# Patient Record
Sex: Female | Born: 1965
Health system: Southern US, Community
[De-identification: ages and names within clinical notes are randomized; demographics above are authoritative.]

## PROBLEM LIST (undated history)

## (undated) DIAGNOSIS — K3184 Gastroparesis: Secondary | ICD-10-CM

## (undated) DIAGNOSIS — D638 Anemia in other chronic diseases classified elsewhere: Secondary | ICD-10-CM

## (undated) DIAGNOSIS — E119 Type 2 diabetes mellitus without complications: Secondary | ICD-10-CM

## (undated) DIAGNOSIS — E0500 Thyrotoxicosis with diffuse goiter without thyrotoxic crisis or storm: Secondary | ICD-10-CM

## (undated) DIAGNOSIS — I10 Essential (primary) hypertension: Secondary | ICD-10-CM

## (undated) DIAGNOSIS — K589 Irritable bowel syndrome without diarrhea: Secondary | ICD-10-CM

## (undated) HISTORY — PX: ABDOMINAL HYSTERECTOMY: SHX81

## (undated) HISTORY — DX: Irritable bowel syndrome, unspecified: K58.9

## (undated) HISTORY — PX: PLANTAR FASCIA SURGERY: SHX746

## (undated) HISTORY — DX: Gastroparesis: K31.84

## (undated) HISTORY — DX: Type 2 diabetes mellitus without complications: E11.9

## (undated) HISTORY — DX: Thyrotoxicosis with diffuse goiter without thyrotoxic crisis or storm: E05.00

## (undated) HISTORY — PX: CATARACT EXTRACTION: SUR2

## (undated) HISTORY — DX: Anemia in other chronic diseases classified elsewhere: D63.8

## (undated) HISTORY — PX: CHOLECYSTECTOMY: SHX55

## (undated) HISTORY — PX: TUBAL LIGATION: SHX77

## (undated) HISTORY — PX: TONSILLECTOMY: SUR1361

## (undated) HISTORY — PX: OTHER SURGICAL HISTORY: SHX169

---

## 2000-09-09 ENCOUNTER — Emergency Department (HOSPITAL_COMMUNITY): Admission: EM | Admit: 2000-09-09 | Discharge: 2000-09-10 | Payer: Self-pay | Admitting: *Deleted

## 2001-05-11 ENCOUNTER — Emergency Department (HOSPITAL_COMMUNITY): Admission: EM | Admit: 2001-05-11 | Discharge: 2001-05-11 | Payer: Self-pay | Admitting: Internal Medicine

## 2001-06-10 ENCOUNTER — Emergency Department (HOSPITAL_COMMUNITY): Admission: EM | Admit: 2001-06-10 | Discharge: 2001-06-10 | Payer: Self-pay | Admitting: Emergency Medicine

## 2001-07-19 ENCOUNTER — Ambulatory Visit (HOSPITAL_COMMUNITY): Admission: RE | Admit: 2001-07-19 | Discharge: 2001-07-19 | Payer: Self-pay | Admitting: Internal Medicine

## 2001-07-19 ENCOUNTER — Encounter: Payer: Self-pay | Admitting: Internal Medicine

## 2001-07-26 ENCOUNTER — Encounter: Payer: Self-pay | Admitting: Internal Medicine

## 2001-07-26 ENCOUNTER — Ambulatory Visit (HOSPITAL_COMMUNITY): Admission: RE | Admit: 2001-07-26 | Discharge: 2001-07-26 | Payer: Self-pay | Admitting: Internal Medicine

## 2001-12-04 ENCOUNTER — Encounter: Admission: RE | Admit: 2001-12-04 | Discharge: 2001-12-04 | Payer: Self-pay | Admitting: Internal Medicine

## 2001-12-04 ENCOUNTER — Encounter: Payer: Self-pay | Admitting: Internal Medicine

## 2002-08-09 ENCOUNTER — Encounter: Payer: Self-pay | Admitting: *Deleted

## 2002-08-09 ENCOUNTER — Ambulatory Visit (HOSPITAL_COMMUNITY): Admission: RE | Admit: 2002-08-09 | Discharge: 2002-08-09 | Payer: Self-pay | Admitting: *Deleted

## 2002-09-11 ENCOUNTER — Inpatient Hospital Stay (HOSPITAL_COMMUNITY): Admission: RE | Admit: 2002-09-11 | Discharge: 2002-09-13 | Payer: Self-pay | Admitting: *Deleted

## 2003-12-27 ENCOUNTER — Emergency Department (HOSPITAL_COMMUNITY): Admission: EM | Admit: 2003-12-27 | Discharge: 2003-12-27 | Payer: Self-pay | Admitting: Emergency Medicine

## 2004-01-15 ENCOUNTER — Emergency Department (HOSPITAL_COMMUNITY): Admission: EM | Admit: 2004-01-15 | Discharge: 2004-01-16 | Payer: Self-pay | Admitting: *Deleted

## 2004-01-24 ENCOUNTER — Emergency Department (HOSPITAL_COMMUNITY): Admission: EM | Admit: 2004-01-24 | Discharge: 2004-01-24 | Payer: Self-pay | Admitting: Emergency Medicine

## 2004-01-25 HISTORY — PX: ESOPHAGOGASTRODUODENOSCOPY: SHX1529

## 2004-01-27 ENCOUNTER — Emergency Department (HOSPITAL_COMMUNITY): Admission: EM | Admit: 2004-01-27 | Discharge: 2004-01-28 | Payer: Self-pay | Admitting: Emergency Medicine

## 2004-01-31 ENCOUNTER — Inpatient Hospital Stay (HOSPITAL_COMMUNITY): Admission: AD | Admit: 2004-01-31 | Discharge: 2004-02-05 | Payer: Self-pay | Admitting: Family Medicine

## 2004-02-22 ENCOUNTER — Emergency Department (HOSPITAL_COMMUNITY): Admission: EM | Admit: 2004-02-22 | Discharge: 2004-02-22 | Payer: Self-pay | Admitting: Emergency Medicine

## 2004-03-06 ENCOUNTER — Ambulatory Visit: Payer: Self-pay | Admitting: Internal Medicine

## 2004-03-06 ENCOUNTER — Inpatient Hospital Stay (HOSPITAL_COMMUNITY): Admission: AD | Admit: 2004-03-06 | Discharge: 2004-03-12 | Payer: Self-pay | Admitting: Family Medicine

## 2004-03-22 ENCOUNTER — Emergency Department (HOSPITAL_COMMUNITY): Admission: EM | Admit: 2004-03-22 | Discharge: 2004-03-22 | Payer: Self-pay | Admitting: Emergency Medicine

## 2004-03-24 ENCOUNTER — Encounter (HOSPITAL_COMMUNITY): Admission: RE | Admit: 2004-03-24 | Discharge: 2004-04-23 | Payer: Self-pay | Admitting: Internal Medicine

## 2004-03-26 HISTORY — PX: OTHER SURGICAL HISTORY: SHX169

## 2004-03-27 ENCOUNTER — Ambulatory Visit (HOSPITAL_COMMUNITY): Admission: RE | Admit: 2004-03-27 | Discharge: 2004-03-27 | Payer: Self-pay | Admitting: Internal Medicine

## 2004-04-02 ENCOUNTER — Ambulatory Visit (HOSPITAL_COMMUNITY): Admission: RE | Admit: 2004-04-02 | Discharge: 2004-04-02 | Payer: Self-pay | Admitting: Internal Medicine

## 2004-04-06 ENCOUNTER — Observation Stay (HOSPITAL_COMMUNITY): Admission: EM | Admit: 2004-04-06 | Discharge: 2004-04-06 | Payer: Self-pay | Admitting: Emergency Medicine

## 2004-04-24 ENCOUNTER — Encounter (HOSPITAL_COMMUNITY): Admission: RE | Admit: 2004-04-24 | Discharge: 2004-05-24 | Payer: Self-pay | Admitting: Nephrology

## 2004-05-01 ENCOUNTER — Ambulatory Visit (HOSPITAL_COMMUNITY): Payer: Self-pay | Admitting: Oncology

## 2004-05-04 ENCOUNTER — Ambulatory Visit: Payer: Self-pay | Admitting: Internal Medicine

## 2004-05-21 ENCOUNTER — Ambulatory Visit (HOSPITAL_COMMUNITY): Payer: Self-pay | Admitting: Internal Medicine

## 2004-05-27 ENCOUNTER — Emergency Department (HOSPITAL_COMMUNITY): Admission: EM | Admit: 2004-05-27 | Discharge: 2004-05-27 | Payer: Self-pay | Admitting: Emergency Medicine

## 2004-05-28 ENCOUNTER — Encounter (HOSPITAL_COMMUNITY): Admission: RE | Admit: 2004-05-28 | Discharge: 2004-06-27 | Payer: Self-pay | Admitting: Nephrology

## 2004-06-02 ENCOUNTER — Emergency Department (HOSPITAL_COMMUNITY): Admission: EM | Admit: 2004-06-02 | Discharge: 2004-06-03 | Payer: Self-pay | Admitting: Emergency Medicine

## 2004-06-04 ENCOUNTER — Ambulatory Visit (HOSPITAL_COMMUNITY): Payer: Self-pay | Admitting: Nephrology

## 2004-06-19 ENCOUNTER — Ambulatory Visit: Payer: Self-pay | Admitting: Internal Medicine

## 2004-07-03 ENCOUNTER — Encounter (HOSPITAL_COMMUNITY): Admission: RE | Admit: 2004-07-03 | Discharge: 2004-08-02 | Payer: Self-pay | Admitting: Nephrology

## 2004-07-28 ENCOUNTER — Ambulatory Visit (HOSPITAL_COMMUNITY): Payer: Self-pay | Admitting: Oncology

## 2004-08-05 ENCOUNTER — Ambulatory Visit (HOSPITAL_COMMUNITY): Payer: Self-pay | Admitting: Nephrology

## 2004-08-05 ENCOUNTER — Encounter (HOSPITAL_COMMUNITY): Admission: RE | Admit: 2004-08-05 | Discharge: 2004-09-04 | Payer: Self-pay | Admitting: Nephrology

## 2004-09-10 ENCOUNTER — Encounter (HOSPITAL_COMMUNITY): Admission: RE | Admit: 2004-09-10 | Discharge: 2004-10-10 | Payer: Self-pay | Admitting: Nephrology

## 2004-10-08 ENCOUNTER — Ambulatory Visit (HOSPITAL_COMMUNITY): Payer: Self-pay | Admitting: Nephrology

## 2004-10-21 ENCOUNTER — Encounter (HOSPITAL_COMMUNITY): Admission: RE | Admit: 2004-10-21 | Discharge: 2004-11-20 | Payer: Self-pay | Admitting: Nephrology

## 2004-12-11 ENCOUNTER — Ambulatory Visit (HOSPITAL_COMMUNITY): Payer: Self-pay | Admitting: Nephrology

## 2004-12-11 ENCOUNTER — Encounter (HOSPITAL_COMMUNITY): Admission: RE | Admit: 2004-12-11 | Discharge: 2005-01-10 | Payer: Self-pay | Admitting: Oncology

## 2005-01-08 ENCOUNTER — Ambulatory Visit: Payer: Self-pay | Admitting: Internal Medicine

## 2005-01-15 ENCOUNTER — Encounter (HOSPITAL_COMMUNITY): Admission: RE | Admit: 2005-01-15 | Discharge: 2005-01-23 | Payer: Self-pay | Admitting: Nephrology

## 2005-02-03 ENCOUNTER — Encounter (HOSPITAL_COMMUNITY): Admission: RE | Admit: 2005-02-03 | Discharge: 2005-03-05 | Payer: Self-pay | Admitting: Oncology

## 2005-02-19 ENCOUNTER — Ambulatory Visit (HOSPITAL_COMMUNITY): Payer: Self-pay | Admitting: Nephrology

## 2005-03-17 ENCOUNTER — Encounter (HOSPITAL_COMMUNITY): Admission: RE | Admit: 2005-03-17 | Discharge: 2005-04-16 | Payer: Self-pay | Admitting: Nephrology

## 2005-04-07 ENCOUNTER — Ambulatory Visit: Payer: Self-pay | Admitting: Internal Medicine

## 2005-04-15 ENCOUNTER — Ambulatory Visit (HOSPITAL_COMMUNITY): Payer: Self-pay | Admitting: Nephrology

## 2005-04-30 ENCOUNTER — Encounter (HOSPITAL_COMMUNITY): Admission: RE | Admit: 2005-04-30 | Discharge: 2005-05-30 | Payer: Self-pay | Admitting: Oncology

## 2005-06-11 ENCOUNTER — Ambulatory Visit (HOSPITAL_COMMUNITY): Payer: Self-pay | Admitting: Nephrology

## 2005-06-11 ENCOUNTER — Encounter (HOSPITAL_COMMUNITY): Admission: RE | Admit: 2005-06-11 | Discharge: 2005-07-11 | Payer: Self-pay | Admitting: Oncology

## 2005-07-23 ENCOUNTER — Encounter (HOSPITAL_COMMUNITY): Admission: RE | Admit: 2005-07-23 | Discharge: 2005-08-22 | Payer: Self-pay | Admitting: Nephrology

## 2005-07-26 ENCOUNTER — Emergency Department (HOSPITAL_COMMUNITY): Admission: EM | Admit: 2005-07-26 | Discharge: 2005-07-26 | Payer: Self-pay | Admitting: Emergency Medicine

## 2005-08-20 ENCOUNTER — Ambulatory Visit (HOSPITAL_COMMUNITY): Payer: Self-pay | Admitting: Nephrology

## 2005-08-22 ENCOUNTER — Emergency Department (HOSPITAL_COMMUNITY): Admission: EM | Admit: 2005-08-22 | Discharge: 2005-08-23 | Payer: Self-pay | Admitting: Emergency Medicine

## 2005-09-03 ENCOUNTER — Encounter (HOSPITAL_COMMUNITY): Admission: RE | Admit: 2005-09-03 | Discharge: 2005-10-03 | Payer: Self-pay | Admitting: Oncology

## 2005-10-04 ENCOUNTER — Ambulatory Visit: Payer: Self-pay | Admitting: Internal Medicine

## 2005-10-15 ENCOUNTER — Encounter (HOSPITAL_COMMUNITY): Admission: RE | Admit: 2005-10-15 | Discharge: 2005-11-14 | Payer: Self-pay | Admitting: Oncology

## 2005-10-29 ENCOUNTER — Ambulatory Visit (HOSPITAL_COMMUNITY): Payer: Self-pay | Admitting: Nephrology

## 2005-11-19 ENCOUNTER — Encounter (HOSPITAL_COMMUNITY): Admission: RE | Admit: 2005-11-19 | Discharge: 2005-12-19 | Payer: Self-pay | Admitting: Nephrology

## 2005-12-20 ENCOUNTER — Encounter (HOSPITAL_COMMUNITY): Admission: RE | Admit: 2005-12-20 | Discharge: 2006-01-19 | Payer: Self-pay | Admitting: Oncology

## 2005-12-31 ENCOUNTER — Ambulatory Visit (HOSPITAL_COMMUNITY): Payer: Self-pay | Admitting: Nephrology

## 2006-01-31 ENCOUNTER — Encounter (HOSPITAL_COMMUNITY): Admission: RE | Admit: 2006-01-31 | Discharge: 2006-03-02 | Payer: Self-pay | Admitting: Nephrology

## 2006-02-24 ENCOUNTER — Ambulatory Visit (HOSPITAL_COMMUNITY): Payer: Self-pay | Admitting: Nephrology

## 2006-03-10 ENCOUNTER — Encounter (HOSPITAL_COMMUNITY): Admission: RE | Admit: 2006-03-10 | Discharge: 2006-04-09 | Payer: Self-pay | Admitting: Nephrology

## 2006-04-11 ENCOUNTER — Ambulatory Visit: Payer: Self-pay | Admitting: Internal Medicine

## 2006-04-21 ENCOUNTER — Ambulatory Visit: Payer: Self-pay | Admitting: Internal Medicine

## 2006-05-05 ENCOUNTER — Ambulatory Visit (HOSPITAL_COMMUNITY): Payer: Self-pay | Admitting: Nephrology

## 2006-05-05 ENCOUNTER — Encounter (HOSPITAL_COMMUNITY): Admission: RE | Admit: 2006-05-05 | Discharge: 2006-06-04 | Payer: Self-pay | Admitting: Oncology

## 2006-10-17 ENCOUNTER — Emergency Department (HOSPITAL_COMMUNITY): Admission: EM | Admit: 2006-10-17 | Discharge: 2006-10-17 | Payer: Self-pay | Admitting: Emergency Medicine

## 2007-03-15 ENCOUNTER — Emergency Department (HOSPITAL_COMMUNITY): Admission: EM | Admit: 2007-03-15 | Discharge: 2007-03-15 | Payer: Self-pay | Admitting: Emergency Medicine

## 2007-03-20 ENCOUNTER — Ambulatory Visit: Payer: Self-pay | Admitting: Gastroenterology

## 2007-10-19 ENCOUNTER — Ambulatory Visit: Payer: Self-pay | Admitting: Internal Medicine

## 2008-03-25 ENCOUNTER — Encounter: Payer: Self-pay | Admitting: Orthopedic Surgery

## 2008-04-03 DIAGNOSIS — K3184 Gastroparesis: Secondary | ICD-10-CM

## 2008-04-03 DIAGNOSIS — K5909 Other constipation: Secondary | ICD-10-CM

## 2008-04-03 DIAGNOSIS — N259 Disorder resulting from impaired renal tubular function, unspecified: Secondary | ICD-10-CM | POA: Insufficient documentation

## 2008-04-03 DIAGNOSIS — E0500 Thyrotoxicosis with diffuse goiter without thyrotoxic crisis or storm: Secondary | ICD-10-CM | POA: Insufficient documentation

## 2008-04-26 HISTORY — PX: CHOLECYSTECTOMY: SHX55

## 2008-09-02 ENCOUNTER — Telehealth (INDEPENDENT_AMBULATORY_CARE_PROVIDER_SITE_OTHER): Payer: Self-pay | Admitting: *Deleted

## 2008-10-23 ENCOUNTER — Emergency Department (HOSPITAL_COMMUNITY): Admission: EM | Admit: 2008-10-23 | Discharge: 2008-10-23 | Payer: Self-pay | Admitting: Emergency Medicine

## 2008-12-05 ENCOUNTER — Encounter (INDEPENDENT_AMBULATORY_CARE_PROVIDER_SITE_OTHER): Payer: Self-pay | Admitting: *Deleted

## 2008-12-05 ENCOUNTER — Ambulatory Visit: Payer: Self-pay | Admitting: Internal Medicine

## 2008-12-05 DIAGNOSIS — K219 Gastro-esophageal reflux disease without esophagitis: Secondary | ICD-10-CM | POA: Insufficient documentation

## 2009-09-12 ENCOUNTER — Encounter: Payer: Self-pay | Admitting: Urgent Care

## 2010-03-17 ENCOUNTER — Ambulatory Visit: Payer: Self-pay | Admitting: Internal Medicine

## 2010-03-24 ENCOUNTER — Telehealth (INDEPENDENT_AMBULATORY_CARE_PROVIDER_SITE_OTHER): Payer: Self-pay

## 2010-03-31 ENCOUNTER — Ambulatory Visit: Payer: Self-pay | Admitting: Internal Medicine

## 2010-04-03 ENCOUNTER — Telehealth (INDEPENDENT_AMBULATORY_CARE_PROVIDER_SITE_OTHER): Payer: Self-pay

## 2010-05-17 ENCOUNTER — Encounter: Payer: Self-pay | Admitting: *Deleted

## 2010-05-26 NOTE — Progress Notes (Signed)
Summary: change ppi  Phone Note Call from Patient Call back at Home Phone [phone]   Caller: Patient Summary of Call: pt called- protonix is not working, she stated it made if feel like the food is not going down. wants to know if she can try something else. please advise. per formulary, insurance will pay for prevacid, prilosec, zegerid and nexium. Initial call taken by: Burnadette Peter LPN,  December  9, 624THL 11:01 AM     Appended Document: change ppi ok; try zegrid 40 mg dail - rx #14 one daily;telephone report w weeks  Appended Document: change ppi pt aware, rx called in to [facility]

## 2010-05-26 NOTE — Assessment & Plan Note (Signed)
Summary: DROPPED OFF STOOL/SS   Pt returned one ifobt and it was negative.    Allergies: 1)  ! Codeine  Other Orders: Immuno-chemical Fecal Occult [id number])  Appended Document: DROPPED OFF STOOL/SS stool negative for blood. Needs a followup appointment in mid 2012 or as scheduled  Appended Document: DROPPED OFF STOOL/SS tried to call pt- LMOM  Appended Document: DROPPED OFF STOOL/SS pt aware  Appended Document: DROPPED OFF STOOL/SS reminder in computer

## 2010-05-26 NOTE — Medication Information (Signed)
Summary: Visual merchandiser   Imported By: Zeb Comfort 09/12/2009 16:09:23  _____________________________________________________________________  External Attachment:    Type:   Image     Comment:   External Document  Appended Document: RX Folder See 5/16 note.  Rx was called to [facility] in [place].  Let them know.  Thanks  Appended Document: RX Folder per [employer] [facility] [place], they got refills and pt has already picked up medication. she stated she didnt know why this was being sent again.

## 2010-05-26 NOTE — Assessment & Plan Note (Signed)
Summary: 1 YR F/U PER RMR/LAW   Visit Type:  Follow-up Visit Primary Care Provider:  Dr. Elonda Husky  Chief Complaint:  f/u.  History of Present Illness: 45 year old lady with idiopathic gastroparesis and constipation. History of Graves' disease. Chronic ongoing nausea/ rare emesis. GES abnormal previously. Previously on Reglan with this medication was stopped. She's not have any reflux symptoms at this time. She does have a bowel movement daily; she does take MiraLax 17 g orally daily and feels the need to take this agent daily,otherwise she is not likely to have a bowel movement . Incomplete colonoscopy in 2005 demonstrated a poor prep this was complemented by an air contrast barium enema which revealed no abnormalities.  Denies rectal bleeding. No family history of colon cancer colon polyps. She is followed primarily by Dr. Elonda Husky these days.   Current Medications (verified): 1)  Synthroid 75 Mcg Tabs (Levothyroxine Sodium) .... Once Daily 2)  Polyethylene Glycol 3350  Powd (Polyethylene Glycol 3350) .Marland KitchenMarland KitchenMarland Kitchen 17 Grams By Mouth Daily As Needed Constipation  Allergies (verified): 1)  ! Codeine  Past History:  Past Surgical History: Last updated: 04/03/2008 hysterectomy cholecystectomy tonsillectomy tubal ligation  Family History: Last updated: 28-Mar-2010 Father: deceased age 54   (Hit by train) Mother: Living age 33   healthy Siblings: 64 All living, basically healthy  Social History: Last updated: 2010/03/28 Marital Status: No Children: one Occupation: Agricultural consultant at [employer]  Past Medical History: Hypothyroidism Constipation  Family History: Father: deceased age 66   (Hit by train) Mother: Living age 37   healthy Siblings: 57 All living, basically healthy  Social History: Marital Status: No Children: one Occupation: Agricultural consultant at [employer] Signs:  Patient profile:   45 year old female Height:      61 inches Weight:      174 pounds BMI:     33.00 Temp:     98.4  degrees F oral Pulse rate:   72 / minute BP sitting:   128 / 82  (left arm) Cuff size:   large  Vitals Entered By: Waldon Merl LPN (November 22, 624THL 10:46 AM)  Physical Exam  General:  pleasant well-groomed lady in no acute distress Abdomen:  flat positive bowel sounds soft nontender no succussion splash no mass or hepatosplenomegaly  Impression & Recommendations: Impression: Symptomatic idiopathic gastroparesis. Not on any prokinetic therapy at this time.  Chronic constipation managed fairly well with MiraLax at this time; findings on including  colon imaging in 2005 reassuring. Recommendations continue MiraLax 17 g orally at bedtime to facilitate one bowel movement daily  Add domperidone 5 mg a.c. and h.s. to her regimen dispensed #120 with no refills. I discussed the off label nature of this therapy. I warned her about the side effects including breast tenderness.  She will let us know in one month how she is doing; we'll also do one I FOBT on stool.  Telephone followup.  Appended Document: Orders Update    Clinical Lists Changes  Problems: Added new problem of NAUSEA (ICD-787.02) Orders: Added new Service order of Est. Patient Level IV [id number]) - Signed

## 2010-05-26 NOTE — Progress Notes (Signed)
Summary: Request for Rx  Phone Note Call from Patient Call back at Home Phone [phone]   Caller: Patient Summary of Call: pt called- she is requesting something for reflux called into [facility]. please advise Initial call taken by: Burnadette Peter LPN,  November 29, 624THL 9:54 AM     Appended Document: Request for Rx Protonix 40 mg tablet one daily. Dispense #30; one year refill  Appended Document: Request for Rx Rx called to [facility] and left message on VM. LMOM for pt at [phone] that it has been called in.

## 2010-06-26 ENCOUNTER — Emergency Department (HOSPITAL_COMMUNITY)
Admission: EM | Admit: 2010-06-26 | Discharge: 2010-06-26 | Disposition: A | Discharge status: Home / Self Care | Payer: BC Managed Care – PPO | Attending: Emergency Medicine | Admitting: Emergency Medicine

## 2010-06-26 DIAGNOSIS — R109 Unspecified abdominal pain: Secondary | ICD-10-CM | POA: Insufficient documentation

## 2010-06-26 DIAGNOSIS — IMO0001 Reserved for inherently not codable concepts without codable children: Secondary | ICD-10-CM | POA: Insufficient documentation

## 2010-06-26 DIAGNOSIS — R197 Diarrhea, unspecified: Secondary | ICD-10-CM | POA: Insufficient documentation

## 2010-06-26 LAB — BASIC METABOLIC PANEL
BUN: 13 mg/dL (ref 6–23)
CO2: 29 mEq/L (ref 19–32)
Calcium: 9.9 mg/dL (ref 8.4–10.5)
Chloride: 102 mEq/L (ref 96–112)
Creatinine, Ser: 1.3 mg/dL — ABNORMAL HIGH (ref 0.4–1.2)
Glucose, Bld: 108 mg/dL — ABNORMAL HIGH (ref 70–99)
Sodium: 141 mEq/L (ref 135–145)

## 2010-07-24 ENCOUNTER — Other Ambulatory Visit: Payer: Self-pay

## 2010-07-24 MED ORDER — POLYETHYLENE GLYCOL 3350 17 GM/SCOOP PO POWD: 17.0000 g | Freq: Every day | ORAL | Status: DC | PRN

## 2010-07-27 ENCOUNTER — Other Ambulatory Visit: Payer: Self-pay | Admitting: Urgent Care

## 2010-07-27 DIAGNOSIS — Z760 Encounter for issue of repeat prescription: Secondary | ICD-10-CM

## 2010-07-27 MED ORDER — POLYETHYLENE GLYCOL 3350 17 GM/SCOOP PO POWD: 17.0000 g | Freq: Every day | ORAL | Status: AC | PRN

## 2010-08-14 ENCOUNTER — Other Ambulatory Visit: Payer: Self-pay | Admitting: Obstetrics & Gynecology

## 2010-08-14 DIAGNOSIS — Z139 Encounter for screening, unspecified: Secondary | ICD-10-CM

## 2010-08-21 ENCOUNTER — Ambulatory Visit (HOSPITAL_COMMUNITY)
Admission: RE | Admit: 2010-08-21 | Discharge: 2010-08-21 | Disposition: A | Discharge status: Home / Self Care | Payer: BC Managed Care – PPO | Source: Ambulatory Visit | Attending: Obstetrics & Gynecology | Admitting: Obstetrics & Gynecology

## 2010-08-21 DIAGNOSIS — Z1231 Encounter for screening mammogram for malignant neoplasm of breast: Secondary | ICD-10-CM | POA: Insufficient documentation

## 2010-08-21 DIAGNOSIS — Z139 Encounter for screening, unspecified: Secondary | ICD-10-CM

## 2010-09-08 NOTE — Assessment & Plan Note (Signed)
NAMEMarland Kitchen  [name] [name], [name] [name]            CHART#:  [id number]   DATE:  10/19/2007                       DOB:  02-21-1966   She called in about nausea, back in December 2008 and was having a hard  time when she bumped up her Protonix 40 mg twice daily.  She is now  taking it once daily.  She was last seen in office on 03/20/2007 for  atypical chest pain.  She has a history of gastroparesis with a very  abnormal gastric empty study in 2005 with 87% of trace remaining in  stomach at 90 minutes.  Having some nausea these days after eating lasts  for about 50 minutes, she does not vomit.  Has a heavy abdominal pain,  melena, rectal bleeding, and constipation; managed very well with the  daily dose of GlycoLax.  She has a history of chronic renal  insufficiency.  She has seen Dr. Lowanda Foster and Dr. Armando Gang.  Previously,  she was on Epogen but is no longer on that agent.  She tells me that  health department folks reported that her kidney status is doing well at  this time.  She is not diabetic.  She previously was on Reglan, but did  not really help her gastroparesis.  She consequently stopped taking it.  She never experienced any side effects with this agent.  Again, she is  status post cholecystectomy, prior EGD, and colonoscopy (incomplete),  but her contrast barium enema demonstrated no significant pathologies.  There is no history of GI malignancy in her family.   CURRENT MEDICATIONS:  See updated list.   ALLERGIES:  Codeine.   PHYSICAL EXAMINATION:  GENERAL:  She appears well.  VITAL SIGNS:  Weight 160, down 2 pounds from her last weight here,  height 5 feet 1 inch, temperature 98, BP 110/72, and pulse 76.  SKIN:  Warm and dry.  HEENT:  There is no scleral icterus.  CHEST:  Lungs clear to auscultation.  HEART:  Regular rate and rhythm without murmur, gallop, or rub.  BREAST EXAM:  Deferred.  ABDOMEN:  Flat.  Positive bowel sounds.  No succussion splash.  Soft and  nontender without  appreciable mass or organomegaly.   ASSESSMENT:  1. History of gastroesophageal reflux disease and gastroparesis.      Reflux symptoms well controlled on Protonix.  She has a transient      nausea after eating sometimes, discussed the nature of      gastroparesis and the need for multiple smaller low-fat meals and      three main meals daily.  We will provide her a gastroparesis diet.      She is to continue Protonix 40 mg orally daily.  2. She asked about things she could do to lose weight.  I encouraged      increased exercise and if she is going to spend any money on any      popular weight loss modalities but go with Weight Watchers.  Unless      something comes up, I plan to see this nice lady back in 1 year.   I told the patient to make sure that she hears that her thyroid is in  the therapeutic range as either at lower or high.  Thyroid status could  impact both constipation and gastric emptying.  Unless something comes  up, I plan to see this nice lady back in 1 year.       Bridgette Habermann, M.D.  Electronically Signed     RMR/MEDQ  D:  10/19/2007  T:  10/20/2007  Job:  [id number]   cc:   Dionisio Paschal

## 2010-09-08 NOTE — Assessment & Plan Note (Signed)
NAMEMarland Kitchen  DLILA, GALANIS            CHART#:  [id number]   DATE:  03/20/2007                       DOB:  [birth year]   PRIMARY CARE PHYSICIAN:  Angus G. McInnis, MD   CHIEF COMPLAINT:  Atypical chest pain.   SUBJECTIVE:  The patient is a 45 year old female with a history of GERD,  distant history of erosive reflux esophagitis and gastroparesis. She has  a history of hypothyroidism secondary to treatment for Grave's disease  as well as renal insufficiency and chronic anemia. She was last seen  April 11, 2004 by Dr. Gala Romney. She tells me she has not had a  creatinine checked recently. Last one she believes was almost a year  ago. She had previously been on Epogen shots after a bout of acute  interstitial nephritis. She developed chest pain which started Wednesday  approximately five days ago. She describes the chest pain as a constant  gassy pain that eases with belching. She tells me this is different from  the acid reflux she had previously. She denies any shortness of breath  or cough. Denies any dyspnea, denies any fever or chills. She denies any  anorexia. She does have some upper abdominal pain and post prandial  nausea. She tells me her most recent TSH has been quite some time ago,  but states that this was normal. She is followed by Dr. Everette Rank for  this. Her bowel movements have been normal, soft and brown without any  rectal bleeding or melena. She has had six hemoccult cards returned in  the past which have been negative. She has had EGD and colonoscopy as  well as barium enema without evidence of GI bleeding. She has had a  complete hysterectomy with a history of ovarian cysts. She is also  status post cholecystectomy, tonsillectomy, tubal ligation. She has a  history of Grave's disease status post ablation, now with  hypothyroidism; chronic constipation, acute interstitial nephritis with  renal insufficiency followed by Dr. Lowanda Foster.   Acute abdominal series with chest on  March 15, 2007, shows minimal  interstitial prominence at the bases, slightly increased from the prior  study. There is element of mild pulmonary congestion, nonspecific gas  bowel pattern without evidence for obstruction or free air.   CURRENT MEDICATIONS:  See the list from March 20, 2007.   ALLERGIES:  CODEINE CAUSES NAUSEA.   FAMILY HISTORY:  No known family history of colds, coughs, GI problems.   SOCIAL HISTORY:  The patient is single. She works in Charity fundraiser. She  denies any tobacco, alcohol or drug use.   REVIEW OF SYSTEMS:  See HPI.   OBJECTIVE:  VITAL SIGNS: Weight 162 pounds, height 61 inches,  temperature 97.7, blood pressure 100/60, pulse 84.  GENERAL: The patient is well-developed, well-nourished female in no  acute distress.  HEENT: Sclerae are  clear and anicteric. Conjunctivae pink. Oropharynx  pink and moist without any lesions.  CHEST: HEART: Regular rate and rhythm. Normal S1, S2. No murmurs, rubs  or gallops.  ABDOMEN: Positive bowel sounds in all four quadrants. No bruits  auscultated. Soft and nontender, nondistended without palpable mass or  hepatosplenomegaly. No rebound, tenderness or guarding.  EXTREMITIES: Without clubbing or edema bilaterally.  SKIN: Warm and dry without any rash or jaundice.   ASSESSMENT:  The patient is a 45 year old female with a history of  chronic gastroesophageal reflux disease who has not been on PPI  recently. She has been having atypical chest pain, some heartburn. I am  not entirely convinced that her symptoms are related to gastroesophageal  reflux disease. She does have chronic anemia with a history of renal  insufficiency and acute interstitial nephritis. She has a history of  hypothyroidism, status post treatment for Grave's disease.   She has had chronic anemia without evidence of GI bleeding.   PLAN:  1. Recheck CBC. If she has persistent anemia, would pursue anemia      profile.  2. Check creatinine.  3.  Begin Protonix 40 mg daily #31 with five refills.  4. Carafate 1 gram q.i.d. for two weeks, no refills.  5. She is going to call and if she has persistent symptoms would      proceed with further evaluation.       Vickey Huger, N.P.  Electronically Signed     Caro Hight, M.D.  Electronically Signed    KJ/MEDQ  D:  03/20/2007  T:  03/20/2007  Job:  [id number]   cc:   Angus G. Everette Rank, MD

## 2010-09-11 NOTE — Consult Note (Signed)
NAMEKATHARINA, FINE               ACCOUNT NO.:  [id number]   MEDICAL RECORD NO.:  [id number]          PATIENT TYPE:  INP   LOCATION:  A320                          FACILITY:  [facility]   PHYSICIAN:  R. Garfield Cornea, M.D. DATE OF BIRTH:  02/24/1966   DATE OF CONSULTATION:  03/07/2004  DATE OF DISCHARGE:                                   CONSULTATION   REFERRING PHYSICIAN:  Leonides Grills, M.D.   REASON FOR CONSULTATION:  Nausea and vomiting.   HISTORY OF PRESENT ILLNESS:  Ms. Maeby Luhrsen is a pleasant, 45 year old,  African American female admitted to the hospital on March 06, 2004, with  exacerbation of a two-month history of nausea and vomiting.  This lady was  admitted here approximately a month ago for similar symptoms.  She has  undergone extensive evaluation.  She was found to have abnormal-appearing  kidneys on ultrasound and elevated creatinine which ultimately led to a  nephrology consultation to transfer to [facility] where she underwent a kidney  biopsy.  She was found to have acute interstitial nephritis and was started  on prednisone.  She tells me that prednisone has worsened her nausea and  vomiting.   I do not see in the medical record the etiology for her nephritis.  She  tells me that she was told she took too many BC Powders, but she tells me  that she has taken no more than three BC Powders all of her life and has not  taken any acetaminophen or Advil.   This lady has frequent nausea and vomiting.  She has had chronic  constipation for years, but nausea and vomiting started two months ago.  Within 30 minutes after she eats a meal, she vomits.  She has not had any  associated abdominal pain, although she has some abdominal wall soreness  after repeated episodes of heaving.  Has not had a fever or chills.  She was  seen consultation by Dr. Laural Golden on February 01, 2004.  She has been  extensively evaluated from a GI standpoint.  Her gallbladder is out.  She  has  had numerous laboratory studies, including fasting cortisol, EGD,  abdominal ultrasound and CT scans of the abdomen and head, failing to  elucidate a cause of her symptoms.  She had plain films on this admission  which revealed a nonspecific bowel gas pattern and no evidence of bowel  obstruction, constipation, etc.  She does not have any chronic symptoms of  reflux symptoms, odynophagia, dysphagia or early satiety otherwise.  No  abdominal pain.  She has not moved her bowels much at all in the past two  months (but her net oral intake has been significantly diminished).   She has not had any melena or rectal bleeding.  She tells me she has lost 30  pounds in the past two months.   ADMISSION LABORATORIES:  White count 8.3, hemoglobin 8.9, hematocrit 26.8,  MCV 83.1, platelet count 583.  Sedimentation rate yesterday elevated at 110.  Potassium 3.1, BUN 8, creatinine 2.2.  Total bilirubin 0.2, alkaline  phosphatase somewhat elevated at  190, GOT 35, GPT 25, albumin 2.9.  Amylase  186, lipase 28.   PAST MEDICAL HISTORY:  Significant for:  1.  Chronic constipation.  2.  Hypothyroidism secondary to Grave's on replacement.  3.  Anemia felt to be secondary to chronic renal insufficiency.  4.  Interstitial nephritis as noted above.  5.  Status post laparoscopic cholecystectomy.  6.  History of abdominal hysterectomy for menorrhagia and uterine fibroids.   MEDICATIONS:  She has been taking Synthroid and Zofran recently.   ALLERGIES:  Allergic to CODEINE.   FAMILY HISTORY:  Negative for chronic GI or liver disease.  No history of  inflammatory bowel disease.   SOCIAL HISTORY:  No tobacco, alcohol or substance abuse.  She is employed in  a Education officer, museum.   REVIEW OF SYSTEMS:  As in the history of present illness.  No fever or  chills.  Weight loss as outlined above.   PHYSICAL EXAMINATION:  GENERAL APPEARANCE:  A very pleasant, alert,  conversant, 45 year old lady resting  comfortably.  VITAL SIGNS:  Temperature 97.5 degrees, pulse 90, respiratory rate 18, BP  94/39.  SKIN:  Warm and dry.  HEENT:  No scleral icterus.  Oral cavity with no lesions.  CHEST:  Lungs are clear to auscultation.  CARDIAC:  Regular rate and rhythm without murmur, rub or gallop.  ABDOMEN:  Nondistended.  Positive bowel sounds.  Entirely nontender abdomen  to palpation.  No appreciable mass or organomegaly.  EXTREMITIES:  No edema.   LABORATORY DATA:  Sodium 135, potassium 3.1, chloride 99, CO2 29, glucose  99, BUN 8, creatinine 2.2, calcium 8.7, albumin 2.9.   IMPRESSION:  Ms. Loura Back is a pleasant, 45 year old, African American lady with  hypothyroidism and renal insufficiency secondary to interstitial nephritis.  Readmitted to the hospital with exacerbation of a two-month history of  nausea and vomiting.  Her amylase is mildly elevated.  She really has not  had any abdominal pain, only abdominal wall tenderness in temporal  relationship to heaving.  Clinically she does not have pancreatitis.  Her  elevated amylase is likely secondary to decreased renal clearance and the  nonspecific nature of the enzyme elevation in a setting of other conditions  (i.e. nausea and vomiting).   She has longstanding chronic constipation which may be in part related to  thyroid dysfunction or an idiopathic GI motility disorder.   I wonder if the process producing her nephritis has not also unmasked an  underlying more diffuse gastrointestinal motility disorder producing delayed  gastric emptying.   My sense is that her nausea and vomiting is related more to delayed gastric  emptying than some other GI process.   RECOMMENDATIONS:  It would be worthwhile to go ahead and get a solid phase  nuclear gastric emptying study and see where we stand at this point in terms  of gastric emptying.  If it is delayed, will try her on some Reglan.  If she is unable to keep down the tracer, we may try Reglan  anyway, but I think it  would be good to get a baseline emptying study.  Might also consider  augmenting therapy with Zelnorm depending on the findings of the emptying  study.  If a TSH has not been checked recently, would suggest obtaining  those laboratories.   I would like to thank Leonides Grills, M.D., for allowing me to see this  nice lady.  Further recommendations to follow.     R. Jerilynn Mages  RMR/MEDQ  D:  03/07/2004  T:  03/07/2004  Job:  [id number]

## 2010-09-11 NOTE — H&P (Signed)
NAMEMADDISON, [name] [name] NO.:  [id number]   MEDICAL RECORD NO.:  [id number]          PATIENT TYPE:  INP   LOCATION:  A320                          FACILITY:  [facility]   PHYSICIAN:  Leonides Grills, M.D.DATE OF BIRTH:  [birth year]   DATE OF ADMISSION:  03/06/2004  DATE OF DISCHARGE:  [facility]                                HISTORY & PHYSICAL   CHIEF COMPLAINT:  Vomiting.   HISTORY OF PRESENT ILLNESS:  This is a 45 year old female who was admitted  approximately a month ago with a similar problem.  At that time, the patient  was found to have a mildly elevated creatinine and elevated sedimentation  rate.  It was felt at that time that time that her problem was related to a  kidney disorder.  She was transferred to [facility] where she  underwent a kidney biopsy which was consistent with acute interstitial  nephritis.  The patient was discharged home on Synthroid, prednisone taper,  Nexium and Zofran.  She was discharged on February 12, 2004.  The patient  again presented to me on February 26, 2004, complaining of protracted  vomiting.  She stated that after she was discharged, she was doing well for  approximately three or four days and then vomiting recurred.  She has been  unable to keep anything down since.  At that time, the patient was treated  with IM Phenergan, p.o. Zofran and Aciphex and was followed by Dr. Lowanda Foster  the follow week.  At the time I saw her, her hemoglobin was 9.8 and her  creatinine was 2.1 with a BUN of 9, which was essentially unchanged from her  discharge numbers.  The patient has continued to have vomiting.  Since I saw  her on February 26, 2004, she has lost another 7 pounds.  The patient  originally weighed 154 pounds on January 09, 2004, and is 132 pounds  today.  She is admitted for control of her vomiting and further workup.   ALLERGIES:  She states an allergy to CODEINE.   MEDICATIONS:  She currently takes Synthroid 150 mcg.  She has  been on Zofran  without relief.   REVIEW OF SYSTEMS:  She denies chest pain, shortness of breath and cough.  She does state she has not had a bowel movement in seven days, but unable to  really keep anything down of significance during that time.  The patient  states she has been urinating regularly.   SOCIAL HISTORY:  She denies smoking, alcohol use or substance abuse.  She is  a hardworking lady at a Education officer, museum.   PHYSICAL EXAMINATION:  GENERAL APPEARANCE:  A well-developed female who  appears weak, but is smiling.  VITAL SIGNS:  Her temperature is 99.8 degrees, pulse is 88 and regular,  respirations are 20 and unlabored and blood pressure is 110/70.  HEENT:  TMs are normal.  Pupils equal and reactive to light and  accommodation.  Oropharynx benign.  NECK:  Supple without JVD, bruit or thyromegaly.  LUNGS:  Clear in all areas.  HEART:  Regular sinus rhythm  without murmur, rub or gallop.  ABDOMEN:  Soft and nontender.  There are positive bowel sounds.  EXTREMITIES:  Without cyanosis, clubbing or edema.  NEUROLOGIC:  Grossly intact.   ASSESSMENT:  1.  Protracted vomiting.  2.  History of acute interstitial nephritis.  3.  History of renal insufficiency secondary to nephritis.  4.  Anemia ostensibly secondary to nephritis.  5.  Hypothyroidism.     Will   WMM/MEDQ  D:  03/06/2004  T:  03/06/2004  Job:  [id number]

## 2010-09-11 NOTE — Op Note (Signed)
[name] [name], [name] [name]               ACCOUNT NO.:  [id number]   MEDICAL RECORD NO.:  [id number]          PATIENT TYPE:  AMB   LOCATION:  DAY                           FACILITY:  [facility]   PHYSICIAN:  R. Garfield Cornea, M.D. DATE OF BIRTH:  09-22-1965   DATE OF PROCEDURE:  03/27/2004  DATE OF DISCHARGE:                                 OPERATIVE REPORT   PROCEDURE:  Attempted colonoscopy (diagnostic flexible sigmoidoscopy).   INDICATION FOR PROCEDURE:  The patient is a 45 year old lady with  gastroparesis and chronic constipation, who was doing very well on Reglan  and Zelnorm recently until she noted some blood per rectum.  She has not had  her lower GI tract imaged.  Colonoscopy is now being done.  This approach  has been discussed with the patient at length, potential risks, benefits,  and alternatives have been reviewed.  Please see my dictated consultation  note and my handwritten interim updated note for more information.   PROCEDURE NOTE:  O2 saturation, blood pressure, pulse, and respiration were  monitored throughout the entirety of the procedure.   CONSCIOUS SEDATION:  Versed 4 mg IV, Demerol 100 mg IV in divided doses.   INSTRUMENT USED:  Olympus video chip system.   FINDINGS:  Digital exam revealed no abnormalities.   Endoscopic findings:  Unfortunately, the prep was poor.  I got a good look  at the rectum.  Examination of the rectal mucosa, including retroflexed of  the anal verge, revealed internal hemorrhoids.   Colon:  The colonic mucosa was surveyed up to 40 cm.  There was quite a bit  of formed stool and viscous liquid stool in this segment of the GI tract and  it appeared to be worse upstream, which precluded further attempts at  complete examination today.  The colonic mucosa which was seen to 40  appeared normal, but exam was severely compromised by the poor prep.  The  patient tolerated the brief procedure well, was reacted in endoscopy.   IMPRESSION:  1.   Internal hemorrhoids, otherwise normal rectum.  2.  Poor prep precluded complete examination of the colon.  No gross      abnormalities up to 40 cm.  Exam compromised by poor prep.   RECOMMENDATIONS:  1.  Hemorrhoid literature provided to Ms. Inge.  2.  Give her some Anusol suppositories empirically one per rectum at bedtime      x10 days.  3.  Proceed with air contrast barium enema to image the more proximal colon      not seen today.  Will plan for that study next week.  4.  Further recommendations to follow.     Elvera Bicker   RMR/MEDQ  D:  03/27/2004  T:  03/27/2004  Job:  [id number]   cc:   Leonides Grills, M.D.  [address] [place] [id number]  Fax: [phone]

## 2010-09-11 NOTE — Discharge Summary (Signed)
   [name], [name]                         ACCOUNT NO.:  [id number]   MEDICAL RECORD NO.:  [id number]                   PATIENT TYPE:  INP   LOCATION:  A426                                 FACILITY:  [facility]   PHYSICIAN:  Benedetto Goad, M.D.                DATE OF BIRTH:  [birth year]   DATE OF ADMISSION:  09/11/2002  DATE OF DISCHARGE:  09/13/2002                                 DISCHARGE SUMMARY   PROCEDURE:  Total abdominal hysterectomy.   DISPOSITION:  The patient is to follow up in four days' time for removal of  the staples from the Pfannenstiel incision.  She is given a copy of standard  discharge instructions.   LABORATORY DATA:  Pertinent laboratory studies show admission hemoglobin and  hematocrit of 11.4/34.7 with a negative hCG, B-positive blood type.  Postoperative day #1, 10.5/31.9 with a white count of 10.4.  Postoperative  day #2, no change in blood indices.   DISCHARGE MEDICATIONS:  Tylox, #30, for pain relief.   HOSPITAL COURSE:  See previous dictations.  The TAH was performed without  complications on Sep 11, 2002.  Postoperatively, the patient did very well.  She was ambulatory on postoperative day #1, and was able to void after  removal of the Foley catheter.  She was able to advance her diet.   On postoperative day #2, on evaluation, abdomen was noted to be soft,  nontender, nondistended.  JP drain was removed.  The patient was passing  flatus and tolerating a regular general diet as well as p.o. narcotics for  pain relief.  She was discharged to home on Sep 13, 2002.                                               Benedetto Goad, M.D.    DC/MEDQ  D:  09/18/2002  T:  09/18/2002  Job:  [id number]

## 2010-09-11 NOTE — Consult Note (Signed)
[name], [name]               ACCOUNT NO.:  [id number]   MEDICAL RECORD NO.:  [id number]          PATIENT TYPE:  INP   LOCATION:  A339                          FACILITY:  [facility]   PHYSICIAN:  Alison Murray, M.D.DATE OF BIRTH:  06/30/1965   DATE OF CONSULTATION:  02/02/2004  DATE OF DISCHARGE:                                   CONSULTATION   REASON FOR CONSULTATION:  Renal insufficiency.   [name] [name] is a 45 year old, African-American with past medical history of  Grave's disease, history of anemia, history of hypothyroidism, history of  vaginal bleeding thought to be secondary to a fibroid uterus, status post  hysterectomy.  Presently came through the emergency room because of  continuous abdominal pain, some nausea and vomiting for about 3 weeks.  According to the patient, because of this recurrent nausea, vomiting, she  has been at different places without much success; hence, she ended up in  emergency room.  Recently, the patient was seen at the emergency room at  [facility], and also she was at [facility] where she was found to  have hypothyroidism.  She was started on Synthroid and discharged home to  come [name] with above complaints.  According to the patient, there is no  history of diabetes, no history of kidney stone, no history of renal  insufficiency; however, she stated that during her previous examination, she  was found to have enlarged kidneys.  This moment, except nausea, vomiting,  the patient offers no complaint.  She denies any urgency, frequency, and  also no history of urinary tract infections.   PAST MEDICAL HISTORY:  1.  History of pancreas disease 3-4 years ago where she was treated with      radiation at [facility].  2.  History of uterine bleeding thought to be secondary to fibroids status      post hysterectomy.  3.  History of anemia of unknown etiology.  4.  History of hypothyroidism.  5.  History of recurrent nausea and vomiting of  unknown etiology.   SOCIAL HISTORY:  No history of smoking.  No history of alcohol abuse.   ALLERGIES:  She is allergic to CODEINE.   MEDICATIONS:  1.  IV fluids at 125 mL/hr.  2.  Synthroid 50 mcg IV daily.  3.  Protonix 40 mg IV q.12h.  4.  Medications p.r.n. such as Phenergan and also Demerol, and she is also      on Xanax.   FAMILY HISTORY:  Mother has history of arthritis, but there is no history of  renal insufficiency or history of kidney stone.   REVIEW OF SYSTEMS:  Overall feels better except nausea and vomiting.  No  shortness of breath, no dizziness, no lightheadedness.  Appetite is poor.  She denies any urgency, frequency.   PHYSICAL EXAMINATION:  VITAL SIGNS:  Temperature 97.9, blood pressure 92/58,  pulse 85, respiratory rate 18.  HEENT:  No conjunctival pallor.  Nonicterus.  Oral mucosa seems to be dry.  NECK:  Supple, no JVD.  CHEST:  Clear to auscultation, no rales, no rhonchi, no egophony.  HEART:  Regular rate and rhythm.  No murmur.  ABDOMEN:  Soft, positive bowel sounds.  EXTREMITIES:  No edema.   Her blood work from yesterday:  Her hemoglobin was 7.4, hematocrit 22.6,  white blood cell count 20.6.  Today, her hemoglobin is 9.7, hematocrit 29.  She has platelets of __________.  She has lymphocytes of __________ and  neutrophils 10.3.  Her sedimentation rate is 124.  Retic count is 1.2.  Sodium is 136, potassium 3.4, BUN 9, creatinine 2.5.  Her creatinine seems  to be increasing, as September 22, it was 1.8, then it went up to 2.  On  October 7, it was 2.2.  September 2, her creatinine was 1, and her BUN was  3.  The patient's other blood work, her total protein from September 30, was  9.1, albumin 3.1, calcium 9.1.  Presently albumin and calcium seems to be  decreasing.  Her PTH is up to 1.886.  Her iron saturation is 8% and ferritin  is 455.  Her UA:  Specific gravity is 1.01, ketones trace, protein is trace  at __________ mg/dl.  She has trace blood,  trace leukocyte, and she has few  bacteria.   ASSESSMENT:  1.  Renal insufficiency.  This seems to be accurate, as her creatinine about      a month ago was 1, and presently her BUN and creatinine seems to be      increasing.  The etiology for this increase in BUN and creatinine not      clear at this moment.  Possibly, prerenal syndrome to be entertained.      However, in a patient who has nephromegaly, as her ultrasound of the      kidney showed left kidney 13 and right kidney 12.3, interstitial      nephritis should also be entertained.  However, at this moment, the      patient does not seem to have eosinophilia.  2.  Nephromegaly.  The etiology not clear in a young person with some      proteinuria and severe anemia, the differential diagnosis should include      amyloidosis at this moment primary and also human immunodeficiency      virus, nephropathy, and diabetic nephropathy, and interstitial      nephritis.  3.  Anemia.  It could be part of the above.  At this moment, need to rule      out iron deficiency anemia.  The patient had colonoscopy and no sign of      bleeding.  Previously, however, the patient has had bleeding from her      uterus.  She is status post a hysterectomy.  4.  Nausea, vomiting.  Not sure whether this is a part of vasculitis.  5.  History of hypothyroidism.  She is on Synthroid.   RECOMMENDATIONS:  We will do any complement.  We will do hepatitis B surface  antigen, hepatitis C.  We will check human immunodeficiency virus.  We will  send also for urine protein electrophoresis.  I agree with hydration.  We  will follow patient with you.     Bely   BB/MEDQ  D:  02/02/2004  T:  02/02/2004  Job:  [id number]

## 2010-09-11 NOTE — Discharge Summary (Signed)
[name] [name], [name] [name]               ACCOUNT NO.:  [id number]   MEDICAL RECORD NO.:  [id number]          PATIENT TYPE:  INP   LOCATION:  A339                          FACILITY:  [facility]   PHYSICIAN:  Leonides Grills, M.D.DATE OF BIRTH:  [birth year]   DATE OF ADMISSION:  01/31/2004  DATE OF DISCHARGE:  10/12/2005LH                                 DISCHARGE SUMMARY   DISCHARGE DIAGNOSES:  1.  Protracted vomiting.  2.  Hypothyroidism.  3.  Chronic renal insufficiency.  4.  Anemia.  5.  Iron-deficiency anemia.   HOSPITAL COURSE:  This is a 45 year old female who was admitted with a 2-  week history of protracted vomiting. She had been seen in the ER several  times, received IV fluids and antiemetics with continued vomiting. The  patient also has documented 11-pound weight loss in the last 3 weeks in my  chart. She was admitted to the floor, begun on IV fluids. She was initially  found to have a hemoglobin of 7.4, white count 21.6. Anemia profile showed  low iron, low total iron binding capacity, low saturations with normal B12,  folates, and high ferritins. The patient also at the time of admission was  noted to have a BUN of 10 and creatinine 2.2. She was seen in consultation  by GI as well as nephrology. A 24-hour urine was collected. She underwent  hepatitis screens which were negative. Her TSH was 32. This was consistent  with her inability to keep her medications down for several weeks. She had a  normal cortisol level of 20. Serum pregnancy was negative. She had a normal  C3/C4 complements. Her urinalysis showed a moderate amount of protein. Urine  culture was not significant. The patient was transfused with improvement in  her hemoglobin. Incidentally, she had a sed rate of 125 and was felt this  could possibly be an immunologic problem. Her ANA and rheumatoid factor were  negative. The patient underwent hemoglobin electrophoresis which was  negative. Gammaglobulin was slightly  high at 26.5. Glycosylated hemoglobin  A1c was 5.7. The patient had a CT of the brain which was negative. Acute  abdominal series suggested gastroenteritis but no obstruction. CT of the  abdomen and pelvis was negative. The patient was seen also in consultation  by Dr. Tressie Stalker for hematology/oncology. The patient underwent EGD without  significant findings. HIV was negative. The patient is continuing to have  problems tolerating p.o. I am most concerned that her problem was associated  with her kidney disease. She was transferred to the nephrology service at  [facility] where incidentally she was found to have an acute nephritis. The  patient's creatinine at the time of discharge was 2.9. She had elevated  protein in her urine which was 279 mg, normal being 50 to 100. The patient's  LFTs were basically normal, although ALP was slightly up at 121 with upper  limits of normal being 117. Protime was slightly high at 14.9 with normal  INR. Hemoglobin at the time of transfer was 10.1.     Will   WMM/MEDQ  D:  03/09/2004  T:  03/09/2004  Job:  [id number]

## 2010-09-11 NOTE — Op Note (Signed)
[name] [name], [name] [name]               ACCOUNT NO.:  [id number]   MEDICAL RECORD NO.:  [id number]          PATIENT TYPE:  INP   LOCATION:  [id number]                          FACILITY:  [facility]   PHYSICIAN:  Hildred Laser, M.D.    DATE OF BIRTH:  1965/05/22   DATE OF PROCEDURE:  01/31/2004  DATE OF DISCHARGE:                                 OPERATIVE REPORT   PROCEDURE PERFORMED:  Esophagogastroduodenoscopy.   ENDOSCOPIST:  Hildred Laser, M.D.   INDICATIONS FOR PROCEDURE:  [name] is a 45 year old African-American  female with a five-week history of nausea and vomiting, sporadic upper  abdominal pain, associated weight loss and profound anemia.  There is no  evidence of GI bleed, though.  She is undergoing diagnostic  esophagogastroduodenoscopy.  She also has elevated serum creatinine and  slightly large kidneys on ultrasonography a couple of weeks ago.   The procedure was reviewed with the patient and informed consent was  obtained.   PREOP MEDICATIONS:  Cetacaine spray for pharyngeal topical anesthesia,  Demerol 50 mg IV, Versed 5 mg IV.   DESCRIPTION OF PROCEDURE:  Procedure performed in endoscopy suite.  The  patient's vital signs and oxygen saturations were monitored during the  procedure and remained stable. The patient was placed in left lateral  position.  The Olympus video scope was passed via oropharynx without any  difficulty to the esophagus.   Esophagus:  The mucosa of the esophagus was normal throughout.  Squamocolumnar junction was normal.   Stomach:  It was empty and distended very well with insufflation.  Folds of  the proximal stomach were normal.  Examination of mucosa of gastric body,  antrum, pyloric channel as well as angularis, fundus and cardia was normal.   Duodenum:  Bulbar mucosa was normal.  There were two small polyps in the  post bulbar area, the larger one along the posterior wall was about 3 to 4  mm and the other one was smaller.  Both of these were  ablated by cold  biopsy.  Scope was passed to the second part of duodenum and the mucosa and  folds were normal.  Endoscope was withdrawn.  The patient tolerated the  procedure well.   Incidental finding of two small bulbar polyps that were ablated by cold  biopsy.  No lesion seen on this study to account for patient's persistent  nausea and vomiting and epigastric pain.   RECOMMENDATIONS:  Head CT without contrast.  Please note that her creatinine  has gone up to 2.5 and sed rate is 125.  Will proceed with ANA, RF, SPEP,  and IEP.  Will also request nephrology consultation as discussed with Dr. Eddie Candle.     [name]   NR/MEDQ  D:  02/02/2004  T:  02/02/2004  Job:  [id number]   cc:   Leonides Grills, M.D.  [address] [place] [address]  Fax: [phone]

## 2010-09-11 NOTE — H&P (Signed)
[name], [name] [name] NO.:  [id number]   MEDICAL RECORD NO.:  [id number]                  PATIENT TYPE:   LOCATION:                                       FACILITY:   PHYSICIAN:  [name] [name], M.D.                DATE OF BIRTH:   DATE OF ADMISSION:  09/11/2002  DATE OF DISCHARGE:                                HISTORY & PHYSICAL   This is a 45 year old gravida 1, para 1 who is admitted for total abdominal  hysterectomy.  The patient's chief complaint is that of menorrhagia,  irregular menses, anemia due to blood loss and leiomyomatous uteri.  She  does desire to retain ovarian functioning following the procedure.  The  patient's complaints are specifically those associated with menses.  She has  no history of any problems with the adnexa.   The patient was noted to have been seen in the office on August 07, 2002  complaining of heavy bleeding at that time x3 weeks duration.  She was noted  to have a hemoglobin of 8 at that time.  The patient has subsequently been  placed on iron replacement therapy as well as Provera suppressive therapy  taking 5 mg p.o. daily.  Since that point in time, she has had near  cessation of bleeding with improvement of her hemoglobin to 10.5.   PAST MEDICAL HISTORY:  She is noted to be hypothyroid.  She does see Dr.  Ronnald [name] for thyroid replacement therapy currently taking Synthroid.  The  patient has one prior vaginal delivery.  She declines trial of oral  contraceptives.  She has NSAIDs without any improvement in her bleeding  pattern.   She states she is allergic to CODEINE which gives her nausea and vomiting.   PHYSICAL EXAMINATION:  GENERAL:  She is noted to be in no acute distress,  healthy young black female.  VITAL SIGNS:  Weight 159, height 5 feet 2 inches, blood pressure 130/79,  pulse 80, respiratory rate 20.  HEENT:  Negative.  No adenopathy.  NECK:  Supple.  Thyroid is not palpable.  LUNGS:  Clear.  CARDIOVASCULAR:  Regular  rate and rhythm.  ABDOMEN:  Soft and nontender.  No surgical scars are identified.  The pelvic  mass is easily palpable, about 14 week size, greater on the right side of  the umbilicus than on the left.  EXTREMITIES:  Normal.  PELVIC:  Normal external genitalia.  No lesions or ulcerations identified.  Cervix likely noted to be without lesions.  Bimanual examination reveals a  globular irregular contour 14 week size uterus.   LABORATORY DATA:  Sonogram has revealed uterus with multiple fibroids,  normal ovaries bilaterally.  TSH obtained August 08, 2002 normal at 2.369.  Hemoglobin 10.9, hematocrit 35.0.   ASSESSMENT:  The patient with leiomyomatous uteri.  These have been present  on previous office visits but have just become recently  symptomatic enough  that the patient believes they warrant surgical intervention.  She is  admitted at this time for total abdominal  hysterectomy.  Risks and benefits  of this surgery discussed with the patient on preoperative visits.  The  patient is not desirous of anymore children.  Thus, will proceed with  definitive surgical management to consist of total abdominal hysterectomy.                                               [name] [name], M.D.    DC/MEDQ  D:  09/10/2002  T:  09/10/2002  Job:  [id number]

## 2010-09-11 NOTE — Op Note (Signed)
[name] [name], [name]                         ACCOUNT NO.:  [id number]   MEDICAL RECORD NO.:  [id number]                   PATIENT TYPE:  INP   LOCATION:  A426                                 FACILITY:  [facility]   PHYSICIAN:  Benedetto Goad, M.D.                DATE OF BIRTH:  [birth year]   DATE OF PROCEDURE:  09/11/2002  DATE OF DISCHARGE:                                 OPERATIVE REPORT   PREOPERATIVE DIAGNOSES:  1. Menorrhagia.  2. Dysmenorrhea.  3. Irregular menses.  4. Abnormal uterine bleeding resulting in anemia.  5. Uterine fibroids.   PROCEDURE:  Total abdominal hysterectomy.   SURGEON:  Benedetto Goad, M.D.   ESTIMATED BLOOD LOSS:  350 mL.   COMPLICATIONS:  None.   SPECIMENS:  For permanent section only.   DRAINS:  Foley catheter as well as JP catheter in the subcutaneous space.   ANESTHESIA:  General endotracheal anesthesia.   FINDINGS:  Findings at time of surgery include a multilobular uterus with  several leiomyomas identified.  Atrophic-appearing ovaries bilaterally.   DESCRIPTION OF PROCEDURE:  The patient was taken to the operating room and  vital signs were stable.  The patient underwent uncomplicated induction of  general endotracheal anesthesia.  She was then prepped and draped in the  usual sterile manner.  A sharp knife was used to incise a Pfannenstiel  incision through the skin, dissected down to the fascial plane utilizing a  sharp knife, cauterizing all bleeders along the way.  The fascia was then  incised in a transverse curvilinear manner utilizing the Mayo scissors while  sharply dissecting off the underlying rectus muscle.  The fascial edges were  then grasped using straight Kocher clamps and the fascia was dissected off  the underlying rectus muscle superiorly and inferiorly.  The rectus muscle  was then bluntly separated, peritoneal cavity atraumatically bluntly  entered, peritoneal incision extended superiorly and inferiorly.  The  bladder  was palpated to avoid any injury.  A Balfour retaining retractor was  then placed.  Positioning is confirmed to be proper.  The bladder blade is  also replaced, and three moist packs are used to mobilize bowel out of the  operative field.  Long straight Kocher clamps are then used to grasp the  specimen at the junction of the round ligament and the fallopian tube with  the uterus itself.  This allows manipulation throughout the operative  procedure.  Initially the round ligament on the right is transected  utilizing a 0 Vicryl in a Heaney fashion.  This allows the utero-ovarian  ligament to be skeletonized, doubly clamped and cut, then doubly ligated,  first with a free tie of 0 Vicryl, followed by a suture ligature of 0  Vicryl.  Likewise, the same is performed on the left with the round ligament  transected, secured with 0 Vicryl in a Heaney fashion.  The utero-ovarian  ligament skeletonized, doubly  clamped and secured with 0 Vicryl free ties,  followed by suture ligature of 0 Vicryl in a Heaney fashion.  This then  allows me to skeletonize the uterine vessels bilaterally utilizing sharp  dissection and continue across the anterior lower uterine segment, creating  a bladder flap from the vesicouterine fold.  This is then sharply and  bluntly dissected distal to the uterus and down to the level of the cervix,  which then places it out of our operative site.  A Kelly clamp is placed on  the right uterine vessel to control back bleeding, followed by curved Heaney  clamp, followed by simple ligature of 0 Vicryl.  The left uterine vessels  secured in a likewise manner.  This then brought me down to the level of the  cardinal ligaments.  At this point in time great care was taken to place all  clamps as close as possible to the cervix to avoid any lateral injury to  bladder or ureters.  Each of the cardinal ligaments was secured on each side  with straight Heaney clamps, followed by suture  ligature of 0 Vicryl in a  Heaney fashion.  Another clamp is placed to control and secure the  uterosacral ligaments.  I was then able to place a curved Heaney clamp on  the vaginal angle on the right, followed by suture ligature of 0 Vicryl in a  Heaney fashion.  The uterosacral ligament on the left is likewise secured,  followed by 0 Vicryl in a Heaney fashion.  These are tied for later  inspection.  I was able to transect across the uppermost portion of the  vaginal cuff and remove the specimen with the cervix noted to be intact and  contained with the specimen.  Irrigation was then performed of the vaginal  cuff.  The vaginal cuff is closed utilizing 0 Vicryl sutures in figure-of-  eight fashion.  Copious irrigation is then performed in the pelvic cavity to  assure hemostasis.  Sponge and instrument counts are correct after removal  of the packs.  The Balfour self-retaining retractor is placed.  The large  bowel is placed within the pelvic cavity.  The peritoneal edges grasped  using Kelly clamps.  The peritoneum is closed with a continuous running 0  chromic suture.  Rectus muscles reapproximated with 0 chromic suture.  The  fascia is closed with a continuous running #1 PDS suture.  A JP drain is  placed in the subcutaneous space with a separate exit wound.  Three  horizontal mattress sutures used to reapproximate the skin.  The skin is  then completely closed utilizing skin staples.  Bupivacaine 0.5% plain 30 mL  injected subcutaneously to facilitate postop analgesia.  The patient  tolerated the procedure well, extubated, taken to the recovery room,  continuing to drain clear yellow urine.  Operative findings discussed with  the patient's waiting family.                                               Benedetto Goad, M.D.    DC/MEDQ  D:  09/12/2002  T:  09/13/2002  Job:  [id number]

## 2010-09-11 NOTE — Consult Note (Signed)
NAMESHATONYA, STREEPER               ACCOUNT NO.:  [id number]   MEDICAL RECORD NO.:  [id number]          PATIENT TYPE:  INP   LOCATION:  A339                          FACILITY:  [facility]   PHYSICIAN:  Hildred Laser, M.D.    DATE OF BIRTH:  August 09, 1965   DATE OF CONSULTATION:  02/01/2004  DATE OF DISCHARGE:                                   CONSULTATION   REASON FOR CONSULTATION:  Nausea, vomiting and anemia.   HISTORY OF PRESENT ILLNESS:  Jeannee is a 45 year old African American  female who has not been feeling well for about five or six weeks.  Her  symptoms began with nausea and vomiting.  She has had upper abdominal pain  on a few occasions which she attributes to vomiting and heaving.  She  recalled that she was seen in the emergency room at [facility] about a month ago,  and told to be hypothyroid and given a Synthroid prescription, but she has  not been able to keep it down.  She was tried on Prevacid and Phenergan by  Dr. Orson Ape without symptomatic improvement.  On one occasion, she was seen  at [facility],  but she has been in this facility three different  times.  She was here on December 27, 2003.  At that time, her WBC was 8, H&H  was 9.9 and 28.7.  Platelet count was 382,000.  MCV was normal at 87.3.  Her  creatinine was 1, and BUN was 3.  Her calcium was normal.  She continued to  have multiple episodes of vomiting just about every day.  She did not  experience hematemesis, melena or rectal bleeding.  Gradually, she had noted  progressive weakness.  Prior to admission, she developed postural  lightheadedness, and she felt like she was going to pass out.  She also  would experience dry cough associated with transient headache.  She has not  had any skin rash, joint pain, or visual symptoms.  She has not had any  dysuria or hematuria.  She also has not had any vaginal discharge or  bleeding.  She has chronic constipation.  She has been using laxatives  usually once a week.   She had not had a BM in nine days, and she was given  an enema with good results in the hospital.   She came back to the ER on January 16, 2004.  Her lab studies revealed her  H&H of 9.2 and 27.  Her electrolytes were normal.  Her BUN and creatinine  was 1.8.  She returned on January 24, 2004.  She had an abdominal pelvic  CT which revealed no abnormality to her liver bile duct or pancreas.  Her  kidneys were felt to be slightly enlarged, but there was no hydronephrosis,  and medical disease was suspected.  This study also showed a 20 cm cyst  possibly in the left adnexa.  The patient was treated and discharged.   She returned on January 24, 2004.  Her amylase was mildly elevated at 156,  lipase was normal at 24.  Her creatinine now  was 2 and alkaline phosphatase  was 123.  She had an upper abdominal ultrasound which revealed evidence of  prior cholecystectomy.  Her bile duct only measured 3.2 mm.  Her right  kidney measured 12.3 cm in length, and left kidney 13 cm in length. Renal  cortex appeared to be normal, and there was no hydronephrosis.  This study  was felt to be normal.  The patient returned to the ER on December 29, 2003.  Again, she had multiple studies.  Her amylase was mildly elevated at 146.  Her comprehensive chemistry panel was normal except creatinine was 2.4,  alkaline phosphatase was 122, and albumin was 2.8.  Her lipase was 27.  T4  was 9.9 which was within normal limits.  TSH was 19.532.  Urinalysis  revealed positive ketones, trace protein and moderate amount of leukocytes.  Urine culture revealed low colony count with multiple species, felt to be a  contaminant.   Finally, the patient was again sent home.  The patient was seen by Dr.  Orson Ape yesterday and finally hospitalized.  She was noted to have  hemoglobin of 7.4 with hematocrit of 22.6.  Her creatinine was 2.4.  Albumin  was 7, sodium was 131, BUN was 10, creatinine 2.2, and alkaline phosphatase  was  121.  Albumin was 2.7.   She has received two units of packed red blood cells and feels better.  She  is getting sips of clear liquids that she is tolerating.   Brendaly does not take any over-the-counter or herbal medicines.  As noted  above, she is on Synthroid, but unable to keep it down as was the case with  Phenergan or Prevacid.   PAST MEDICAL HISTORY:  Grave's disease was diagnosed in April of 2003.  She  was given radioiodine and apparently has not been on replacement therapy  until recently.  She had laparoscopic cholecystectomy nine or ten years ago.  Last year, she had a total abdominal hysterectomy for menorrhagia, uterine  fibroids by Dr. Isaiah Blakes.  She recalls at that time her hemoglobin was low,  but she did have to be transfused.   ALLERGIES:  None known.   ALLERGIES:  CODEINE.   FAMILY HISTORY:  Mother has some form of arthritis and one brother age 47  has been treated for hyperactive thyroid.  She has four other brothers and  two sisters in good health.   SOCIAL HISTORY:  She is divorced.  She has one daughter in her 61's.  She  works at [employer] in [place] where she has been for 11 years.  She  does not smoke cigarettes or drink alcohol.   PHYSICAL EXAMINATION:  GENERAL:  A pleasant, well-developed, well-nourished  [place] American female who is in no acute distress.  VITAL SIGNS:  She weighs 144.1 pounds.  She is 5 feet, 1 inch tall.  Pulse  68 per minute.  Regular.  Blood pressure 101/62, respirations 20, and  temperature 98.  HEENT:  Conjunctivae is pale.  Sclerae is nonicteric.  Oropharyngeal mucosa  is normal.  No tympanic membranes or lymphadenopathy.  CARDIAC:  Regular rhythm, normal S1 and S2.  No murmur or gallop noted.  LUNGS:  Clear to auscultation.  ABDOMEN:  Symmetrical.  Bowel sounds are normal.  Palpation reveals soft abdomen, mid epigastric tenderness.  No organomegaly or masses.  RECTAL:  Examination is deferred.  The staff did one  stool guaiac which was  negative.  EXTREMITIES:  She does not have clubbing,  peripheral edema or skin rash.   LABORATORY DATA:  On admission, WBC 12.6, H&H 7.4 and 22.6, MCV 83.9.  Platelet count 667,000.  Her H&H post transfusion is 9.4 and 28.4.  Her MET-  7, sodium 131, potassium 3.5, chloride 99, CO2 is 26, glucose 127, BUN 10,  creatinine 2.2, bilirubin 0.7.  AP 121, AST 14, ALT 10.  Total protein 7.3  with albumin of 2.7. Calcium is 8.6.  Her amylase and lipase are as above,  and thyroid studies are as above.  Urinalysis from yesterday revealed trace  ketones, 30 mg per dl of protein, 3-6 wbc's, and 0-2 rbc's.   ASSESSMENT:  Evania is a 45 year old African American female who presents  with over a month history of nausea, vomiting, sporadic or mild upper  abdominal pain, profound anemia with normal MCV, elevated serum creatinine  as well as elevated TSH level.  She is status post thyroid ablation with  radioiodine over two years ago and unable to tolerated Synthroid, but I feel  this is due to her symptoms and not due to medication.  She also has  elevated creatinine, and this may all be prerenal since her creatinine was 1  five weeks ago.  However, her kidneys were noted  to be swollen on CT.  She  may, therefore, have an underlying renal disease.  She has profound anemia,  but there is no evidence of bleeding.  Anemia profile is pending.  She may  have a multifactorial anemia.  I am not convinced that her illness can be  explained on the basis of peptic ulcer disease, but I agree upper GI tract  needs to be evaluated given her nausea and vomiting.  Given she has had one  autoimmune process, need to make sure she does not have Addison's disease.  Similarly, need to mention intracranial process in differential diagnosis of  unexplained nausea and vomiting.   RECOMMENDATIONS:  1.  MET-7 will be repeated in the a.m. along with sed rate, fasting cortisol      level.  We will go  ahead and give her Synthroid intravenously while she      is unable to tolerate the p.o. medicine.  We will start with 50      micrograms IV daily.  2.  Diagnostic esophagogastroduodenoscopy in a.m.  3.  Further recommendations will depend on findings at EGD.  4.  I agree in treating her with IV PPI empirically.   I would like to thank Dr. Orson Ape for the opportunity to participate in the  care of this nice lady.     [name]   NR/MEDQ  D:  02/01/2004  T:  02/01/2004  Job:  [id number]

## 2010-09-11 NOTE — Consult Note (Signed)
NAMEHARMONIEE, [name] [name]               ACCOUNT NO.:  [id number]   MEDICAL RECORD NO.:  [id number]          PATIENT TYPE:  INP   LOCATION:  A339                          FACILITY:  [facility]   PHYSICIAN:  Gaston Islam. Neijstrom, MD  DATE OF BIRTH:  08-19-1965   DATE OF CONSULTATION:  02/04/2004  DATE OF DISCHARGE:                                   CONSULTATION   DIAGNOSES:  1.  Anemia, hypoproliferative type secondary to an inflammatory condition      which is unclear at this time.  2.  Renal insufficiency with a creatinine of 2.5 to 2.7.  3.  Thrombocytosis with a platelet count of 535,000, probably reactive.  4.  History of tubal ligation years ago.  5.  History of one pregnancy, labor, and delivery.  6.  History of hysterectomy for benign disease with removal of her uterus      only in the last several years.   HISTORY:  This is a very pleasant, 45 year old, African-American young lady  who started feeling weak and tired about four weeks ago.  Two weeks ago, she  started having nausea and vomiting which became protracted, and she actually  went to [facility] Emergency Room about four weeks ago and then [facility] Emergency Room x 3, and then [facility] Emergency Room last  week. Then she was admitted here the other day after being seen once again  with the above-mentioned symptoms and signs.  At the time that she was  admitted here, her hemoglobin was very low at less than 8 g.  She was given  2 units of packed cells which brought hemoglobin up to 9.7.  At the time of  her admission, white count was 12,600, but it has come down to 10,800.  Differential showed a predominance of neutrophils but otherwise an  unremarkable differential.  Her platelet count was 667,000 on admission and  has come down to 535,000 by February 04, 2004.   Her other laboratory data showed that she had a sed rate which was  remarkably high at 125.  Reticulocyte count was very low at 1.2% with an  absolute reticulocyte count of 32,000 indicative of hyperproliferation of  her marrow.  Her BUN on admission was 2.5 to 2.7, and it has remained  elevated at 2.4 as of today.  Her BUN has been normal to low.  Her calcium  has been normal.  Her albumin was 2.7 on admission.  Total protein was 7.3.  Bilirubin was normal.  SGOT was 14.  SGPT was 10 on admission.  Her amylase  was normal on admission as was lipase.  Her hemoglobin A1C was 5.7 which is  well within normal range.  Her C3 and C4 levels were both normal.  Her  cortisol level was 19.9 which is normal.  HCG was less than 2.  Ferritin was  455.  Serum folic acid and 123456 levels were both normal, and her TIBC was  223, percent saturation 8%.  Her TSH was still high on admission at 31.8,  and she states that was elevated  when she was seen at [facility] a month ago  and told to be very, very high.   Her hepatitis B and C antibodies were negative.  HIV test is negative, and  her urinalysis has shown a urine total protein of 275 mg for 24 hours which  is elevated.   Her vital signs since being here have shown that she has had a temperature  as high as 99.9 on 9 October, and then it rose this morning to 100.7 degrees  actually at 3 p.m. this afternoon.   She has actually had a number of radiology procedures over the last several  weeks.  Indeed, she had CT abdomen and pelvis on 21 September which did show  fairly enlarged kidneys.  At least they were certainly at the upper limits  of normal for a woman who is approximately 5 feet 5 inches tall or less.   Ultrasound also showed that her kidneys seemed to be at least at the upper  limits of normal as far as size is concerned, and they looked a little  thickened realistically as far as size was concerned.  She had CT of the  head which was negative, and she had a two-view abdominal film which was  unremarkable as well.   SOCIAL HISTORY:  She has been married, but she is now divorced.  She  has one  daughter who is 47 and n good health to the best of her knowledge.   She is working at a [employer], [employer] here in [place].  She has worked there  for a number of years.   She does not drink to excess.  She does not use drugs.  She has had one  boyfriend for several months now, and she is sexually active but only with  him.   REVIEW OF SYSTEMS:  She had not lost weight until she got sick a month ago.  Since then, she has lost about 17 pounds.  Again, no night sweats, fevers,  chills.  She is not aware of any lumps or bumps anywhere.  She has no breast  masses that she is aware of.  She has not had blood in her stool or blood in  her urine.  She has lost her appetite.  She does not desire food, etc.  She  has not had joint symptoms.  She has not had a blood clot. She does not have  trouble swallowing her food.  She has not had hair loss.   PHYSICAL EXAMINATION:  GENERAL:  She is a pleasant lady in no acute  distress.  VITAL SIGNS:  This morning when I saw her, temperature 98.7 degrees, but  again it was 100.7 degrees this afternoon.  Pulse 80 and regular,  respirations 16 to 18 and unlabored, blood pressure 120/70.  LYMPH NODES:  Negative throughout.  BREASTS:  Negative bilaterally for masses.  LUNGS:  Clear.  HEART:  Regular rhythm and rate without murmur, rub, or gallop.  HEENT:  She had an unremarkable throat.  Teeth were in fair repair.  Pupils  equally round and reactive to light.  She was alert and oriented.  ABDOMEN:  Soft and nontender without hepatosplenomegaly or masses.  EXTREMITIES:  She had no peripheral edema.  Pulses were 1+ and symmetrical.  SKIN:  Unremarkable.  She had no rash.   IMPRESSION:  This lady has a very high sed rate in the setting of anemia and  mildly elevated platelet count and mildly elevated white count  which I  suspect are reactive.  I suspect her hemoglobin is low because of hypoproliferation of her marrow with suppression due to the  inflammatory  disease that she probably has.   I have reviewed her x-rays today with Dr. Remer Macho in the radiology  department.  She does have probably generous-size kidneys for a person her  size.  Therefore, I think whatever is going on with her kidneys, whether it  is a vasculitis or lupus, is causing this anemia, etc.  She has had a  negative ANA and a negative rheumatoid factor, but again a very high  sedimentation rate of 125.   I discussed her case with Dr. Lowanda Foster, and she, I suspect, will need  steroids at some point, but it would be nice to see what her diagnosis is  and if it can be established with a kidney biopsy.   I believe, however, the basis of her disease is on some type of inflammatory  disorder which is yet to be identified but hopefully will be in the near  future.      ESN/MEDQ  D:  02/04/2004  T:  02/04/2004  Job:  [id number]   cc:   Leonides Grills, M.D.  [address]  [place]  [place] [address]  Fax: [place], M.D.  [address]  [place]  [place] [address]  Fax: [phone]

## 2010-09-11 NOTE — H&P (Signed)
[name] [name], [name] [name] NO.:  [id number]   MEDICAL RECORD NO.:  [id number]          PATIENT TYPE:  INP   LOCATION:  A339                          FACILITY:  [facility]   PHYSICIAN:  Leonides Grills, M.D.    DATE OF BIRTH:   DATE OF ADMISSION:  01/31/2004  DATE OF DISCHARGE:  LH                                HISTORY & PHYSICAL   CHIEF COMPLAINT:  Vomiting for 2 weeks.   PRESENTING ILLNESS:  This is a 45 year old female who states that she has  been having protracted vomiting for 2 weeks. She has been to several ERs  including: [facility], [facility], and [place]; received IV fluids  and antiemetics, but continued vomiting.  She states that she has not had a  bowel movement for 9 days.  According to my chart she has lost 11 pounds in  the last 3 weeks. She will be admitted for IV fluids and for an evaluation.   ALLERGIES:  She is allergic to CODEINE.   MEDICATIONS:  She currently takes Synthroid 150 mcg daily.  She is status  post a cholecystectomy and tubal ligation.   REVIEW OF SYSTEMS:  She states she does have a mild cough and mild  headaches.  Denies chest pains or shortness of breath.   SOCIAL HISTORY:  She is a nonsmoker, nondrinker.  Denies substance abuse.  She works at [employer].   PHYSICAL EXAMINATION:  GENERAL:  A middle-aged female who is in no severe  distress.  VITAL SIGNS:  Temperature is 99.8, pulse of 76 and regular, blood pressure  is 120/70.  Her weight is 144 in my office.  HEENT:  TMs normal.  Pupils are equal, react to light and accommodation.  Oropharynx is benign.  NECK:  Supple, without JVD, bruit or thyromegaly.  LUNGS:  Clear in all areas.  HEART:  Regular sinus rhythm without murmur, gallop, or rub.  ABDOMEN:  Soft and nontender.  EXTREMITIES:  Without clubbing, cyanosis, or edema.  NEUROLOGIC:  Grossly intact.   ASSESSMENT:  1.  Protracted vomiting.  2.  An 11 pound weight loss in 3 weeks.  3.  No bowel  movement x9 days.  4.  Hypothyroidism.  5.  Status post cholecystectomy.  6.  Status post tubal ligation.                                                         Leonides Grills, M.D.  Electronically Signed    WMM/MEDQ  D:  01/31/2004  T:  01/31/2004  Job:  [id number]

## 2010-09-11 NOTE — Discharge Summary (Signed)
[name] [name], [name] [name]               ACCOUNT NO.:  [id number]   MEDICAL RECORD NO.:  [id number]          PATIENT TYPE:  INP   LOCATION:  A320                          FACILITY:  [facility]   PHYSICIAN:  Leonides Grills, M.D.DATE OF BIRTH:  February 24, 1966   DATE OF ADMISSION:  03/06/2004  DATE OF DISCHARGE:  11/18/2005LH                                 DISCHARGE SUMMARY   DISCHARGE DIAGNOSES:  1.  Protracted vomiting secondary to gastroparesis.  2.  Chronic renal insufficiency with a history of acute interstitial      nephritis.  3.  Anemia, persistent, with chronic disease, and renal insufficiency.  4.  Hypothyroidism.  5.  Nonspecific abnormality of immunoelectrophoresis.   HOSPITAL COURSE:  This 45 year old female was readmitted for protracted  vomiting.  Approximately one month to six weeks before, she had a similar  episode with significant weight loss, protracted vomiting, anemia, and was  found to have creatinine in the 2.5 range.  Workup at that time showed renal  insufficiency to be the cause of her illness.  She was transferred to  [facility] where she underwent kidney biopsy which was consistent with  interstitial nephritis.  She was discharged home from [facility] on steroids.  She did well for several days and then vomiting recurred.  The patient had  been vomiting now for over a week to 10 days, and has continued to have  weight loss.  She was admitted with gentle IV fluids  She was seen in  consultation by Dr. Alison [name] for nephrology as well as Dr. Bridgette [name] for GI.  The patient's initial labs showed a white count of  9700 with hemoglobin of 9.0.  At the time of discharge, her white count was  23,000.  This was felt to be caused by steroids that had been started in the  interim, but were discontinued by the time of the white count.  Her  hemoglobin was very stable at 8.8.  Her anemia profile showed iron slightly  low at 38 with 42 being the lower limits of  normal.  Iron binding capacity  was slightly low at 206 with 250 being lower limits of normal.  Saturations  were 18 with 20 being the lower limits.  B12 was normal.  Folate was normal.  Her ferritin was high at 603.  It was felt her anemia was a combination of  chronic disease with renal insufficiency and nutritional due to her  protracted vomiting.  The patient's electrolytes on admission showed a  creatinine of 2.2, potassium of 3.1, sodium 135, albumin was low at 2.9,  potassium was corrected to 4.6 at the time of discharge.  Creatinine is 1.7  at the time of discharge.  The patient did have an elevated alkaline  phosphatase of 190 and amylase of 186.  Amylase was felt to be elevated due  to renal insufficiency.  She underwent reexamination for immunologic disease  with C3 and C4 complements being normal.  Cortisols were normal.  Urinalysis  showed some protein, otherwise, basically within normal limits.  Rheumatoid  factor was negative.  ANA was negative.  The patient's immunoelectrophoresis  was not specifically abnormal with a low beta globulin, slightly high gamma  globulin, slightly high alpha 1 and 2 globulins.  Interpretation was  nonspecific pattern sometimes seen in association with chronic inflammatory  response.  Immunofixation electrophoresis was recommended to rule out  amyloidosis, myeloma or macroglobulinemia.  This test is not back at the  time of this discharge.   The patient underwent gastric emptying study which was significantly  abnormal, consistent with gastroparesis.  She was started on Reglan,  Zelnorm, and has tolerated a regular diet since without vomiting.   DISCHARGE MEDICATIONS:  She is discharged home on:  1.  Reglan 5 mg before meals and at bedtime.  2.  Zelnorm 60 mg twice a day.  3.  Synthroid 150 mg daily.  4.  Protonix 40 mg daily.  5.  Multivitamin with iron daily.   FOLLOWUP:  She will be followed with Dr. Alison [name] for possibly   Epogen injections and GI.  She will be followed at [facility] as  needed.   DISCHARGE CONDITION:  Stable.     Will   WMM/MEDQ  D:  03/12/2004  T:  03/12/2004  Job:  [id number]

## 2010-09-11 NOTE — Consult Note (Signed)
[name], [name]               ACCOUNT NO.:  [id number]   MEDICAL RECORD NO.:  [id number]          PATIENT TYPE:  INP   LOCATION:  A320                          FACILITY:  [facility]   PHYSICIAN:  Alison Murray, M.D.DATE OF BIRTH:  [birth year]   DATE OF CONSULTATION:  03/10/2004  DATE OF DISCHARGE:                                   CONSULTATION   REASON FOR CONSULTATION:  Renal insufficiency.   HISTORY OF PRESENT ILLNESS:  [name] [name] is a patient who is 45 years old,  African-American, with a past medical history of hypothyroidism, history of  nausea and vomiting, and __________  presently, who came to the emergency  room with some nausea and vomiting, unable to keep down anything.  [name] [name]  was in the hospital recently for a similar problem.  During that time, she  was found to have acute renal failure with nephromegaly, ANA negative,  complement negative, and because of that, the patient was referred to  [facility].  Kidney biopsy shows her to have interstitial nephritis,  treated with steroids, and it came [name].  Presently, as stated above, the  patient is still with some nausea and vomiting and poor appetite.  She  denies any shortness of breath, dizziness, or lightheadedness.   PAST MEDICAL HISTORY:  1.  As stated above.  2.  The patient with possible history of Graves' disease.  She is status      post radiation therapy.  3.  History of uterine bleeding secondary to fibroids.  Status post      hysterectomy.  4.  History of iron-deficiency anemia.  5.  History of hypothyroidism.  6.  History of renal insufficiency.  7.  Possible interstitial nephritis.  8.  History of recurrent nausea and vomiting of unknown etiology.   SOCIAL HISTORY:  No history of smoking.  No history of alcohol abuse.   ALLERGIES:  CODEINE.   CURRENT MEDICATIONS:  1.  IV fluids at 2525 cc per hour.  2.  Solu-Medrol 125 mg IV q.24h.  3.  Reglan 5 mg IV at h.s.  4.  Protonix 40 mg IV.  5.   Zelnorm 6 mg p.o. b.i.d.  6.  Dilaudid on a p.r.n. basis.  7.  Zofran on a p.r.n. basis.   REVIEW OF SYSTEMS:  Presently, she feels better.  She does not have any  nausea or vomiting.  Yesterday, after she received some medication, she felt  better.  No chest pain, no shortness of breath, no dizziness, no urgency or  frequency.   PHYSICAL EXAMINATION:  GENERAL:  The patient is alert, in no apparent  distress.  VITAL SIGNS:  Temperature is 97.8, blood pressure 118/65, pulse 47.  HEENT:  No conjunctival pallor, no icterus.  Oral mucosa seems to be moist.  NECK:  Supple.  No JVD.  CHEST:  Clear to auscultation.  No rales, no rhonchi.  No egophony.  HEART:  Regular rate and rhythm.  No murmur.  No S3.  ABDOMEN:  Soft, positive bowel sounds.  EXTREMITIES:  She does not have any edema.   LABORATORY  DATA:  Her white blood cell count is 8.3, hemoglobin 8.9,  hematocrit 26.8, platelets of 583.  Absolute __________  32.3.  Reticulocyte  count is 1.  Sodium 133, potassium 3.8, BUN 7, creatinine 1.9, and glucose  of 168.  Her iron studies, which were done on March 07, 2004 revealed  iron saturation of 18, ferritin of 603; prior to that, her iron saturation  was 8, and ferritin of 453.  As stated above, the patient had some work-up  during her previous admission.  Her complement was normal.  ANA was  negative.  She had nephrotic range proteinuria of 665, and urine protein  electrophoresis showed polyclonal gammopathy.  No monoclonal gammopathy.  Presently, her UA specific gravity is 1.04, pH of 5.   ASSESSMENT:  1.  Renal insufficiency.  As stated above, the patient with a history of      acute renal failure, status post kidney biopsy, consistent with acute      interstitial nephritis, treated with steroids.  Her creatinine has      improved.  Presently, her creatinine is 1.9; previously it was as high      at 2.9, and hence has decreased by 1.  Presently, she does not have any      uremic  signs or symptoms.  As stated above, ANA was negative, complement      was negative.  Rheumatoid factor was less than 20.  ANCA was negative.      Complement was normal.  She has some proteinuria.  2.  Nausea and vomiting.  Etiology unknown; probably some sort of      vasculitis.  The patient presently also on a steroid and Reglan.  Also      on medication for nausea.  She seems to be feeling better.  3.  Anemia.  This, at this moment, seems to be multifactorial.  Probably,      she might have iron-deficiency anemia.  I am not sure whether she has      some bone marrow problem, as her reticulocyte count is slightly low,      recurrent anemia; however, her platelet seems to be somewhat __________      reactive.  4.  History of hypothyroidism.  She is on Synthroid.   RECOMMENDATION:  I agree with the present management.  Will follow patient.  If her blood pressure allows [place], and if her proteinuria increases, probably  will consider starting her on ARBs.  Will follow patient.     Bely   BB/MEDQ  D:  03/10/2004  T:  03/10/2004  Job:  [id number]

## 2010-10-02 ENCOUNTER — Encounter: Payer: Self-pay | Admitting: Internal Medicine

## 2010-11-18 ENCOUNTER — Other Ambulatory Visit (HOSPITAL_COMMUNITY): Payer: Self-pay | Admitting: General Surgery

## 2010-11-18 DIAGNOSIS — E0590 Thyrotoxicosis, unspecified without thyrotoxic crisis or storm: Secondary | ICD-10-CM

## 2010-11-18 LAB — THYROID PANEL WITH TSH
Free Thyroxine Index: 3.4
T3 Uptake: 28.9
TSH: 1.76 u[IU]/mL (ref 0.41–5.90)

## 2010-11-20 ENCOUNTER — Ambulatory Visit (HOSPITAL_COMMUNITY)
Admission: RE | Admit: 2010-11-20 | Discharge: 2010-11-20 | Disposition: A | Discharge status: Home / Self Care | Payer: BC Managed Care – PPO | Source: Ambulatory Visit | Attending: General Surgery | Admitting: General Surgery

## 2010-11-20 DIAGNOSIS — E0590 Thyrotoxicosis, unspecified without thyrotoxic crisis or storm: Secondary | ICD-10-CM | POA: Insufficient documentation

## 2010-12-07 ENCOUNTER — Ambulatory Visit: Payer: BC Managed Care – PPO | Admitting: Urgent Care

## 2010-12-07 ENCOUNTER — Telehealth: Payer: Self-pay | Admitting: Urgent Care

## 2010-12-08 NOTE — Telephone Encounter (Signed)
Routed to provider

## 2010-12-18 ENCOUNTER — Telehealth: Payer: Self-pay | Admitting: Urgent Care

## 2010-12-18 ENCOUNTER — Ambulatory Visit: Payer: BC Managed Care – PPO | Admitting: Urgent Care

## 2010-12-18 NOTE — Telephone Encounter (Signed)
Noted  

## 2010-12-21 ENCOUNTER — Telehealth: Payer: Self-pay

## 2010-12-21 NOTE — Telephone Encounter (Signed)
It is okay to take 17 g of MiraLax twice daily as needed for constipation. Would offer this nice lady a followup appointment. However, no prescriptions from the office until she comes back to see [place].

## 2010-12-21 NOTE — Telephone Encounter (Signed)
Pt called- she was due to come back for ov in June 2012 per RMR recommendations. Pt has no showed for her last 2 appointments this month. She stated she wasn't really having any real problems just constipation. Pt wants to know if she can just take her miralax bid. Please advise.

## 2010-12-21 NOTE — Telephone Encounter (Signed)
Tried to call pt- number would not go through

## 2010-12-22 NOTE — Telephone Encounter (Signed)
Tried to call pt- [name] [name] on voicemail with instructions. Please schedule follow up ov.

## 2010-12-31 ENCOUNTER — Encounter: Payer: Self-pay | Admitting: Internal Medicine

## 2010-12-31 NOTE — Telephone Encounter (Signed)
appt card was mailed to pt for OV on 01/21/11 at 2pm with LSL

## 2011-01-21 ENCOUNTER — Encounter: Payer: Self-pay | Admitting: Gastroenterology

## 2011-01-21 ENCOUNTER — Ambulatory Visit (INDEPENDENT_AMBULATORY_CARE_PROVIDER_SITE_OTHER): Payer: BC Managed Care – PPO | Admitting: Gastroenterology

## 2011-01-21 VITALS — BP 135/79 | HR 79 | Temp 97.7°F | Ht 61.0 in | Wt 177.6 lb

## 2011-01-21 DIAGNOSIS — K5909 Other constipation: Secondary | ICD-10-CM

## 2011-01-21 DIAGNOSIS — K3184 Gastroparesis: Secondary | ICD-10-CM

## 2011-01-21 MED ORDER — LUBIPROSTONE 24 MCG PO CAPS: 24.0000 ug | ORAL_CAPSULE | Freq: Two times a day (BID) | ORAL | Status: AC

## 2011-01-21 NOTE — Assessment & Plan Note (Signed)
Diet for gastroparesis provided. Hopefully this can be managed with diet as side effect profile for Reglan is concerning. Domperidone may not be a good option due to cost.

## 2011-01-21 NOTE — Progress Notes (Signed)
Cc to Dr Elonda Husky

## 2011-01-21 NOTE — Progress Notes (Signed)
Primary Care Physician: No primary provider on file. Primary gynecologist: Dr. Tania Ade  Primary Gastroenterologist:  Garfield Cornea, MD   Chief Complaint  Patient presents with  . Follow-up  . Bloated    not going to the bathroom a like before    HPI: [name] [name] is a 45 y.o. female here for followup of idiopathic gastroparesis, constipation. Last seen in January of this year. Miralax once daily. Small bowel movement every day but no relief. Feels bloating. Eat bag of chips and glass of tea and then feels full the rest of the day. First eats at 10am. No heartburn. Lots of belching. No vomiting. Belching makes her feels better. Some nausea.  Zofran prn about every other day. Some intermittent LLQ pain. Worse if constipation. No brbpr or melena. 160 lb in 2009. 174 pounds in January 2011. 177 pounds today. Last lab work by Dr. Romona Curls in 10/2010. Saw him for her thyroid, she states she did not realize he is a Education officer, environmental.  Current Outpatient Prescriptions  Medication Sig Dispense Refill  . levothyroxine (SYNTHROID, LEVOTHROID) 75 MCG tablet Take 75 mcg by mouth daily.        . ondansetron (ZOFRAN) 4 MG tablet Take 4 mg by mouth every 8 (eight) hours as needed.        . polyethylene glycol (MIRALAX / GLYCOLAX) packet Take 17 g by mouth daily.          Allergies as of 01/21/2011 - Review Complete 01/21/2011  Allergen Reaction Noted  . Codeine Nausea And Vomiting     ROS:  General: Negative for anorexia, weight loss, fever, chills, fatigue, weakness. ENT: Negative for hoarseness, difficulty swallowing , nasal congestion. CV: Negative for chest pain, angina, palpitations, dyspnea on exertion, peripheral edema.  Respiratory: Negative for dyspnea at rest, dyspnea on exertion, cough, sputum, wheezing.  GI: See history of present illness. GU:  Negative for dysuria, hematuria, urinary incontinence, urinary frequency, nocturnal urination.  Endo: Negative for unusual weight  change. Progressive weight gain.   Physical Examination:   BP 135/79  Pulse 79  Temp(Src) 97.7 F (36.5 C) (Temporal)  Ht 5\' 1"  (1.549 m)  Wt 177 lb 9.6 oz (80.559 kg)  BMI 33.56 kg/m2  General: Well-nourished, well-developed in no acute distress.  Eyes: No icterus. Mouth: Oropharyngeal mucosa moist and pink , no lesions erythema or exudate. Lungs: Clear to auscultation bilaterally.  Heart: Regular rate and rhythm, no murmurs rubs or gallops.  Abdomen: Bowel sounds are normal, nontender, nondistended, no hepatosplenomegaly or masses, no abdominal bruits or hernia , no rebound or guarding.   Extremities: No lower extremity edema. No clubbing or deformities. Neuro: Alert and oriented x 4   Skin: Warm and dry, no jaundice.   Psych: Alert and cooperative, normal mood and affect.  Imaging Studies: Thyroid ultrasound on 11/20/2010. No acute findings.

## 2011-01-21 NOTE — Assessment & Plan Note (Addendum)
Chronic constipation. She tried to increase her MiraLax to twice a day but states it increased her nausea. Discussed options of Amitiza. Advised of potential for nausea at this can be prevented with taking it with a meal. Also side effect of nausea improves with time. If we can get her bowels moving more effectively her bloating and nausea may improve as well. Slowly increase dietary fiber, cautiously, as this may worsen gastroparesis  Will retrieve blood work from Dr. Hulan Amato office. Further recommendations to follow.

## 2011-01-21 NOTE — Patient Instructions (Addendum)
#1 Please start amitiza 86mcg for your constipation. Take one tablet once or twice daily with food. #2 You may stop Miralax two days after amitiza started. #3 We will review the blood work Dr. Romona Curls did in 10/2010 and make further recommendations.  #4 See separate gastroparesis diet. You have a condition where your stomach digest her food slowly. This can cause a feeling of fullness, nausea, vomiting. It is important that you consume 5-6 small meals daily. Do not overeat. It is also important that you consume only low-fat food which is easier to digest.  High-Fiber Diet A high-fiber diet changes your normal diet to include more whole grains, legumes, fruits, and vegetables. Changes in the diet involve replacing refined carbohydrates with unrefined foods. The calorie level of the diet is essentially unchanged. The Dietary Reference Intake (recommended amount) for adult males is 38 grams per day. For adult females, it is 25 grams per day. Pregnant and lactating women should consume 28 grams of fiber per day. Fiber is the intact part of a plant that is not broken down during digestion. Functional fiber is fiber that has been isolated from the plant to provide a beneficial effect in the body. PURPOSE  Increase stool bulk.   Ease and regulate bowel movements.   Lower cholesterol.  INDICATIONS THAT YOU NEED MORE FIBER  Constipation and hemorrhoids.   Uncomplicated diverticulosis (intestine condition) and irritable bowel syndrome.   Weight management.   As a protective measure against hardening of the arteries (atherosclerosis), diabetes, and cancer.  NOTE OF CAUTION If you have a digestive or bowel problem, ask your caregiver for advice before adding high-fiber foods to your diet. Some of the following medical problems are such that a high-fiber diet should not be used without consulting your caregiver. DO NOT USE WITH:  Acute diverticulitis (intestine infection).   Partial small bowel  obstructions.   Complicated diverticular disease involving bleeding, rupture (perforation), or abscess (boil, furuncle).   Presence of autonomic neuropathy (nerve damage) or gastric paresis (stomach cannot empty itself).  GUIDELINES FOR INCREASING FIBER IN THE DIET  Start adding fiber to the diet slowly. A gradual increase of about 5 more grams (2 slices of whole-wheat bread, 2 servings of most fruits or vegetables, or 1 bowl of high-fiber cereal) per day is best. Too rapid an increase in fiber may result in constipation, flatulence, and bloating.   Drink enough water and fluids to keep your urine clear or pale yellow. Water, juice, or caffeine-free drinks are recommended. Not drinking enough fluid may cause constipation.   Eat a variety of high-fiber foods rather than one type of fiber.   Try to increase your intake of fiber through using high-fiber foods rather than fiber pills or supplements that contain small amounts of fiber.   The goal is to change the types of food eaten. Do not supplement your present diet with high-fiber foods, but replace foods in your present diet.  INCLUDE A VARIETY OF FIBER SOURCES  Replace refined and processed grains with whole grains, canned fruits with fresh fruits, and incorporate other fiber sources. White rice, white breads, and most bakery goods contain little or no fiber.   Brown whole-grain rice, buckwheat oats, and many fruits and vegetables are all good sources of fiber. These include: broccoli, Brussels sprouts, cabbage, cauliflower, beets, sweet potatoes, white potatoes (skin on), carrots, tomatoes, eggplant, squash, berries, fresh fruits, and dried fruits.   Cereals appear to be the richest source of fiber. Cereal fiber is found  in whole grains and bran. Bran is the fiber-rich outer coat of cereal grain, which is largely removed in refining. In whole-grain cereals, the bran remains. In breakfast cereals, the largest amount of fiber is found in those  with "bran" in their names. The fiber content is sometimes indicated on the label.   You may need to include additional fruits and vegetables each day.   In baking, for 1 cup white flour, you may use the following substitutions:   1 cup whole-wheat flour minus 2 tablespoons.   1/2 cup white flour plus 1/2 cup whole-wheat flour.  References: Dietary Reference Intakes: Recommended Intakes for Individuals. [employer]. Institute of Medicine. Food and Nutrition Board. Document Released: 04/12/2005 Document Re-Released: 07/07/2009 [facility] Patient Information 2011 [place].

## 2011-02-02 LAB — COMPREHENSIVE METABOLIC PANEL
ALT: 12
AST: 14
Albumin: 3.1 — ABNORMAL LOW
Alkaline Phosphatase: 66
BUN: 11
CO2: 29
Calcium: 8.7
Chloride: 109
Creatinine, Ser: 1.19
GFR calc Af Amer: >60
GFR calc non Af Amer: 50 — ABNORMAL LOW
Glucose, Bld: 104 — ABNORMAL HIGH
Potassium: 4.2
Sodium: 141
Total Bilirubin: 0.5
Total Protein: 6.9

## 2011-02-02 LAB — CBC
HCT: 32.6 — ABNORMAL LOW
Hemoglobin: 10.5 — ABNORMAL LOW
MCHC: 32.3
MCV: 86.1
Platelets: 304
RBC: 3.79 — ABNORMAL LOW
RDW: 12.9
WBC: 5.6

## 2011-02-02 LAB — DIFFERENTIAL
Basophils Absolute: 0
Basophils Relative: 1
Eosinophils Absolute: 0.2
Eosinophils Relative: 4
Lymphocytes Relative: 31
Lymphs Abs: 1.7
Monocytes Absolute: 0.5
Monocytes Relative: 8
Neutro Abs: 3.2
Neutrophils Relative %: 57

## 2011-02-02 LAB — URINALYSIS, ROUTINE W REFLEX MICROSCOPIC
Bilirubin Urine: NEGATIVE
Glucose, UA: NEGATIVE
Hgb urine dipstick: NEGATIVE
Ketones, ur: NEGATIVE
Nitrite: NEGATIVE
Protein, ur: NEGATIVE
Specific Gravity, Urine: 1.02
Urobilinogen, UA: 0.2
pH: 7

## 2011-02-02 LAB — LIPASE, BLOOD: Lipase: 42

## 2011-02-15 ENCOUNTER — Other Ambulatory Visit: Payer: Self-pay

## 2011-02-15 MED ORDER — ONDANSETRON HCL 4 MG PO TABS: 4.0000 mg | ORAL_TABLET | Freq: Three times a day (TID) | ORAL | Status: DC | PRN

## 2011-03-04 NOTE — Progress Notes (Signed)
Quick Note:  Labs look good. Old labs from Dr. Romona Curls. Get PR. ______

## 2011-03-08 NOTE — Progress Notes (Signed)
Quick Note:  Pt said she was doing ok but still having a dull pain most of the time with nausea. She would rate it as a 6 on the pain scale. No vomiting, no fever, no blood in stool. ______

## 2011-03-11 NOTE — Progress Notes (Signed)
Quick Note:  Recommend continue Amitiza, gastroparesis diet.  OV to reevaluate pain. ______

## 2011-03-16 ENCOUNTER — Ambulatory Visit: Payer: BC Managed Care – PPO | Admitting: Gastroenterology

## 2011-03-16 ENCOUNTER — Telehealth: Payer: Self-pay | Admitting: Gastroenterology

## 2011-03-16 NOTE — Telephone Encounter (Signed)
Pt was a no show

## 2011-03-19 ENCOUNTER — Other Ambulatory Visit: Payer: Self-pay | Admitting: Obstetrics & Gynecology

## 2011-03-22 NOTE — Progress Notes (Signed)
REVIEWED.  

## 2011-05-10 ENCOUNTER — Other Ambulatory Visit: Payer: Self-pay

## 2011-05-10 MED ORDER — POLYETHYLENE GLYCOL 3350 17 GM/SCOOP PO POWD: 17.0000 g | Freq: Every day | ORAL | Status: AC | PRN

## 2011-05-17 ENCOUNTER — Other Ambulatory Visit: Payer: Self-pay

## 2011-05-17 MED ORDER — POLYETHYLENE GLYCOL 3350 17 GM/SCOOP PO POWD: 17.0000 g | Freq: Every day | ORAL | Status: DC

## 2011-07-28 ENCOUNTER — Other Ambulatory Visit: Payer: Self-pay | Admitting: Obstetrics & Gynecology

## 2011-09-03 ENCOUNTER — Other Ambulatory Visit: Payer: Self-pay | Admitting: Obstetrics and Gynecology

## 2011-11-05 ENCOUNTER — Other Ambulatory Visit: Payer: Self-pay | Admitting: Gastroenterology

## 2011-11-05 MED ORDER — POLYETHYLENE GLYCOL 3350 17 GM/SCOOP PO POWD: 17.0000 g | Freq: Every day | ORAL | Status: AC | PRN

## 2011-11-05 NOTE — Progress Notes (Signed)
Received request from pharmacy for miralax 527g

## 2011-11-18 ENCOUNTER — Ambulatory Visit (INDEPENDENT_AMBULATORY_CARE_PROVIDER_SITE_OTHER): Payer: BC Managed Care – PPO | Admitting: Family Medicine

## 2011-11-18 ENCOUNTER — Encounter: Payer: Self-pay | Admitting: Family Medicine

## 2011-11-18 ENCOUNTER — Other Ambulatory Visit: Payer: Self-pay | Admitting: Family Medicine

## 2011-11-18 VITALS — BP 110/72 | HR 88 | Resp 16 | Ht 62.0 in | Wt 173.0 lb

## 2011-11-18 DIAGNOSIS — E039 Hypothyroidism, unspecified: Secondary | ICD-10-CM

## 2011-11-18 DIAGNOSIS — Z13220 Encounter for screening for lipoid disorders: Secondary | ICD-10-CM

## 2011-11-18 DIAGNOSIS — K3184 Gastroparesis: Secondary | ICD-10-CM

## 2011-11-18 DIAGNOSIS — Z139 Encounter for screening, unspecified: Secondary | ICD-10-CM

## 2011-11-18 DIAGNOSIS — K5909 Other constipation: Secondary | ICD-10-CM

## 2011-11-18 DIAGNOSIS — Z23 Encounter for immunization: Secondary | ICD-10-CM

## 2011-11-18 DIAGNOSIS — E669 Obesity, unspecified: Secondary | ICD-10-CM

## 2011-11-18 NOTE — Progress Notes (Signed)
  Subjective:    Patient ID: [name] [name], female    DOB: 02-01-1966, 46 y.o.   MRN: [id number]  HPI  Pt presents to establish care, previous PCP [facility] > 5 years ago [facility]- [facility]  GI- [facility], Dr. Gala Romney Medication and History reviewed Hypothyroidism patient had history of Graves' disease per the chart she had ablation however patient does not remember this. She is now hypothyroid and is on Synthroid although she does not know the exact dose today. She's not had her thyroid checked in about one year. She was hospitalized in 2005 at [facility] for 3 months secondary to thyroid dysfunction, and GI problems, diagnosis of gastroparesis given. She is followed by GI as needed for constipation.  She was recently laid off- therefore will lose her insurance at the end of the month She is due for Mammogram and labs  She has an appt with eye doctor Recently seen by Dentist- will have cavities filled She has a spot on her nose for past 2 days, under the skin, no pain, no rash    Review of Systems   GEN- denies fatigue, fever, weight loss,weakness, recent illness HEENT- denies eye drainage, change in vision, nasal discharge, CVS- denies chest pain, palpitations RESP- denies SOB, cough, wheeze ABD- denies N/V, change in stools, abd pain GU- denies dysuria, hematuria, dribbling, incontinence MSK- denies joint pain, muscle aches, injury Neuro- denies headache, dizziness, syncope, seizure activity      Objective:   Physical Exam GEN- NAD, alert and oriented x3 HEENT- PERRL, EOMI, non injected sclera, pink conjunctiva, MMM, oropharynx clear, right nares on latearl crease- bony like lesion felt beneath skin, non tender, non fluctuant  Neck- Supple, no thyromegaly CVS- RRR, no murmur RESP-CTAB ABD-NABS,soft,NT,ND  EXT- No edema Pulses- Radial, DP- 2+        Assessment & Plan:

## 2011-11-18 NOTE — Assessment & Plan Note (Signed)
Pt is eating okay as long as she has small meals, she has a lot of liquid calories. Advised multiple small meals, cut out sweet tea

## 2011-11-18 NOTE — Assessment & Plan Note (Signed)
Reviewed last GI note,continue Miralax

## 2011-11-18 NOTE — Assessment & Plan Note (Signed)
Check thyroid levels and adjust meds as needed

## 2011-11-18 NOTE — Patient Instructions (Addendum)
Calcium ( 1200mg ) and Vit D (800IU)  Schedule a Mammogram Cut out the sweet tea 5 small meals a day  Get the labs done fasting - we will call with results and any medication changes  TDAP given today  F/U as needed, I recommend calling the Health Department

## 2011-11-18 NOTE — Assessment & Plan Note (Signed)
Encouraged exercise 5 days aweelk ,cut back on sweet tea and other sugary snack foods  Note pt is loosing insurance, she may need to f/u at [facility] for further treatments

## 2011-11-19 LAB — TSH: TSH: 2.344 u[IU]/mL (ref 0.350–4.500)

## 2011-11-19 LAB — COMPREHENSIVE METABOLIC PANEL
Albumin: 4.3 g/dL (ref 3.5–5.2)
CO2: 29 mEq/L (ref 19–32)
Glucose, Bld: 93 mg/dL (ref 70–99)
Potassium: 4.7 mEq/L (ref 3.5–5.3)
Sodium: 138 mEq/L (ref 135–145)
Total Protein: 7.9 g/dL (ref 6.0–8.3)

## 2011-11-19 LAB — T3, FREE: T3, Free: 2.8 pg/mL (ref 2.3–4.2)

## 2011-11-19 LAB — CBC
Hemoglobin: 12.3 g/dL (ref 12.0–15.0)
MCH: 27 pg (ref 26.0–34.0)
MCHC: 32 g/dL (ref 30.0–36.0)
Platelets: 352 10*3/uL (ref 150–400)

## 2011-11-22 ENCOUNTER — Ambulatory Visit (HOSPITAL_COMMUNITY)
Admission: RE | Admit: 2011-11-22 | Discharge: 2011-11-22 | Disposition: A | Discharge status: Home / Self Care | Payer: BC Managed Care – PPO | Source: Ambulatory Visit | Attending: Family Medicine | Admitting: Family Medicine

## 2011-11-22 DIAGNOSIS — Z139 Encounter for screening, unspecified: Secondary | ICD-10-CM

## 2011-11-22 DIAGNOSIS — Z1231 Encounter for screening mammogram for malignant neoplasm of breast: Secondary | ICD-10-CM | POA: Insufficient documentation

## 2011-11-24 ENCOUNTER — Encounter (HOSPITAL_COMMUNITY): Payer: Self-pay | Admitting: Emergency Medicine

## 2011-11-24 ENCOUNTER — Emergency Department (HOSPITAL_COMMUNITY)
Admission: EM | Admit: 2011-11-24 | Discharge: 2011-11-24 | Disposition: A | Discharge status: Home / Self Care | Payer: BC Managed Care – PPO | Attending: Emergency Medicine | Admitting: Emergency Medicine

## 2011-11-24 DIAGNOSIS — L02419 Cutaneous abscess of limb, unspecified: Secondary | ICD-10-CM | POA: Insufficient documentation

## 2011-11-24 DIAGNOSIS — E039 Hypothyroidism, unspecified: Secondary | ICD-10-CM | POA: Insufficient documentation

## 2011-11-24 DIAGNOSIS — L02416 Cutaneous abscess of left lower limb: Secondary | ICD-10-CM

## 2011-11-24 MED ORDER — HYDROCODONE-ACETAMINOPHEN 7.5-325 MG PO TABS: 1.0000 | ORAL_TABLET | ORAL | Status: AC | PRN

## 2011-11-24 MED ORDER — SULFAMETHOXAZOLE-TRIMETHOPRIM 800-160 MG PO TABS: 1.0000 | ORAL_TABLET | Freq: Two times a day (BID) | ORAL | Status: AC

## 2011-11-24 MED ORDER — CEPHALEXIN 500 MG PO CAPS: 500.0000 mg | ORAL_CAPSULE | Freq: Four times a day (QID) | ORAL | Status: AC

## 2011-11-24 NOTE — ED Notes (Signed)
Pt has abscess to the left inner thigh x 2 days.

## 2011-11-24 NOTE — ED Notes (Signed)
Abscess to inner L thigh. States thinks something bit her. Nad. Hard to touch

## 2011-11-24 NOTE — ED Provider Notes (Signed)
History     CSN: [id number]  Arrival date & time 11/24/11  56   First MD Initiated Contact with Patient 11/24/11 1103      Chief Complaint  Patient presents with  . Abscess    (Consider location/radiation/quality/duration/timing/severity/associated sxs/prior treatment) Patient is a 46 y.o. female presenting with abscess. The history is provided by the patient.  Abscess  This is a new problem. The current episode started less than one week ago. The onset was gradual. The problem occurs frequently. The problem has been gradually worsening. The abscess is present on the left upper leg. The problem is moderate. The abscess is characterized by painfulness. Incident location: unknown. Pertinent negatives include no anorexia, no decrease in physical activity and no cough. There were no sick contacts. She has received no recent medical care.    Past Medical History  Diagnosis Date  . Constipation   . Nondiabetic gastroparesis   . Grave's disease     diagnosed years ago/ now hypothyroidism    Past Surgical History  Procedure Date  . Cholecystectomy   . Tonsillectomy   . Tubal ligation   . S/p hysterectomy    . Esophagogastroduodenoscopy 01/2004    Dr. Almyra Free small polyps in postbulbar area ablated  . Attempted colonoscopy 03/2004    Dr. Rourk--> prep was poor. Exam to 40 cm. No gross abnormalities. Internal hemorrhoids. Air contrast barium enema was normal.    Family History  Problem Relation Age of Onset  . Colon cancer Neg Hx   . Other Father 69    hit by train  . Heart disease Mother   . Kidney disease Mother     History  Substance Use Topics  . Smoking status: Never Smoker   . Smokeless tobacco: Not on file  . Alcohol Use: No    OB History    Grav Para Term Preterm Abortions TAB SAB Ect Mult Living                  Review of Systems  Constitutional: Negative for activity change.       All ROS Neg except as noted in HPI  HENT: Negative for nosebleeds  and neck pain.   Eyes: Negative for photophobia and discharge.  Respiratory: Negative for cough, shortness of breath and wheezing.   Cardiovascular: Negative for chest pain and palpitations.  Gastrointestinal: Positive for abdominal pain and constipation. Negative for blood in stool and anorexia.  Genitourinary: Negative for dysuria, frequency and hematuria.  Musculoskeletal: Negative for back pain and arthralgias.  Skin: Negative.   Neurological: Negative for dizziness, seizures and speech difficulty.  Psychiatric/Behavioral: Negative for hallucinations and confusion.    Allergies  Codeine  Home Medications   Current Outpatient Rx  Name Route Sig Dispense Refill  . LEVOTHYROXINE SODIUM 75 MCG PO TABS Oral Take 75 mcg by mouth daily.      Marland Kitchen ONDANSETRON HCL 4 MG PO TABS Oral Take 1 tablet (4 mg total) by mouth every 8 (eight) hours as needed. 20 tablet 0  . POLYETHYLENE GLYCOL 3350 PO POWD Oral Take 17 g by mouth daily as needed. 527 g 3    BP 155/98  Pulse 90  Temp 98.5 F (36.9 C)  Resp 15  SpO2 100%  Physical Exam  Nursing note and vitals reviewed. Constitutional: She is oriented to person, place, and time. She appears well-developed and well-nourished.  Non-toxic appearance.  HENT:  Head: Normocephalic.  Right Ear: Tympanic membrane and external ear normal.  Left Ear: Tympanic membrane and external ear normal.  Eyes: EOM and lids are normal. Pupils are equal, round, and reactive to light.  Neck: Normal range of motion. Neck supple. Carotid bruit is not present.  Cardiovascular: Normal rate, regular rhythm, normal heart sounds, intact distal pulses and normal pulses.   Pulmonary/Chest: Breath sounds normal. No respiratory distress.  Abdominal: Soft. Bowel sounds are normal. There is no tenderness. There is no guarding.  Musculoskeletal: Normal range of motion.       There is a red raised area with a pimple in the Center at the inner aspect of the left thigh. There is no  red streaking present. There is no inguinal lymph node palpable. The distal pulses are symmetrical.  Lymphadenopathy:       Head (right side): No submandibular adenopathy present.       Head (left side): No submandibular adenopathy present.    She has no cervical adenopathy.  Neurological: She is alert and oriented to person, place, and time. She has normal strength. No cranial nerve deficit or sensory deficit.  Skin: Skin is warm and dry.  Psychiatric: She has a normal mood and affect. Her speech is normal.    ED Course  Procedures (including critical care time)  Labs Reviewed - No data to display No results found.   No diagnosis found.    MDM  I have reviewed nursing notes, vital signs, and all appropriate lab and imaging results for this patient. The abscess of the left inner thigh is not a candidate for incision and drainage at this time. The plan at this time is for the patient to soak in warm Epsom salt water. Patient is to use Bactrim DS and Keflex as well as Norco for pain. Patient is to return if any changes or problems in involving the abscess area of the left upper leg.       Lenox Ahr, [place] 11/24/11 [place], [place] 11/24/11 1655

## 2011-11-26 NOTE — ED Provider Notes (Signed)
Medical screening examination/treatment/procedure(s) were performed by non-physician practitioner and as supervising physician I was immediately available for consultation/collaboration.   Sharyon Cable, MD 11/26/11 [phone]

## 2011-12-15 ENCOUNTER — Telehealth: Payer: Self-pay | Admitting: *Deleted

## 2011-12-15 NOTE — Telephone Encounter (Signed)
Ms Braulio Conte called today. She has been using Miralax and it isnt helping her as much as she would like. She would like a call back to discuss other options.

## 2011-12-15 NOTE — Telephone Encounter (Signed)
Pt is going to try for cone assistance and then call me back for an appointment

## 2011-12-15 NOTE — Telephone Encounter (Signed)
LMOM to call back

## 2011-12-22 ENCOUNTER — Ambulatory Visit: Payer: BC Managed Care – PPO | Admitting: Gastroenterology

## 2011-12-22 ENCOUNTER — Encounter: Payer: Self-pay | Admitting: Urgent Care

## 2011-12-22 ENCOUNTER — Ambulatory Visit (INDEPENDENT_AMBULATORY_CARE_PROVIDER_SITE_OTHER): Payer: Self-pay | Admitting: Urgent Care

## 2011-12-22 VITALS — BP 139/75 | HR 72 | Temp 98.4°F | Ht 62.0 in | Wt 172.4 lb

## 2011-12-22 DIAGNOSIS — K5909 Other constipation: Secondary | ICD-10-CM

## 2011-12-22 DIAGNOSIS — K219 Gastro-esophageal reflux disease without esophagitis: Secondary | ICD-10-CM

## 2011-12-22 DIAGNOSIS — K3184 Gastroparesis: Secondary | ICD-10-CM

## 2011-12-22 MED ORDER — LINACLOTIDE 290 MCG PO CAPS: 290.0000 ug | ORAL_CAPSULE | Freq: Every day | ORAL | Status: DC

## 2011-12-22 MED ORDER — DEXLANSOPRAZOLE 60 MG PO CPDR: 60.0000 mg | DELAYED_RELEASE_CAPSULE | Freq: Every day | ORAL | Status: DC

## 2011-12-22 NOTE — Progress Notes (Signed)
Primary Care Physician:  Vic Blackbird, MD Primary Gastroenterologist:  Dr. Gala Romney  Chief Complaint  Patient presents with  . Follow-up    HPI:  [name] [name] is a 46 y.o. female here for constipation.  She has history of idiopathic gastroparesis and chronic constipation.  She feels like Miralax stopped working last week.  She was having 1-2 BMs daily, now 1 daily.  Denies rectal bleeding bleeding or melena.  C/o intermittent mid-abdominal pain, lasts seconds, better w/ defecation.  Nausea after eating.  Vomited once last week.  Some heartburn or indigestion despite taking prilosec 20mg  daily.  Feels like it may not work as well as it should.  Feels like "something in her throat" on swallowing saliva.  C/o globus.    Denies dysphagia or odynophagia.  Had a TSH July 31 normal.   Past Medical History  Diagnosis Date  . Constipation   . Nondiabetic gastroparesis   . Grave's disease     diagnosed years ago/ now hypothyroidism    Past Surgical History  Procedure Date  . Cholecystectomy   . Tonsillectomy   . Tubal ligation   . S/p hysterectomy    . Esophagogastroduodenoscopy 01/2004    Dr. Almyra Free small polyps in postbulbar area ablated  . Attempted colonoscopy 03/2004    Dr. Rourk--> prep was poor. Exam to 40 cm. No gross abnormalities. Internal hemorrhoids. Air contrast barium enema was normal.    Current Outpatient Prescriptions  Medication Sig Dispense Refill  . levothyroxine (SYNTHROID, LEVOTHROID) 75 MCG tablet Take 75 mcg by mouth daily.        . polyethylene glycol powder (GLYCOLAX) powder Take 17 g by mouth daily as needed.  527 g  3    Allergies as of 12/22/2011 - Review Complete 12/22/2011  Allergen Reaction Noted  . Codeine Nausea And Vomiting     Family History  Problem Relation Age of Onset  . Colon cancer Neg Hx   . Other Father 33    hit by train  . Heart disease Mother   . Kidney disease Mother     History   Social History  . Marital Status:  Single    Spouse Name: N/A    Number of Children: 1  . Years of Education: N/A   Occupational History  . unemployed since July 3    Social History Main Topics  . Smoking status: Never Smoker   . Smokeless tobacco: Not on file  . Alcohol Use: No  . Drug Use: No  . Sexually Active: Not on file   Other Topics Concern  . Not on file   Social History Narrative   1 grown healthy daughter   Review of Systems: Gen: Denies any fever, chills, sweats, anorexia, fatigue, weakness, malaise, weight loss, and sleep disorder CV: Denies chest pain, angina, palpitations, syncope, orthopnea, PND, peripheral edema, and claudication. Resp: Denies dyspnea at rest, dyspnea with exercise, cough, sputum, wheezing, coughing up blood, and pleurisy. GI: Denies vomiting blood, jaundice, and fecal incontinence.    GU : Denies urinary burning, blood in urine, urinary frequency, urinary hesitancy, nocturnal urination, and urinary incontinence. MS: Denies joint pain, limitation of movement, and swelling, stiffness, low back pain, extremity pain. Denies muscle weakness, cramps, atrophy.  Derm: Denies rash, itching, dry skin, hives, moles, warts, or unhealing ulcers.  Psych: Denies depression, anxiety, memory loss, suicidal ideation, hallucinations, paranoia, and confusion. Heme: Denies bruising, bleeding, and enlarged lymph nodes. Neuro:  Denies any headaches, dizziness, paresthesias. Endo:  Denies  any problems with DM, thyroid, adrenal function.  Physical Exam: BP 139/75  Pulse 72  Temp 98.4 F (36.9 C) (Temporal)  Ht 5\' 2"  (1.575 m)  Wt 172 lb 6.4 oz (78.2 kg)  BMI 31.53 kg/m2 No LMP recorded. Patient has had a hysterectomy. General:   Alert,  Well-developed, well-nourished, pleasant and cooperative in NAD Head:  Normocephalic and atraumatic. Eyes:  Sclera clear, no icterus.   Conjunctiva pink. Ears:  Normal auditory acuity. Nose:  No deformity, discharge, or lesions. Mouth:  No deformity or  lesions,oropharynx pink & moist. Neck:  Supple; no masses or thyromegaly. Lungs:  Clear throughout to auscultation.   No wheezes, crackles, or rhonchi. No acute distress. Heart:  Regular rate and rhythm; no murmurs, clicks, rubs,  or gallops. Abdomen:  Normal bowel sounds.  No bruits.  Soft, non-tender and non-distended without masses, hepatosplenomegaly or hernias noted.  No guarding or rebound tenderness.   Rectal:  Deferred.  Msk:  Symmetrical without gross deformities. Normal posture. Pulses:  Normal pulses noted. Extremities:  No clubbing or edema. Neurologic:  Alert and  oriented x4;  grossly normal neurologically. Skin:  Intact without significant lesions or rashes. Lymph Nodes:  No significant cervical adenopathy. Psych:  Alert and cooperative. Normal mood and affect.

## 2011-12-22 NOTE — Assessment & Plan Note (Signed)
At baseline.  Not on mediations at this time.

## 2011-12-22 NOTE — Assessment & Plan Note (Addendum)
[name] [name] is a pleasant 46 y.o. female with idiopathic gastroparesis & GERD. She should restart daily PPI.    Dexilant 60mg  daily for acid reflux  Followup with Vic Blackbird, MD or a dermatologist regarding rash Office visit in 6 weeks for follow-up

## 2011-12-22 NOTE — Patient Instructions (Addendum)
Dexilant 60mg  daily for acid reflux linzess 23mcg daily for constipation You may use miralax 17 grams daily if needed Followup with Vic Blackbird, MD or a dermatologist regarding rash Office visit in 6 weeks for follow-up

## 2011-12-22 NOTE — Assessment & Plan Note (Signed)
Suspect IBS-C. Failed MiraLax and Amitiza. Trial of Linzess 249mcg daily for constipation-samples given You may use miralax 17 grams daily if needed in addition to [place]

## 2011-12-28 NOTE — Progress Notes (Signed)
Faxed to PCP

## 2012-01-28 ENCOUNTER — Ambulatory Visit (INDEPENDENT_AMBULATORY_CARE_PROVIDER_SITE_OTHER): Payer: Self-pay | Admitting: Urgent Care

## 2012-01-28 ENCOUNTER — Encounter: Payer: Self-pay | Admitting: Urgent Care

## 2012-01-28 VITALS — BP 139/83 | HR 76 | Temp 97.2°F | Ht 62.0 in | Wt 174.2 lb

## 2012-01-28 DIAGNOSIS — K3184 Gastroparesis: Secondary | ICD-10-CM

## 2012-01-28 DIAGNOSIS — K5909 Other constipation: Secondary | ICD-10-CM

## 2012-01-28 DIAGNOSIS — K219 Gastro-esophageal reflux disease without esophagitis: Secondary | ICD-10-CM

## 2012-01-28 NOTE — Assessment & Plan Note (Addendum)
Improved with linzess and MiraLax.  Daily BMs now, however, she does not feel like she has complete evacuation. Will use MiraLax bowel prep, and then resume Linzess with when necessary MiraLax.   Mix 5 teaspoons of Miralax in any 4-6 ounces of CLEAR LIQUIDS (Gatorade) every hour for 5 hours until passing clear, watery stools  If stools are not clear & watery by 6:00 PM, take 5 teaspoons of Miralax every 30 minutes until stools are clear (no color) Resume regular diet & medications day after prep including Linzess 237mcg daily & then daily miralax 17 grams daily if needed Office visit in 6 mo Call sooner if needed

## 2012-01-28 NOTE — Patient Instructions (Addendum)
Complete miralax prep over weekend Purchase:  MIRALAX 238 gram bottle START CLEAR LIQUID DIET -NO SOLID FOODS!  Mix 5 teaspoons of Miralax in any 4-6 ounces of CLEAR LIQUIDS (Gatorade) every hour for 5 hours until passing clear, watery stools  If stools are not clear & watery by 6:00 PM, take 5 teaspoons of Miralax every 30 minutes until stools are clear (no color)  Resume regular diet & medications day after prep including Linzess daily & then daily miralax if needed Office visit in 6 mo Call sooner if needed

## 2012-01-28 NOTE — Progress Notes (Signed)
Primary Care Physician:  Vic Blackbird, MD Primary Gastroenterologist:  Dr. Gala Romney  Chief Complaint  Patient presents with  . Follow-up    gastroparesis  . Constipation    HPI:  [name] [name] is a 46 y.o. female here for follow-up for constipation & idiopathic gastroparesis.  She was started on Linzess & felt like it was working well except she does not feel like she has complete evacuation.  Previously had felt like Miralax stopped working.  She is taking both Linzess & a dose of miralax 1st thing in AM.  She was having a daily BM now.  Denies rectal bleeding bleeding or melena.  Denies abdominal pain.  Occasional nausea after eating.  Past Medical History  Diagnosis Date  . Constipation   . Nondiabetic gastroparesis   . Grave's disease     diagnosed years ago/ now hypothyroidism  . IBS (irritable colon syndrome)     constipation predominant    Past Surgical History  Procedure Date  . Cholecystectomy   . Tonsillectomy   . Tubal ligation   . S/p hysterectomy    . Esophagogastroduodenoscopy 01/2004    Dr. Almyra Free small polyps in postbulbar area ablated  . Attempted colonoscopy 03/2004    Dr. Rourk--> prep was poor. Exam to 40 cm. No gross abnormalities. Internal hemorrhoids. Air contrast barium enema was normal.    Current Outpatient Prescriptions  Medication Sig Dispense Refill  . dexlansoprazole (DEXILANT) 60 MG capsule Take 60 mg by mouth daily.      Marland Kitchen levothyroxine (SYNTHROID, LEVOTHROID) 75 MCG tablet Take 75 mcg by mouth daily.        . Linaclotide (LINZESS) 290 MCG CAPS Take 290 mcg by mouth daily.  30 capsule  0  . polyethylene glycol powder (GLYCOLAX) powder Take 17 g by mouth daily as needed.  527 g  3  . DISCONTD: dexlansoprazole (DEXILANT) 60 MG capsule Take 1 capsule (60 mg total) by mouth daily.  30 capsule  0    Allergies as of 01/28/2012 - Review Complete 01/28/2012  Allergen Reaction Noted  . Codeine Nausea And Vomiting     Family History    Problem Relation Age of Onset  . Colon cancer Neg Hx   . Other Father 42    hit by train  . Heart disease Mother   . Kidney disease Mother     History   Social History  . Marital Status: Single    Spouse Name: N/A    Number of Children: 1  . Years of Education: N/A   Occupational History  . unemployed since July 3    Social History Main Topics  . Smoking status: Never Smoker   . Smokeless tobacco: Not on file  . Alcohol Use: No  . Drug Use: No  . Sexually Active: Not on file   Other Topics Concern  . Not on file   Social History Narrative   1 grown healthy daughter   Review of Systems: See HPI, otherwise normal  Physical Exam: BP 139/83  Pulse 76  Temp 97.2 F (36.2 C) (Temporal)  Ht 5\' 2"  (1.575 m)  Wt 174 lb 3.2 oz (79.017 kg)  BMI 31.86 kg/m2 No LMP recorded. Patient has had a hysterectomy. General:   Alert,  Well-developed, well-nourished, pleasant and cooperative in NAD Head:  Normocephalic and atraumatic. Eyes:  Sclera clear, no icterus.   Conjunctiva pink. Ears:  Normal auditory acuity. Nose:  No deformity, discharge, or lesions. Mouth:  No deformity or  lesions,oropharynx pink & moist. Neck:  Supple; no masses or thyromegaly. Lungs:  Clear throughout to auscultation.   No wheezes, crackles, or rhonchi. No acute distress. Heart:  Regular rate and rhythm; no murmurs, clicks, rubs,  or gallops. Abdomen:  Normal bowel sounds.  No bruits.  Soft, non-tender and non-distended without masses, hepatosplenomegaly or hernias noted.  No guarding or rebound tenderness.   Rectal:  Deferred.  Msk:  Symmetrical without gross deformities. Normal posture. Pulses:  Normal pulses noted. Extremities:  No clubbing or edema. Neurologic:  Alert and  oriented x4;  grossly normal neurologically. Skin:  Intact without significant lesions or rashes. Lymph Nodes:  No significant cervical adenopathy. Psych:  Alert and cooperative. Normal mood and affect.

## 2012-01-28 NOTE — Assessment & Plan Note (Signed)
At baseline. Mild symptoms She is doing well off medications. Advised to increase her daily exercise, eat small frequent meals, not eat within 4 hours of bedtime.

## 2012-01-28 NOTE — Assessment & Plan Note (Signed)
Well controlled on Dexilant 60mg daily  

## 2012-01-31 NOTE — Progress Notes (Signed)
Faxed to PCP

## 2012-03-28 ENCOUNTER — Telehealth: Payer: Self-pay

## 2012-03-28 MED ORDER — LUBIPROSTONE 8 MCG PO CAPS: 8.0000 ug | ORAL_CAPSULE | Freq: Two times a day (BID) | ORAL | Status: DC

## 2012-03-28 NOTE — Telephone Encounter (Signed)
Pt came to office- she was asking if she could take lactulose with the miralax. She got the bottle of lactulose from her fiances mother, but she hasn't taken any of it yet. Pt stated the linzess didn't work so she stopped taking it. I spoke with KJ- gave pt the option of trial of amitiza or trial of lactulose. Pt would need to stop the miralax with lactulose. Pt is requesting to try amitiza. Gave samples of amitiza 36mcg per KJ. Pt is to try for a week and then call us back and let us know how she is doing. Per KJ may have to try amitiza 100mcg if 8 mcg doesn't work. Pt agreed with plan. Stressed to pt that she has to take [place] with food.

## 2012-03-28 NOTE — Telephone Encounter (Signed)
Pt advised to take amitiza 69mcg BID.

## 2012-07-20 ENCOUNTER — Other Ambulatory Visit (HOSPITAL_COMMUNITY): Payer: Self-pay | Admitting: *Deleted

## 2012-07-20 DIAGNOSIS — E010 Iodine-deficiency related diffuse (endemic) goiter: Secondary | ICD-10-CM

## 2012-07-24 ENCOUNTER — Ambulatory Visit (HOSPITAL_COMMUNITY)
Admission: RE | Admit: 2012-07-24 | Discharge: 2012-07-24 | Disposition: A | Discharge status: Home / Self Care | Payer: Self-pay | Source: Ambulatory Visit | Attending: *Deleted | Admitting: *Deleted

## 2012-07-24 DIAGNOSIS — E0500 Thyrotoxicosis with diffuse goiter without thyrotoxic crisis or storm: Secondary | ICD-10-CM | POA: Insufficient documentation

## 2012-07-24 DIAGNOSIS — E010 Iodine-deficiency related diffuse (endemic) goiter: Secondary | ICD-10-CM

## 2012-08-01 ENCOUNTER — Encounter: Payer: Self-pay | Admitting: Internal Medicine

## 2012-09-11 ENCOUNTER — Ambulatory Visit (INDEPENDENT_AMBULATORY_CARE_PROVIDER_SITE_OTHER): Payer: Self-pay | Admitting: Gastroenterology

## 2012-09-11 ENCOUNTER — Encounter: Payer: Self-pay | Admitting: Gastroenterology

## 2012-09-11 VITALS — BP 127/82 | HR 73 | Temp 98.2°F | Ht 62.0 in | Wt 184.6 lb

## 2012-09-11 DIAGNOSIS — R071 Chest pain on breathing: Secondary | ICD-10-CM

## 2012-09-11 DIAGNOSIS — K3184 Gastroparesis: Secondary | ICD-10-CM

## 2012-09-11 DIAGNOSIS — R0789 Other chest pain: Secondary | ICD-10-CM | POA: Insufficient documentation

## 2012-09-11 DIAGNOSIS — K5909 Other constipation: Secondary | ICD-10-CM

## 2012-09-11 NOTE — Patient Instructions (Addendum)
1. For chest wall pain. Try ibuprofen 600 mg 3 times a day for 2 weeks only. If he continued to have pain, please notify your PCP. 2. Continue MiraLax for constipation. 3. Office visit in 6 months or sooner if needed. 4. You will be due for another colonoscopy next year.

## 2012-09-11 NOTE — Progress Notes (Signed)
Primary Care Physician: [place] [facility]  Primary Gastroenterologist:  Garfield Cornea, MD   Chief Complaint  Patient presents with  . Follow-up    HPI: [name] [name] is a 47 y.o. female for six-month followup. She has a history of constipation and idiopathic gastroparesis. Linzess did not work. Started on amitiza 7mcg BID after her last office visit. She states it really didn't help but she did not call us back to let us know this. Takes Miralax 17g each morning. BM soft twice per day. Some abdominal cramps. 15 minutes after eating something sweet, gets nauseated. Happening awhile. Denies melena, rectal bleeding, heartburn, vomiting. She has gained 10 pounds since her last office visit in October. She rarely exercises. She is not working. Her [facility] PCP is [facility], Rosalee Kaufman.  Current Outpatient Prescriptions  Medication Sig Dispense Refill  . levothyroxine (SYNTHROID, LEVOTHROID) 75 MCG tablet Take 75 mcg by mouth daily.        . polyethylene glycol powder (GLYCOLAX) powder Take 17 g by mouth daily as needed.  527 g  3   No current facility-administered medications for this visit.    Allergies as of 09/11/2012 - Review Complete 09/11/2012  Allergen Reaction Noted  . Codeine Nausea And Vomiting     ROS:  General: Negative for anorexia, weight loss, fever, chills, fatigue, weakness. ENT: Negative for hoarseness, difficulty swallowing , nasal congestion. CV: Negative for chest pain, angina, palpitations, dyspnea on exertion, peripheral edema.  Respiratory: Negative for dyspnea at rest, dyspnea on exertion, cough, sputum, wheezing.  GI: See history of present illness. GU:  Negative for dysuria, hematuria, urinary incontinence, urinary frequency, nocturnal urination.  Endo: Negative for unusual weight change.    Physical Examination:   BP 127/82  Pulse 73  Temp(Src) 98.2 F (36.8 C) (Oral)  Ht 5\' 2"  (1.575 m)  Wt 184 lb 9.6 oz  (83.734 kg)  BMI 33.76 kg/m2  General: Well-nourished, well-developed in no acute distress.  Eyes: No icterus. Mouth: Oropharyngeal mucosa moist and pink , no lesions erythema or exudate. Lungs: Clear to auscultation bilaterally.  Heart: Regular rate and rhythm, no murmurs rubs or gallops.  Abdomen: Bowel sounds are normal, nontender, nondistended, no hepatosplenomegaly or masses, no abdominal bruits or hernia , no rebound or guarding.   Extremities: No lower extremity edema. No clubbing or deformities. Neuro: Alert and oriented x 4   Skin: Warm and dry, no jaundice.   Psych: Alert and cooperative, normal mood and affect.

## 2012-09-11 NOTE — Assessment & Plan Note (Signed)
Nausea with sweets only. Discussed gastroparesis diet.

## 2012-09-11 NOTE — Assessment & Plan Note (Signed)
Mild right-sided chest wall pain with raising arm. Nonexertional. No related shortness of breath. Trial of Advil for 2 weeks. Persistent symptoms, followup with PCP.

## 2012-09-11 NOTE — Assessment & Plan Note (Addendum)
Doing well on MiraLax. Provided reassurance. Office visit in 6 months. She will be due for colonoscopy next year. Previously exam was incomplete require an air-contrast barium enema

## 2012-09-11 NOTE — Progress Notes (Signed)
Cc to [place]

## 2012-09-14 ENCOUNTER — Encounter: Payer: Self-pay | Admitting: Gastroenterology

## 2012-09-16 ENCOUNTER — Encounter: Payer: Self-pay | Admitting: Urgent Care

## 2012-09-20 ENCOUNTER — Encounter: Payer: Self-pay | Admitting: Family Medicine

## 2012-12-05 ENCOUNTER — Other Ambulatory Visit (HOSPITAL_COMMUNITY): Payer: Self-pay | Admitting: Nurse Practitioner

## 2012-12-05 DIAGNOSIS — Z139 Encounter for screening, unspecified: Secondary | ICD-10-CM

## 2012-12-11 ENCOUNTER — Ambulatory Visit (HOSPITAL_COMMUNITY)
Admission: RE | Admit: 2012-12-11 | Discharge: 2012-12-11 | Disposition: A | Discharge status: Home / Self Care | Payer: Self-pay | Source: Ambulatory Visit | Attending: Nurse Practitioner | Admitting: Nurse Practitioner

## 2012-12-11 DIAGNOSIS — Z139 Encounter for screening, unspecified: Secondary | ICD-10-CM

## 2013-03-01 ENCOUNTER — Other Ambulatory Visit: Payer: Self-pay

## 2013-03-07 ENCOUNTER — Ambulatory Visit (INDEPENDENT_AMBULATORY_CARE_PROVIDER_SITE_OTHER): Payer: Self-pay | Admitting: Gastroenterology

## 2013-03-07 ENCOUNTER — Encounter: Payer: Self-pay | Admitting: Gastroenterology

## 2013-03-07 VITALS — BP 129/77 | HR 67 | Temp 98.2°F | Wt 166.2 lb

## 2013-03-07 DIAGNOSIS — K5909 Other constipation: Secondary | ICD-10-CM

## 2013-03-07 DIAGNOSIS — K3184 Gastroparesis: Secondary | ICD-10-CM

## 2013-03-07 DIAGNOSIS — R634 Abnormal weight loss: Secondary | ICD-10-CM | POA: Insufficient documentation

## 2013-03-07 NOTE — Assessment & Plan Note (Signed)
Multifactorial. Patient desires to weigh around 140; however, she denies "trying" to lose weight. Endorses eating only one meal a day, usually fish. Discussed healthy eating habits, 5-6 small meals per day, fresh fruits, veggies, baked, broiled, grilled options. Continue to see PCP regarding thyroid evaluation. No other concerning signs noted. Return in 6 months or earlier if needed.

## 2013-03-07 NOTE — Assessment & Plan Note (Signed)
No N/V. Doing well. 6 month return.

## 2013-03-07 NOTE — Progress Notes (Signed)
cc'd to pcp 

## 2013-03-07 NOTE — Progress Notes (Signed)
Referring Provider: Raiford Simmonds., PA-C Primary Care Physician:  Raiford Simmonds., PA-C Primary GI: Dr. Gala Romney   Chief Complaint  Patient presents with  . Follow-up  . Back Pain    HPI:   [name] [name] presents today in routine follow-up with a history of constipation and idiopathic gastroparesis. Due for screening colonoscopy in 2015. Last in 2005 with poor prep. Lost almost 20 lbs since May 2014. At first denies trying to lose weight. However, towards the end of the visit, states she wants to weigh 140. Eats a lot of fish. Appetite not that great. One meal per day. Purposefully eating only one meal per day. No N/V. No GERD. No dysphagia. No abdominal pain. Miralax for constipation. Amitiza didn't work, Linzess too strong. No rectal bleeding.    Past Medical History  Diagnosis Date  . Constipation   . Nondiabetic gastroparesis   . Grave's disease     diagnosed years ago/ now hypothyroidism  . IBS (irritable colon syndrome)     constipation predominant    Past Surgical History  Procedure Laterality Date  . Cholecystectomy    . Tonsillectomy    . Tubal ligation    . S/p hysterectomy     . Esophagogastroduodenoscopy  01/2004    Dr. Almyra Free small polyps in postbulbar area ablated  . Attempted colonoscopy  03/2004    Dr. Rourk--> prep was poor. Exam to 40 cm. No gross abnormalities. Internal hemorrhoids. Air contrast barium enema was normal.    Current Outpatient Prescriptions  Medication Sig Dispense Refill  . levothyroxine (SYNTHROID, LEVOTHROID) 75 MCG tablet Take 75 mcg by mouth daily.         No current facility-administered medications for this visit.    Allergies as of 03/07/2013 - Review Complete 03/07/2013  Allergen Reaction Noted  . Codeine Nausea And Vomiting     Family History  Problem Relation Age of Onset  . Colon cancer Neg Hx   . Other Father 92    hit by train  . Heart disease Mother   . Kidney disease Mother     History   Social History   . Marital Status: Single    Spouse Name: N/A    Number of Children: 1  . Years of Education: N/A   Occupational History  . unemployed since July 3    Social History Main Topics  . Smoking status: Never Smoker   . Smokeless tobacco: None  . Alcohol Use: No  . Drug Use: No  . Sexual Activity: None   Other Topics Concern  . None   Social History Narrative   1 grown healthy daughter    Review of Systems: As mentioned in HPI.   Physical Exam: BP 129/77  Pulse 67  Temp(Src) 98.2 F (36.8 C) (Oral)  Wt 166 lb 3.2 oz (75.388 kg) General:   Alert and oriented. No distress noted. Pleasant and cooperative.  Head:  Normocephalic and atraumatic. Eyes:  Conjuctiva clear without scleral icterus. Mouth:  Oral mucosa pink and moist. Good dentition. No lesions. Heart:  S1, S2 present without murmurs, rubs, or gallops. Regular rate and rhythm. Abdomen:  +BS, soft, non-tender and non-distended. No rebound or guarding. No HSM or masses noted. Msk:  Symmetrical without gross deformities. Normal posture. Extremities:  Without edema. Neurologic:  Alert and  oriented x4;  grossly normal neurologically. Skin:  Intact without significant lesions or rashes. Psych:  Alert and cooperative. Normal mood and affect.

## 2013-03-07 NOTE — Patient Instructions (Signed)
Please complete the stool sample and return to our office.   We will see you back in 6 months!!!   Eat 5-6 small meals a day, continuing to avoid fried foods, fatty foods. Fish is a good option, as you are doing, along with chicken, baked and grilled foods, veggies, and fruits.   If the stool sample is positive for blood, we will do a colonoscopy sooner than next year.

## 2013-03-07 NOTE — Assessment & Plan Note (Signed)
Failed Amitiza, Linzess too strong. Doing well with Miralax. No overt rectal bleeding noted. As last colonoscopy was in 2005, obtain ifobt now. If positive, proceed with colonoscopy. Otherwise next screening due 2015. Return in 6 months.

## 2013-05-02 ENCOUNTER — Encounter (HOSPITAL_COMMUNITY): Payer: Self-pay | Admitting: Emergency Medicine

## 2013-05-02 ENCOUNTER — Emergency Department (HOSPITAL_COMMUNITY)
Admission: EM | Admit: 2013-05-02 | Discharge: 2013-05-02 | Disposition: A | Discharge status: Home / Self Care | Payer: Self-pay | Attending: Emergency Medicine | Admitting: Emergency Medicine

## 2013-05-02 DIAGNOSIS — R69 Illness, unspecified: Secondary | ICD-10-CM

## 2013-05-02 DIAGNOSIS — J111 Influenza due to unidentified influenza virus with other respiratory manifestations: Secondary | ICD-10-CM

## 2013-05-02 DIAGNOSIS — Z79899 Other long term (current) drug therapy: Secondary | ICD-10-CM | POA: Insufficient documentation

## 2013-05-02 DIAGNOSIS — Z8719 Personal history of other diseases of the digestive system: Secondary | ICD-10-CM | POA: Insufficient documentation

## 2013-05-02 DIAGNOSIS — E0500 Thyrotoxicosis with diffuse goiter without thyrotoxic crisis or storm: Secondary | ICD-10-CM | POA: Insufficient documentation

## 2013-05-02 MED ORDER — ACETAMINOPHEN 325 MG PO TABS
650.0000 mg | ORAL_TABLET | Freq: Once | ORAL | Status: AC
  Administered 2013-05-02: 650 mg via ORAL
  Filled 2013-05-02: qty 2

## 2013-05-02 NOTE — ED Notes (Signed)
Patient given discharge instruction, verbalized understand. Patient ambulatory out of the department.  

## 2013-05-02 NOTE — ED Provider Notes (Signed)
CSN: [id number]     Arrival date & time 05/02/13  1652 History   First MD Initiated Contact with Patient 05/02/13 1947     Chief Complaint  Patient presents with  . Generalized Body Aches   (Consider location/radiation/quality/duration/timing/severity/associated sxs/prior Treatment) HPI 48 year old female comes in today with 3 day history of nasal congestion, sore throat, cough, and body aches. She has taken over-the-counter or medicine with some relief. She denies nausea, vomiting, diarrhea, or lightheadedness. She has not had any associated abdominal pain. She has not had a fever at home. She has had no known exposure but has not had a flu shot. [facility] care. She has a history of hypothyroidism and is on Synthroid for this. She has not have a regular menstrual periods since having a hysterectomy.  Past Medical History  Diagnosis Date  . Constipation   . Nondiabetic gastroparesis   . Grave's disease     diagnosed years ago/ now hypothyroidism  . IBS (irritable colon syndrome)     constipation predominant   Past Surgical History  Procedure Laterality Date  . Cholecystectomy    . Tonsillectomy    . Tubal ligation    . S/p hysterectomy     . Esophagogastroduodenoscopy  01/2004    Dr. Almyra Free small polyps in postbulbar area ablated  . Attempted colonoscopy  03/2004    Dr. Rourk--> prep was poor. Exam to 40 cm. No gross abnormalities. Internal hemorrhoids. Air contrast barium enema was normal.   Family History  Problem Relation Age of Onset  . Colon cancer Neg Hx   . Other Father 80    hit by train  . Heart disease Mother   . Kidney disease Mother    History  Substance Use Topics  . Smoking status: Never Smoker   . Smokeless tobacco: Not on file  . Alcohol Use: No   OB History   Grav Para Term Preterm Abortions TAB SAB Ect Mult Living                 Review of Systems  All other systems reviewed and are  negative.    Allergies  Codeine  Home Medications   Current Outpatient Rx  Name  Route  Sig  Dispense  Refill  . levothyroxine (SYNTHROID, LEVOTHROID) 75 MCG tablet   Oral   Take 75 mcg by mouth daily.            BP 127/81  Pulse 99  Temp(Src) 98.8 F (37.1 C) (Oral)  Resp 20  Ht 5\' 2"  (1.575 m)  Wt 163 lb (73.936 kg)  BMI 29.81 kg/m2  SpO2 100% Physical Exam  Nursing note and vitals reviewed. Constitutional: She is oriented to person, place, and time. She appears well-developed and well-nourished.  HENT:  Head: Normocephalic and atraumatic.  Right Ear: External ear normal.  Left Ear: External ear normal.  Nose: Nose normal.  Mouth/Throat: Oropharynx is clear and moist.  Eyes: Conjunctivae and EOM are normal. Pupils are equal, round, and reactive to light.  Neck: Normal range of motion. Neck supple.  Cardiovascular: Normal rate, regular rhythm, normal heart sounds and intact distal pulses.   Pulmonary/Chest: Effort normal and breath sounds normal.  Abdominal: Soft. Bowel sounds are normal.  Musculoskeletal: Normal range of motion.  Neurological: She is alert and oriented to person, place, and time. She has normal reflexes.  Skin: Skin is warm and dry.  Psychiatric: She has a normal mood and  affect. Her behavior is normal. Thought content normal.    ED Course  Procedures (including critical care time) Labs Review Labs Reviewed - No data to display Imaging Review No results found.  EKG Interpretation   None       MDM  No diagnosis found. Patient with influenza-like symptoms who presents after 3 days of symptoms with normal exam. She is advised on symptomatic treatment and to followup and have her flu shot when well again.   Shaune Pollack, MD 05/02/13 2030

## 2013-05-02 NOTE — Discharge Instructions (Signed)
Influenza, Adult °Influenza (flu) is an infection in the mouth, nose, and throat (respiratory tract) caused by a virus. The flu can make you feel very ill. Influenza spreads easily from person to person (contagious).  °HOME CARE  °· Only take medicines as told by your doctor. °· Use a cool mist humidifier to make breathing easier. °· Get plenty of rest until your fever goes away. This usually takes 3 to 4 days. °· Drink enough fluids to keep your pee (urine) clear or pale yellow. °· Cover your mouth and nose when you cough or sneeze. °· Wash your hands well to avoid spreading the flu. °· Stay home from work or school until your fever has been gone for at least 1 full day. °· Get a flu shot every year. °GET HELP RIGHT AWAY IF:  °· You have trouble breathing or feel short of breath. °· Your skin or nails turn blue. °· You have severe neck pain or stiffness. °· You have a severe headache, facial pain, or earache. °· Your fever gets worse or keeps coming back. °· You feel sick to your stomach (nauseous), throw up (vomit), or have watery poop (diarrhea). °· You have chest pain. °· You have a deep cough that gets worse, or you cough up more thick spit (mucus). °MAKE SURE YOU:  °· Understand these instructions. °· Will watch your condition. °· Will get help right away if you are not doing well or get worse. °Document Released: 01/20/2008 Document Revised: 10/12/2011 Document Reviewed: 07/12/2011 °Exit[employer]® Patient Information ©2014 [employer]. ° °

## 2013-05-02 NOTE — ED Notes (Signed)
Pt c/o generalized body aches with n/v x 4 days

## 2013-07-31 ENCOUNTER — Encounter: Payer: Self-pay | Admitting: Internal Medicine

## 2013-09-04 ENCOUNTER — Ambulatory Visit (INDEPENDENT_AMBULATORY_CARE_PROVIDER_SITE_OTHER): Payer: Self-pay | Admitting: Gastroenterology

## 2013-09-04 ENCOUNTER — Encounter: Payer: Self-pay | Admitting: Gastroenterology

## 2013-09-04 VITALS — BP 137/82 | HR 65 | Temp 98.4°F | Ht 62.0 in | Wt 166.0 lb

## 2013-09-04 DIAGNOSIS — K5909 Other constipation: Secondary | ICD-10-CM

## 2013-09-04 DIAGNOSIS — Z1211 Encounter for screening for malignant neoplasm of colon: Secondary | ICD-10-CM

## 2013-09-04 DIAGNOSIS — K3184 Gastroparesis: Secondary | ICD-10-CM

## 2013-09-04 NOTE — Patient Instructions (Signed)
Work on getting the [employer] completed.   Please call when this is done, and we will triage you over the phone for your colonoscopy.   Have a great Summer!

## 2013-09-04 NOTE — Progress Notes (Signed)
    Referring Provider: Raiford Simmonds., PA-C Primary Care Physician:  Raiford Simmonds., PA-C Primary GI: Dr. Gala Romney   Chief Complaint  Patient presents with  . Follow-up    HPI:   [name] [name] presents today in routine follow-up with a history of constipation and idiopathic gastroparesis. Due for screening colonoscopy, last in 2005 with poor prep. No exacerbations with N/V. Appetite good. No abdominal pain. Constipation controlled with Miralax, which is the best agent for her. No rectal bleeding.   Needs to be set up for [facility] assistance.    Past Medical History  Diagnosis Date  . Nondiabetic gastroparesis   . Grave's disease     diagnosed years ago/ now hypothyroidism  . IBS (irritable colon syndrome)     constipation predominant    Past Surgical History  Procedure Laterality Date  . Cholecystectomy    . Tonsillectomy    . Tubal ligation    . S/p hysterectomy     . Esophagogastroduodenoscopy  01/2004    Dr. Almyra Free small polyps in postbulbar area ablated  . Attempted colonoscopy  03/2004    Dr. Rourk--> prep was poor. Exam to 40 cm. No gross abnormalities. Internal hemorrhoids. Air contrast barium enema was normal.    Current Outpatient Prescriptions  Medication Sig Dispense Refill  . levothyroxine (SYNTHROID, LEVOTHROID) 75 MCG tablet Take 75 mcg by mouth daily.         No current facility-administered medications for this visit.    Allergies as of 09/04/2013 - Review Complete 05/02/2013  Allergen Reaction Noted  . Codeine Nausea And Vomiting     Family History  Problem Relation Age of Onset  . Colon cancer Neg Hx   . Other Father 56    hit by train  . Heart disease Mother   . Kidney disease Mother     History   Social History  . Marital Status: Single    Spouse Name: N/A    Number of Children: 1  . Years of Education: N/A   Occupational History  . unemployed since July 3    Social History Main Topics  . Smoking status:  Never Smoker   . Smokeless tobacco: None  . Alcohol Use: No  . Drug Use: No  . Sexual Activity: None   Other Topics Concern  . None   Social History Narrative   1 grown healthy daughter    Review of Systems: As mentioned in HPI  Physical Exam: BP 137/82  Pulse 65  Temp(Src) 98.4 F (36.9 C) (Oral)  Ht 5\' 2"  (1.575 m)  Wt 166 lb (75.297 kg)  BMI 30.35 kg/m2 General:   Alert and oriented. No distress noted. Pleasant and cooperative.  Head:  Normocephalic and atraumatic. Eyes:  Conjuctiva clear without scleral icterus. Mouth:  Oral mucosa pink and moist. Good dentition. No lesions. Heart:  S1, S2 present without murmurs, rubs, or gallops. Regular rate and rhythm. Abdomen:  +BS, soft, non-tender and non-distended. No rebound or guarding. No HSM or masses noted. Msk:  Symmetrical without gross deformities. Normal posture. Extremities:  Without edema. Neurologic:  Alert and  oriented x4;  grossly normal neurologically. Skin:  Intact without significant lesions or rashes. Psych:  Alert and cooperative. Normal mood and affect.

## 2013-09-05 DIAGNOSIS — Z1211 Encounter for screening for malignant neoplasm of colon: Secondary | ICD-10-CM | POA: Insufficient documentation

## 2013-09-05 NOTE — Assessment & Plan Note (Signed)
Continue Miralax, which appears to be best agent for her.

## 2013-09-05 NOTE — Assessment & Plan Note (Signed)
Controlled with dietary modification. No exacerbations.

## 2013-09-05 NOTE — Assessment & Plan Note (Signed)
Due for routine screening now. Last in 2005. Needs insurance; looking into [facility] Patient to call us when she obtains this, and we will triage her over the phone. I have also asked nursing staff to call her in 3 months, to ensure that she is not overlooked. Risks and benefits discussed at time of appointment.

## 2013-09-07 NOTE — Progress Notes (Signed)
I called the patient to speak with her about financial assistance, however there was no answer.

## 2013-09-10 NOTE — Progress Notes (Signed)
cc'd to pcp 

## 2013-09-17 ENCOUNTER — Encounter (HOSPITAL_COMMUNITY): Payer: Self-pay | Admitting: Emergency Medicine

## 2013-09-17 ENCOUNTER — Emergency Department (HOSPITAL_COMMUNITY): Payer: Self-pay

## 2013-09-17 ENCOUNTER — Emergency Department (HOSPITAL_COMMUNITY)
Admission: EM | Admit: 2013-09-17 | Discharge: 2013-09-17 | Disposition: A | Discharge status: Home / Self Care | Payer: Self-pay | Attending: Emergency Medicine | Admitting: Emergency Medicine

## 2013-09-17 DIAGNOSIS — R109 Unspecified abdominal pain: Secondary | ICD-10-CM | POA: Insufficient documentation

## 2013-09-17 DIAGNOSIS — Y939 Activity, unspecified: Secondary | ICD-10-CM | POA: Insufficient documentation

## 2013-09-17 DIAGNOSIS — Y929 Unspecified place or not applicable: Secondary | ICD-10-CM | POA: Insufficient documentation

## 2013-09-17 DIAGNOSIS — M25539 Pain in unspecified wrist: Secondary | ICD-10-CM | POA: Insufficient documentation

## 2013-09-17 DIAGNOSIS — X58XXXA Exposure to other specified factors, initial encounter: Secondary | ICD-10-CM | POA: Insufficient documentation

## 2013-09-17 DIAGNOSIS — Z8719 Personal history of other diseases of the digestive system: Secondary | ICD-10-CM | POA: Insufficient documentation

## 2013-09-17 DIAGNOSIS — S29019A Strain of muscle and tendon of unspecified wall of thorax, initial encounter: Secondary | ICD-10-CM

## 2013-09-17 DIAGNOSIS — S239XXA Sprain of unspecified parts of thorax, initial encounter: Secondary | ICD-10-CM | POA: Insufficient documentation

## 2013-09-17 DIAGNOSIS — R51 Headache: Secondary | ICD-10-CM | POA: Insufficient documentation

## 2013-09-17 DIAGNOSIS — E039 Hypothyroidism, unspecified: Secondary | ICD-10-CM | POA: Insufficient documentation

## 2013-09-17 DIAGNOSIS — Z79899 Other long term (current) drug therapy: Secondary | ICD-10-CM | POA: Insufficient documentation

## 2013-09-17 LAB — URINALYSIS, ROUTINE W REFLEX MICROSCOPIC
Bilirubin Urine: NEGATIVE
Glucose, UA: NEGATIVE mg/dL
Hgb urine dipstick: NEGATIVE
KETONES UR: NEGATIVE mg/dL
LEUKOCYTES UA: NEGATIVE
Nitrite: NEGATIVE
PH: 7.5 (ref 5.0–8.0)
Protein, ur: NEGATIVE mg/dL
Specific Gravity, Urine: 1.02 (ref 1.005–1.030)
Urobilinogen, UA: 1 mg/dL (ref 0.0–1.0)

## 2013-09-17 LAB — CBC WITH DIFFERENTIAL/PLATELET
BASOS PCT: 1 % (ref 0–1)
Basophils Absolute: 0 10*3/uL (ref 0.0–0.1)
EOS ABS: 0.3 10*3/uL (ref 0.0–0.7)
EOS PCT: 6 % — AB (ref 0–5)
HCT: 38.7 % (ref 36.0–46.0)
Hemoglobin: 11.9 g/dL — ABNORMAL LOW (ref 12.0–15.0)
LYMPHS ABS: 2.3 10*3/uL (ref 0.7–4.0)
Lymphocytes Relative: 41 % (ref 12–46)
MCH: 26.9 pg (ref 26.0–34.0)
MCHC: 30.7 g/dL (ref 30.0–36.0)
MCV: 87.6 fL (ref 78.0–100.0)
Monocytes Absolute: 0.3 10*3/uL (ref 0.1–1.0)
Monocytes Relative: 4 % (ref 3–12)
NEUTROS PCT: 48 % (ref 43–77)
Neutro Abs: 2.8 10*3/uL (ref 1.7–7.7)
PLATELETS: 305 10*3/uL (ref 150–400)
RBC: 4.42 MIL/uL (ref 3.87–5.11)
RDW: 13.5 % (ref 11.5–15.5)
WBC: 5.7 10*3/uL (ref 4.0–10.5)

## 2013-09-17 LAB — COMPREHENSIVE METABOLIC PANEL
ALBUMIN: 3.6 g/dL (ref 3.5–5.2)
ALK PHOS: 83 U/L (ref 39–117)
ALT: 18 U/L (ref 0–35)
AST: 23 U/L (ref 0–37)
BUN: 14 mg/dL (ref 6–23)
CALCIUM: 9.4 mg/dL (ref 8.4–10.5)
CO2: 29 mEq/L (ref 19–32)
Chloride: 102 mEq/L (ref 96–112)
Creatinine, Ser: 1.22 mg/dL — ABNORMAL HIGH (ref 0.50–1.10)
GFR calc non Af Amer: 52 mL/min — ABNORMAL LOW (ref >90)
GFR, EST AFRICAN AMERICAN: 60 mL/min — AB (ref >90)
GLUCOSE: 88 mg/dL (ref 70–99)
POTASSIUM: 4 meq/L (ref 3.7–5.3)
SODIUM: 141 meq/L (ref 137–147)
TOTAL PROTEIN: 8.2 g/dL (ref 6.0–8.3)
Total Bilirubin: 0.5 mg/dL (ref 0.3–1.2)

## 2013-09-17 MED ORDER — HYDROCODONE-ACETAMINOPHEN 5-325 MG PO TABS: 2.0000 | ORAL_TABLET | ORAL | Status: DC | PRN

## 2013-09-17 MED ORDER — PROMETHAZINE HCL 25 MG PO TABS: 25.0000 mg | ORAL_TABLET | Freq: Four times a day (QID) | ORAL | Status: DC | PRN

## 2013-09-17 MED ORDER — CYCLOBENZAPRINE HCL 10 MG PO TABS: 10.0000 mg | ORAL_TABLET | Freq: Three times a day (TID) | ORAL | Status: DC | PRN

## 2013-09-17 NOTE — Discharge Instructions (Signed)

## 2013-09-17 NOTE — ED Notes (Signed)
Abd, back pain, headache and lt wrist pain, No NVD, alert,

## 2013-09-17 NOTE — ED Notes (Signed)
Pt c/o right sided mid-back pain that radiates to right ribs. Pain began Friday. Pt denies injury or heavy lifting. Pt also denies SOB, cough, fever.

## 2013-09-17 NOTE — ED Provider Notes (Signed)
CSN: [id number]     Arrival date & time 09/17/13  1607 History   First MD Initiated Contact with Patient 09/17/13 1634 This chart was scribed for Orpah Greek, * by Anastasia Pall, ED Scribe. This patient was seen in room APA06/APA06 and the patient's care was started at 4:41 PM.     Chief Complaint  Patient presents with  . Back Pain   (Consider location/radiation/quality/duration/timing/severity/associated sxs/prior Treatment) The history is provided by the patient. No language interpreter was used.   HPI Comments: [name] [name] is a 48 y.o. female who presents to the Emergency Department complaining of constant, right mid back pain, onset 3 days ago, that radiates around to her right abdomen. She reports an associated headache. She reports laying down exacerbates her back pain, along with other movement and positions. She denies eating, urinating exacerbating her pain. She denies h/o kidney stones and denies any obvious trauma to her back. She denies nausea, vomiting, diarrhea, hematuria, SOB, rash, cough, and any other associated symptoms.   PCP - MUSE,ROCHELLE D., PA-C  Past Medical History  Diagnosis Date  . Nondiabetic gastroparesis   . Grave's disease     diagnosed years ago/ now hypothyroidism  . IBS (irritable colon syndrome)     constipation predominant   Past Surgical History  Procedure Laterality Date  . Cholecystectomy    . Tonsillectomy    . Tubal ligation    . S/p hysterectomy     . Esophagogastroduodenoscopy  01/2004    Dr. Almyra Free small polyps in postbulbar area ablated  . Attempted colonoscopy  03/2004    Dr. Rourk--> prep was poor. Exam to 40 cm. No gross abnormalities. Internal hemorrhoids. Air contrast barium enema was normal.  . Abdominal hysterectomy     Family History  Problem Relation Age of Onset  . Colon cancer Neg Hx   . Other Father 10    hit by train  . Heart disease Mother   . Kidney disease Mother    History  Substance  Use Topics  . Smoking status: Never Smoker   . Smokeless tobacco: Not on file  . Alcohol Use: No   OB History   Grav Para Term Preterm Abortions TAB SAB Ect Mult Living                 Review of Systems  Constitutional: Negative for fever.  Respiratory: Negative for cough and shortness of breath.   Gastrointestinal: Positive for abdominal pain (right). Negative for nausea, vomiting and diarrhea.  Genitourinary: Negative for dysuria and hematuria.  Musculoskeletal: Positive for arthralgias (left wrist) and back pain (right lower).  Skin: Negative for rash.  Neurological: Positive for headaches.  All other systems reviewed and are negative.   Allergies  Codeine  Home Medications   Prior to Admission medications   Medication Sig Start Date End Date Taking? Authorizing Provider  levothyroxine (SYNTHROID, LEVOTHROID) 75 MCG tablet Take 75 mcg by mouth daily.      Historical Provider, MD   BP 168/77  Pulse 72  Temp(Src) 98.1 F (36.7 C) (Oral)  Resp 20  Ht 5\' 2"  (1.575 m)  Wt 166 lb (75.297 kg)  BMI 30.35 kg/m2  SpO2 100%  Physical Exam  Constitutional: She is oriented to person, place, and time. She appears well-developed and well-nourished. No distress.  HENT:  Head: Normocephalic and atraumatic.  Right Ear: Hearing normal.  Left Ear: Hearing normal.  Mouth/Throat: Mucous membranes are normal.  Eyes: Conjunctivae and EOM are  normal. Pupils are equal, round, and reactive to light.  Neck: Normal range of motion. Neck supple.  Cardiovascular: Normal rate, regular rhythm, S1 normal, S2 normal and normal heart sounds.  Exam reveals no gallop and no friction rub.   No murmur heard. Pulmonary/Chest: Effort normal and breath sounds normal. No respiratory distress. She exhibits no tenderness.  Abdominal: Soft. Normal appearance and bowel sounds are normal. There is no hepatosplenomegaly. There is no tenderness. There is no rebound, no guarding, no tenderness at McBurney's point  and negative Murphy's sign. No hernia.  Musculoskeletal: Normal range of motion. She exhibits tenderness.  Tenderness over infrascapular area and lateral thoracic area. Pain with ROM. Parapsinal muscle and soft tissue tenderness upon palpation with active spasm.   Neurological: She is alert and oriented to person, place, and time. She has normal strength. No cranial nerve deficit or sensory deficit. Coordination normal. GCS eye subscore is 4. GCS verbal subscore is 5. GCS motor subscore is 6.  Skin: Skin is warm, dry and intact. No rash noted. No cyanosis.  Psychiatric: She has a normal mood and affect. Her speech is normal and behavior is normal. Thought content normal.    ED Course  Procedures (including critical care time)  DIAGNOSTIC STUDIES: Oxygen Saturation is 100% on room air, normal by my interpretation.    COORDINATION OF CARE: 4:44 PM-Discussed treatment plan which includes DG back with pt at bedside and pt agreed to plan.   Results for orders placed during the hospital encounter of 09/17/13  CBC WITH DIFFERENTIAL      Result Value Ref Range   WBC 5.7  4.0 - 10.5 K/uL   RBC 4.42  3.87 - 5.11 MIL/uL   Hemoglobin 11.9 (*) 12.0 - 15.0 g/dL   HCT 38.7  36.0 - 46.0 %   MCV 87.6  78.0 - 100.0 fL   MCH 26.9  26.0 - 34.0 pg   MCHC 30.7  30.0 - 36.0 g/dL   RDW 13.5  11.5 - 15.5 %   Platelets 305  150 - 400 K/uL   Neutrophils Relative % 48  43 - 77 %   Neutro Abs 2.8  1.7 - 7.7 K/uL   Lymphocytes Relative 41  12 - 46 %   Lymphs Abs 2.3  0.7 - 4.0 K/uL   Monocytes Relative 4  3 - 12 %   Monocytes Absolute 0.3  0.1 - 1.0 K/uL   Eosinophils Relative 6 (*) 0 - 5 %   Eosinophils Absolute 0.3  0.0 - 0.7 K/uL   Basophils Relative 1  0 - 1 %   Basophils Absolute 0.0  0.0 - 0.1 K/uL  COMPREHENSIVE METABOLIC PANEL      Result Value Ref Range   Sodium 141  137 - 147 mEq/L   Potassium 4.0  3.7 - 5.3 mEq/L   Chloride 102  96 - 112 mEq/L   CO2 29  19 - 32 mEq/L   Glucose, Bld 88  70 -  99 mg/dL   BUN 14  6 - 23 mg/dL   Creatinine, Ser 1.22 (*) 0.50 - 1.10 mg/dL   Calcium 9.4  8.4 - 10.5 mg/dL   Total Protein 8.2  6.0 - 8.3 g/dL   Albumin 3.6  3.5 - 5.2 g/dL   AST 23  0 - 37 U/L   ALT 18  0 - 35 U/L   Alkaline Phosphatase 83  39 - 117 U/L   Total Bilirubin 0.5  0.3 - 1.2  mg/dL   GFR calc non Af Amer 52 (*) >90 mL/min   GFR calc Af Amer 60 (*) >90 mL/min  URINALYSIS, ROUTINE W REFLEX MICROSCOPIC      Result Value Ref Range   Color, Urine YELLOW  YELLOW   APPearance CLEAR  CLEAR   Specific Gravity, Urine 1.020  1.005 - 1.030   pH 7.5  5.0 - 8.0   Glucose, UA NEGATIVE  NEGATIVE mg/dL   Hgb urine dipstick NEGATIVE  NEGATIVE   Bilirubin Urine NEGATIVE  NEGATIVE   Ketones, ur NEGATIVE  NEGATIVE mg/dL   Protein, ur NEGATIVE  NEGATIVE mg/dL   Urobilinogen, UA 1.0  0.0 - 1.0 mg/dL   Nitrite NEGATIVE  NEGATIVE   Leukocytes, UA NEGATIVE  NEGATIVE   Dg Ribs Unilateral W/chest Right  09/17/2013   CLINICAL DATA:  Right rib pain  EXAM: RIGHT RIBS AND CHEST - 3+ VIEW  COMPARISON:  6/ 23/0 8  FINDINGS: Four views right ribs submitted. No acute infiltrate or pulmonary edema. No right rib fracture. Postcholecystectomy surgical clips. No pneumothorax.  IMPRESSION: Negative.   Electronically Signed   By: Lahoma Crocker M.D.   On: 09/17/2013 18:23     EKG Interpretation None     Medications - No data to display  MDM   Final diagnoses:  Thoracic myofascial strain   Patient presents to the ER for evaluation of right sided thoracic pain. Patient is experiencing pain in the infrascapular region which is clearly musculoskeletal in nature. She has not had any injury. The pain significantly worsens with palpation as well as twisting and turning of her torso, however. X-ray of lungs and ribs was negative. Urinalysis clear. She is not expressing any difficulty breathing. Vital signs are clear. Wells Criteria negative for PE. Extremely low risk for PE. Likewise no concern for her to coronary  syndrome based on clinical condition, examination and history.  Patient reassured, will treat with analgesia, rest.  I personally performed the services described in this documentation, which was scribed in my presence. The recorded information has been reviewed and is accurate.     Orpah Greek, MD 09/17/13 [phone]

## 2013-10-29 ENCOUNTER — Telehealth: Payer: Self-pay | Admitting: Internal Medicine

## 2013-10-29 NOTE — Telephone Encounter (Signed)
Pt LMOM Sunday that she needs something called in for her for nausea. [phone]

## 2013-10-29 NOTE — Telephone Encounter (Signed)
LMOM for a return call.  

## 2013-10-30 NOTE — Telephone Encounter (Signed)
I called and LMOM for a return call to discuss what is going on.

## 2013-10-30 NOTE — Telephone Encounter (Signed)
Pt called back- LM on my voicemail- tried to call her- NA- LMOM

## 2013-10-31 NOTE — Telephone Encounter (Signed)
I spoke with pt- she is having nausea when she eats, no pain, no vomiting. She is requesting something called in for nausea. She does not have a copy of the gastroparesis diet. I went over it with her and told her I would send her a copy. Copy in the mail to the pt.   Pt uses [facility]

## 2013-11-01 MED ORDER — ONDANSETRON HCL 4 MG PO TABS: 4.0000 mg | ORAL_TABLET | Freq: Three times a day (TID) | ORAL | Status: DC | PRN

## 2013-11-01 NOTE — Addendum Note (Signed)
Addended by: Orvil Feil on: 11/01/2013 12:38 PM   Modules accepted: Orders

## 2013-11-01 NOTE — Telephone Encounter (Signed)
Has she gotten [facility] assistance yet? Needs routine screening colonoscopy, ok to triage. I sent in Zofran.

## 2013-11-01 NOTE — Telephone Encounter (Signed)
She never completed the application process for assitance.    I called her to speak with her about assistance, however I had to leave a message

## 2013-11-01 NOTE — Telephone Encounter (Signed)
[name] Gros, do you know if pt got pt assistance?  [name], pt will need triage if she got pt assistance.

## 2013-11-01 NOTE — Telephone Encounter (Signed)
Patient should complete a financial application prior to screening tcs.

## 2013-11-02 NOTE — Telephone Encounter (Signed)
LMOM to call.

## 2013-11-06 NOTE — Telephone Encounter (Signed)
LMOM to call and also sent a reminder letter.

## 2014-01-03 ENCOUNTER — Telehealth: Payer: Self-pay

## 2014-01-03 NOTE — Telephone Encounter (Signed)
LMOM for pt to call. She was going to try to get [facility] Assistance and call to schedule colonoscopy.  I will mail a letter reminder to her also.

## 2014-03-04 ENCOUNTER — Encounter (HOSPITAL_COMMUNITY): Payer: Self-pay | Admitting: Emergency Medicine

## 2014-03-04 ENCOUNTER — Emergency Department (HOSPITAL_COMMUNITY)
Admission: EM | Admit: 2014-03-04 | Discharge: 2014-03-04 | Disposition: A | Discharge status: Home / Self Care | Payer: Self-pay | Attending: Emergency Medicine | Admitting: Emergency Medicine

## 2014-03-04 ENCOUNTER — Encounter: Payer: Self-pay | Admitting: Gastroenterology

## 2014-03-04 DIAGNOSIS — Z79899 Other long term (current) drug therapy: Secondary | ICD-10-CM | POA: Insufficient documentation

## 2014-03-04 DIAGNOSIS — R1013 Epigastric pain: Secondary | ICD-10-CM | POA: Insufficient documentation

## 2014-03-04 DIAGNOSIS — R112 Nausea with vomiting, unspecified: Secondary | ICD-10-CM | POA: Insufficient documentation

## 2014-03-04 DIAGNOSIS — E0500 Thyrotoxicosis with diffuse goiter without thyrotoxic crisis or storm: Secondary | ICD-10-CM | POA: Insufficient documentation

## 2014-03-04 DIAGNOSIS — Z8719 Personal history of other diseases of the digestive system: Secondary | ICD-10-CM | POA: Insufficient documentation

## 2014-03-04 DIAGNOSIS — Z90710 Acquired absence of both cervix and uterus: Secondary | ICD-10-CM | POA: Insufficient documentation

## 2014-03-04 DIAGNOSIS — Z9851 Tubal ligation status: Secondary | ICD-10-CM | POA: Insufficient documentation

## 2014-03-04 DIAGNOSIS — Z9049 Acquired absence of other specified parts of digestive tract: Secondary | ICD-10-CM | POA: Insufficient documentation

## 2014-03-04 LAB — COMPREHENSIVE METABOLIC PANEL
ALBUMIN: 3.8 g/dL (ref 3.5–5.2)
ALT: 16 U/L (ref 0–35)
ANION GAP: 10 (ref 5–15)
AST: 17 U/L (ref 0–37)
Alkaline Phosphatase: 91 U/L (ref 39–117)
BILIRUBIN TOTAL: 0.4 mg/dL (ref 0.3–1.2)
BUN: 20 mg/dL (ref 6–23)
CO2: 27 mEq/L (ref 19–32)
Calcium: 9.7 mg/dL (ref 8.4–10.5)
Chloride: 103 mEq/L (ref 96–112)
Creatinine, Ser: 1.24 mg/dL — ABNORMAL HIGH (ref 0.50–1.10)
GFR calc Af Amer: 58 mL/min — ABNORMAL LOW (ref >90)
GFR calc non Af Amer: 50 mL/min — ABNORMAL LOW (ref >90)
Glucose, Bld: 104 mg/dL — ABNORMAL HIGH (ref 70–99)
Potassium: 4.2 mEq/L (ref 3.7–5.3)
Sodium: 140 mEq/L (ref 137–147)
Total Protein: 8.6 g/dL — ABNORMAL HIGH (ref 6.0–8.3)

## 2014-03-04 LAB — CBC WITH DIFFERENTIAL/PLATELET
BASOS ABS: 0 10*3/uL (ref 0.0–0.1)
BASOS PCT: 1 % (ref 0–1)
Eosinophils Absolute: 0.4 10*3/uL (ref 0.0–0.7)
Eosinophils Relative: 7 % — ABNORMAL HIGH (ref 0–5)
HCT: 38.6 % (ref 36.0–46.0)
HEMOGLOBIN: 12 g/dL (ref 12.0–15.0)
Lymphocytes Relative: 41 % (ref 12–46)
Lymphs Abs: 2.2 10*3/uL (ref 0.7–4.0)
MCH: 27.2 pg (ref 26.0–34.0)
MCHC: 31.1 g/dL (ref 30.0–36.0)
MCV: 87.5 fL (ref 78.0–100.0)
Monocytes Absolute: 0.3 10*3/uL (ref 0.1–1.0)
Monocytes Relative: 6 % (ref 3–12)
NEUTROS PCT: 47 % (ref 43–77)
Neutro Abs: 2.5 10*3/uL (ref 1.7–7.7)
Platelets: 340 10*3/uL (ref 150–400)
RBC: 4.41 MIL/uL (ref 3.87–5.11)
RDW: 13.7 % (ref 11.5–15.5)
WBC: 5.4 10*3/uL (ref 4.0–10.5)

## 2014-03-04 LAB — LIPASE, BLOOD: Lipase: 77 U/L — ABNORMAL HIGH (ref 11–59)

## 2014-03-04 MED ORDER — PANTOPRAZOLE SODIUM 40 MG PO TBEC: 40.0000 mg | DELAYED_RELEASE_TABLET | Freq: Every day | ORAL | Status: DC

## 2014-03-04 MED ORDER — FAMOTIDINE 20 MG PO TABS
20.0000 mg | ORAL_TABLET | Freq: Once | ORAL | Status: AC
Start: 2014-03-04 — End: 2014-03-04
  Administered 2014-03-04: 20 mg via ORAL
  Filled 2014-03-04: qty 1

## 2014-03-04 MED ORDER — ONDANSETRON 4 MG PO TBDP
4.0000 mg | ORAL_TABLET | Freq: Once | ORAL | Status: AC
  Administered 2014-03-04: 4 mg via ORAL
  Filled 2014-03-04: qty 1

## 2014-03-04 MED ORDER — ONDANSETRON 4 MG PO TBDP: ORAL_TABLET | ORAL | Status: DC

## 2014-03-04 MED ORDER — GI COCKTAIL ~~LOC~~
30.0000 mL | Freq: Once | ORAL | Status: AC
  Administered 2014-03-04: 30 mL via ORAL
  Filled 2014-03-04: qty 30

## 2014-03-04 MED ORDER — FAMOTIDINE 20 MG PO TABS: 20.0000 mg | ORAL_TABLET | Freq: Two times a day (BID) | ORAL | Status: DC

## 2014-03-04 NOTE — Discharge Instructions (Signed)

## 2014-03-04 NOTE — ED Notes (Signed)
Patient complaining of upper abdominal pain x 4 days. One episode of vomiting 4 days ago. Denies vomiting / diarrhea since.

## 2014-03-04 NOTE — ED Provider Notes (Signed)
CSN: [id number]     Arrival date & time 03/04/14  [id number] History  This chart was scribed for Ephraim Hamburger, MD by Edison Simon, ED Scribe. This patient was seen in room APA06/APA06 and the patient's care was started at 7:37 AM.    Chief Complaint  Patient presents with  . Abdominal Pain   The history is provided by the patient. No language interpreter was used.    HPI Comments: [name] [name] is a 48 y.o. female with history of thyroid disorder and gastroparesis who presents to the Emergency Department complaining of constant upper abdominal pain with onset 3 days ago. She rates her pain at 7/10. She reports associated nausea and 1 episode of vomiting. She states she has not had similar symptoms previously and states it is unlike her abdominal pain from gastroporesis. She reports prior cholecystectomy and states pain is unlike that as well. She states her symptoms seem to improve when eating and that nothing makes the pain worse. She states she has not tried any medication for her pain. She denies history of diabetes. She states she uses Tylenol occasionally, but denies chronic Advil use. She denies diarrhea, constipation, fever, back pain, dysuria, or hematuria.  Past Medical History  Diagnosis Date  . Nondiabetic gastroparesis   . Grave's disease     diagnosed years ago/ now hypothyroidism  . IBS (irritable colon syndrome)     constipation predominant   Past Surgical History  Procedure Laterality Date  . Cholecystectomy    . Tonsillectomy    . Tubal ligation    . S/p hysterectomy     . Esophagogastroduodenoscopy  01/2004    Dr. Almyra Free small polyps in postbulbar area ablated  . Attempted colonoscopy  03/2004    Dr. Rourk--> prep was poor. Exam to 40 cm. No gross abnormalities. Internal hemorrhoids. Air contrast barium enema was normal.  . Abdominal hysterectomy     Family History  Problem Relation Age of Onset  . Colon cancer Neg Hx   . Other Father 58    hit by train   . Heart disease Mother   . Kidney disease Mother    History  Substance Use Topics  . Smoking status: Never Smoker   . Smokeless tobacco: Not on file  . Alcohol Use: No     Comment: occ   OB History    No data available     Review of Systems  Constitutional: Negative for fever.  Gastrointestinal: Positive for nausea, vomiting and abdominal pain. Negative for diarrhea, constipation and blood in stool.  Genitourinary: Negative for dysuria and hematuria.  Musculoskeletal: Negative for back pain.  All other systems reviewed and are negative.     Allergies  Codeine  Home Medications   Prior to Admission medications   Medication Sig Start Date End Date Taking? Authorizing Provider  acetaminophen (TYLENOL) 500 MG tablet Take 500 mg by mouth daily as needed for mild pain or moderate pain.    Historical Provider, MD  cyclobenzaprine (FLEXERIL) 10 MG tablet Take 1 tablet (10 mg total) by mouth 3 (three) times daily as needed for muscle spasms. 09/17/13   Orpah Greek, MD  HYDROcodone-acetaminophen (NORCO/VICODIN) 5-325 MG per tablet Take 2 tablets by mouth every 4 (four) hours as needed for moderate pain. 09/17/13   Orpah Greek, MD  levothyroxine (SYNTHROID, LEVOTHROID) 75 MCG tablet Take 75 mcg by mouth daily.      Historical Provider, MD  ondansetron (ZOFRAN) 4 MG tablet Take  1 tablet (4 mg total) by mouth every 8 (eight) hours as needed for nausea or vomiting. 11/01/13   Orvil Feil, NP  promethazine (PHENERGAN) 25 MG tablet Take 1 tablet (25 mg total) by mouth every 6 (six) hours as needed for nausea or vomiting. 09/17/13   Orpah Greek, MD   BP 167/90 mmHg  Pulse 74  Temp(Src) 98.1 F (36.7 C) (Oral)  Resp 16  Ht 5\' 2"  (1.575 m)  Wt 166 lb (75.297 kg)  BMI 30.35 kg/m2  SpO2 100% Physical Exam  Constitutional: She is oriented to person, place, and time. She appears well-developed and well-nourished. No distress.  HENT:  Head: Normocephalic and  atraumatic.  Eyes: Right eye exhibits no discharge. Left eye exhibits no discharge.  Cardiovascular: Normal rate, regular rhythm and normal heart sounds.   Pulmonary/Chest: Effort normal.  Abdominal: Soft. Bowel sounds are normal. She exhibits no distension and no mass. There is tenderness (mild epigastric tenderness). There is no rebound, no guarding, no CVA tenderness and negative Murphy's sign.  Neurological: She is alert and oriented to person, place, and time.  Skin: Skin is warm and dry. She is not diaphoretic.  Psychiatric: She has a normal mood and affect.  Nursing note and vitals reviewed.   ED Course  Procedures (including critical care time)  DIAGNOSTIC STUDIES: Oxygen Saturation is 100% on room air, normal by my interpretation.    COORDINATION OF CARE: 7:41 AM Discussed treatment plan with patient at beside, the patient agrees with the plan and has no further questions at this time.   Labs Review Labs Reviewed  CBC WITH DIFFERENTIAL - Abnormal; Notable for the following:    Eosinophils Relative 7 (*)    All other components within normal limits  COMPREHENSIVE METABOLIC PANEL - Abnormal; Notable for the following:    Glucose, Bld 104 (*)    Creatinine, Ser 1.24 (*)    Total Protein 8.6 (*)    GFR calc non Af Amer 50 (*)    GFR calc Af Amer 58 (*)    All other components within normal limits  LIPASE, BLOOD - Abnormal; Notable for the following:    Lipase 77 (*)    All other components within normal limits    Imaging Review No results found.   EKG Interpretation None      MDM   Final diagnoses:  Epigastric abdominal pain    Patient's pain is nonspecific, likely gastritis. Could be related to her prior history of gastroparesis. At this point her pain is under moderate control and she is stable for discharge with antacids and PPI. Will give Zofran for symptomatically relief as well. I have low suspicion for other acute pathology do not feel she needed acute  imaging at this time. Discussed strict return precautions.  I personally performed the services described in this documentation, which was scribed in my presence. The recorded information has been reviewed and is accurate.    Ephraim Hamburger, MD 03/04/14 [phone]

## 2014-03-06 ENCOUNTER — Encounter: Payer: Self-pay | Admitting: Internal Medicine

## 2014-03-06 ENCOUNTER — Telehealth: Payer: Self-pay | Admitting: Gastroenterology

## 2014-03-06 NOTE — Telephone Encounter (Signed)
APPT MADE AND LETTER SENT  °

## 2014-03-06 NOTE — Telephone Encounter (Signed)
Patient contacted me through mychart. Requesting an appointment. Please arrange an appt for patient.

## 2014-03-11 ENCOUNTER — Encounter: Payer: Self-pay | Admitting: Gastroenterology

## 2014-03-27 ENCOUNTER — Encounter: Payer: Self-pay | Admitting: Gastroenterology

## 2014-04-10 ENCOUNTER — Encounter: Payer: Self-pay | Admitting: Gastroenterology

## 2014-04-10 ENCOUNTER — Ambulatory Visit (INDEPENDENT_AMBULATORY_CARE_PROVIDER_SITE_OTHER): Payer: Self-pay | Admitting: Gastroenterology

## 2014-04-10 VITALS — BP 124/76 | HR 79 | Temp 98.0°F | Ht 62.0 in | Wt 176.8 lb

## 2014-04-10 DIAGNOSIS — K219 Gastro-esophageal reflux disease without esophagitis: Secondary | ICD-10-CM

## 2014-04-10 DIAGNOSIS — Z1211 Encounter for screening for malignant neoplasm of colon: Secondary | ICD-10-CM

## 2014-04-10 DIAGNOSIS — K5909 Other constipation: Secondary | ICD-10-CM

## 2014-04-10 DIAGNOSIS — R5382 Chronic fatigue, unspecified: Secondary | ICD-10-CM

## 2014-04-10 MED ORDER — DEXLANSOPRAZOLE 60 MG PO CPDR: 60.0000 mg | DELAYED_RELEASE_CAPSULE | Freq: Every day | ORAL | Status: DC

## 2014-04-10 NOTE — Assessment & Plan Note (Signed)
Unknown etiology. No anemia noted on recent CBC. Check B12, thiamin, Vit D. As of note, hand "shaking" intermittently without any triggering factors or pattern. No other symptoms; may ultimately need neurological referral. No other concerning signs.

## 2014-04-10 NOTE — Assessment & Plan Note (Signed)
Due for routine screening, last in 2005 with poor prep. No concerning lower GI symptoms.   Proceed with TCS with Dr. Gala Romney in near future: the risks, benefits, and alternatives have been discussed with the patient in detail. The patient states understanding and desires to proceed.

## 2014-04-10 NOTE — Patient Instructions (Addendum)
Complete the [place] assistance paper work.   Complete the blood work when the paperwork is completed.   Start taking Dexilant once each morning. I have provided a voucher to take to the pharmacy with your prescription to have a free month supply. You will need to activate the savings card first.   We have scheduled you for a colonoscopy and possible upper endoscopy (if your reflux does not improve) in the near future.     Food Choices for Gastroesophageal Reflux Disease When you have gastroesophageal reflux disease (GERD), the foods you eat and your eating habits are very important. Choosing the right foods can help ease the discomfort of GERD. WHAT GENERAL GUIDELINES DO I NEED TO FOLLOW?  Choose fruits, vegetables, whole grains, low-fat dairy products, and low-fat meat, fish, and poultry.  Limit fats such as oils, salad dressings, butter, nuts, and avocado.  Keep a food diary to identify foods that cause symptoms.  Avoid foods that cause reflux. These may be different for different people.  Eat frequent small meals instead of three large meals each day.  Eat your meals slowly, in a relaxed setting.  Limit fried foods.  Cook foods using methods other than frying.  Avoid drinking alcohol.  Avoid drinking large amounts of liquids with your meals.  Avoid bending over or lying down until 2-3 hours after eating. WHAT FOODS ARE NOT RECOMMENDED? The following are some foods and drinks that may worsen your symptoms: Vegetables Tomatoes. Tomato juice. Tomato and spaghetti sauce. Chili peppers. Onion and garlic. Horseradish. Fruits Oranges, grapefruit, and lemon (fruit and juice). Meats High-fat meats, fish, and poultry. This includes hot dogs, ribs, ham, sausage, salami, and bacon. Dairy Whole milk and chocolate milk. Sour cream. Cream. Butter. Ice cream. Cream cheese.  Beverages Coffee and tea, with or without caffeine. Carbonated beverages or energy  drinks. Condiments Hot sauce. Barbecue sauce.  Sweets/Desserts Chocolate and cocoa. Donuts. Peppermint and spearmint. Fats and Oils High-fat foods, including [place] fries and potato chips. Other Vinegar. Strong spices, such as black pepper, white pepper, red pepper, cayenne, curry powder, cloves, ginger, and chili powder. The items listed above may not be a complete list of foods and beverages to avoid. Contact your dietitian for more information. Document Released: 04/12/2005 Document Revised: 04/17/2013 Document Reviewed: 02/14/2013 [facility] Patient Information 2015 [place], [place]. This information is not intended to replace advice given to you by your health care provider. Make sure you discuss any questions you have with your health care provider.

## 2014-04-10 NOTE — Progress Notes (Signed)
Referring Provider: Raiford Simmonds., PA-C Primary Care Physician:  Raiford Simmonds., PA-C  Primary GI: Dr. Gala Romney   Chief Complaint  Patient presents with  . Follow-up    HPI:   [name] [name] presents today in follow-up with history of constipation and idiopathic gastroparesis. She is actually due for a screening colonoscopy; last colonoscopy in 2005 with poor prep.   Miralax once a day, has BM twice a day. No rectal bleeding. No abdominal pain. Notes worsening reflux. Protonix once daily, not helping with reflux symptoms. Taken for about a month. Has taken Prilosec in the past without much improvement. Reflux exacerbation daily. No dysphagia. Denies routine NSAID usage, no aspirin powders.   Notes hands shaking involuntarily, no triggers. Denies any numbness/tingling. No depression, anxiety. Feels quite fatigued. "gives out" walking from kitchen to living room.    Past Medical History  Diagnosis Date  . Nondiabetic gastroparesis   . Grave's disease     diagnosed years ago/ now hypothyroidism  . IBS (irritable colon syndrome)     constipation predominant    Past Surgical History  Procedure Laterality Date  . Cholecystectomy    . Tonsillectomy    . Tubal ligation    . S/p hysterectomy     . Esophagogastroduodenoscopy  01/2004    Dr. Almyra Free small polyps in postbulbar area ablated  . Attempted colonoscopy  03/2004    Dr. Rourk--> prep was poor. Exam to 40 cm. No gross abnormalities. Internal hemorrhoids. Air contrast barium enema was normal.  . Abdominal hysterectomy      Current Outpatient Prescriptions  Medication Sig Dispense Refill  . acetaminophen (TYLENOL) 500 MG tablet Take 500 mg by mouth daily as needed for mild pain or moderate pain.    . cyclobenzaprine (FLEXERIL) 10 MG tablet Take 1 tablet (10 mg total) by mouth 3 (three) times daily as needed for muscle spasms. 20 tablet 0  . levothyroxine (SYNTHROID, LEVOTHROID) 75 MCG tablet Take 75 mcg by  mouth daily.      . ondansetron (ZOFRAN) 4 MG tablet Take 1 tablet (4 mg total) by mouth every 8 (eight) hours as needed for nausea or vomiting. 30 tablet 1  . pantoprazole (PROTONIX) 40 MG tablet Take 1 tablet (40 mg total) by mouth daily. 30 tablet 0   No current facility-administered medications for this visit.    Allergies as of 04/10/2014 - Review Complete 04/10/2014  Allergen Reaction Noted  . Codeine Nausea And Vomiting     Family History  Problem Relation Age of Onset  . Colon cancer Neg Hx   . Other Father 51    hit by train  . Heart disease Mother   . Kidney disease Mother     History   Social History  . Marital Status: Single    Spouse Name: N/A    Number of Children: 1  . Years of Education: N/A   Occupational History  . unemployed since July 3    Social History Main Topics  . Smoking status: Never Smoker   . Smokeless tobacco: None  . Alcohol Use: No     Comment: occ  . Drug Use: No  . Sexual Activity: Yes    Birth Control/ Protection: Surgical   Other Topics Concern  . None   Social History Narrative   1 grown healthy daughter    Review of Systems: As mentioned in HPI.   Physical Exam: BP 124/76 mmHg  Pulse 79  Temp(Src) 98 F (36.7  C) (Oral)  Ht 5\' 2"  (1.575 m)  Wt 176 lb 12.8 oz (80.196 kg)  BMI 32.33 kg/m2 General:   Alert and oriented. No distress noted. Pleasant and cooperative.  Head:  Normocephalic and atraumatic. Eyes:  Conjuctiva clear without scleral icterus. Mouth:  Oral mucosa pink and moist. Good dentition. No lesions. Heart:  S1, S2 present without murmurs, rubs, or gallops. Regular rate and rhythm. Abdomen:  +BS, soft, non-tender and non-distended. No rebound or guarding. No HSM or masses noted. Msk:  Symmetrical without gross deformities. Normal posture. Extremities:  Without edema. Neurologic:  Alert and  oriented x4;  grossly normal neurologically. Skin:  Intact without significant lesions or rashes. Psych:  Alert  and cooperative. Normal mood and affect.  Lab Results  Component Value Date   WBC 5.4 03/04/2014   HGB 12.0 03/04/2014   HCT 38.6 03/04/2014   MCV 87.5 03/04/2014   PLT 340 03/04/2014   Lab Results  Component Value Date   ALT 16 03/04/2014   AST 17 03/04/2014   ALKPHOS 91 03/04/2014   BILITOT 0.4 03/04/2014   Lab Results  Component Value Date   CREATININE 1.24* 03/04/2014   BUN 20 03/04/2014   NA 140 03/04/2014   K 4.2 03/04/2014   CL 103 03/04/2014   CO2 27 03/04/2014

## 2014-04-10 NOTE — Assessment & Plan Note (Signed)
Doing well on Miralax daily. Continue current regimen. (Has trialed both Linzess and Amitiza in the past).

## 2014-04-10 NOTE — Assessment & Plan Note (Addendum)
48 year old female with worsening reflux despite Protonix once daily; no improvement in 1 month. Previously failed Prilosec. Start Dexilant once daily, 30 day sample voucher provided. Complete patient assistance papers, as patient does not have insurance. Consider EGD at time of colonoscopy if no improvement in reflux symptoms despite changing PPI and dietary modification. Risks and benefits discussed with stated understanding.

## 2014-04-10 NOTE — Progress Notes (Signed)
cc'ed to pcp °

## 2014-05-24 ENCOUNTER — Telehealth: Payer: Self-pay

## 2014-05-24 NOTE — Telephone Encounter (Signed)
Pt called this morning saying she had received a letter that it was time to schedule her tcs. Please call her at [phone]

## 2014-05-27 NOTE — Telephone Encounter (Signed)
Pt called back. She hung up before I could answer the call. I called her number and LMOM to return call.

## 2014-05-28 ENCOUNTER — Telehealth: Payer: Self-pay

## 2014-05-28 ENCOUNTER — Other Ambulatory Visit: Payer: Self-pay

## 2014-05-28 DIAGNOSIS — Z1211 Encounter for screening for malignant neoplasm of colon: Secondary | ICD-10-CM

## 2014-05-28 NOTE — Telephone Encounter (Signed)
LMOM to call.

## 2014-05-28 NOTE — Telephone Encounter (Signed)
Gastroenterology Pre-Procedure Review  Request Date: 05/28/2014 Requesting Physician: On Recall 03/2014 ( LAST COLONOSCOPY WAS ATTEMPTED 03/27/2004 WITH POOR PREP BY DR. Gala Romney  PATIENT REVIEW QUESTIONS: The patient responded to the following health history questions as indicated:    1. Diabetes Melitis: no 2. Joint replacements in the past 12 months: no 3. Major health problems in the past 3 months: no 4. Has an artificial valve or MVP: no 5. Has a defibrillator: no 6. Has been advised in past to take antibiotics in advance of a procedure like teeth cleaning: no    MEDICATIONS & ALLERGIES:    Patient reports the following regarding taking any blood thinners:   Plavix? no Aspirin? no Coumadin? no  Patient confirms/reports the following medications:  Current Outpatient Prescriptions  Medication Sig Dispense Refill  . acetaminophen (TYLENOL) 500 MG tablet Take 500 mg by mouth daily as needed for mild pain or moderate pain.    . cyclobenzaprine (FLEXERIL) 10 MG tablet Take 1 tablet (10 mg total) by mouth 3 (three) times daily as needed for muscle spasms. 20 tablet 0  . dexlansoprazole (DEXILANT) 60 MG capsule Take 1 capsule (60 mg total) by mouth daily. 30 capsule 0  . levothyroxine (SYNTHROID, LEVOTHROID) 75 MCG tablet Take 75 mcg by mouth daily.      . ondansetron (ZOFRAN) 4 MG tablet Take 1 tablet (4 mg total) by mouth every 8 (eight) hours as needed for nausea or vomiting. 30 tablet 1  . polyethylene glycol (MIRALAX / GLYCOLAX) packet Take 17 g by mouth daily.    . pantoprazole (PROTONIX) 40 MG tablet Take 1 tablet (40 mg total) by mouth daily. (Patient not taking: Reported on 05/28/2014) 30 tablet 0   No current facility-administered medications for this visit.    Patient confirms/reports the following allergies:  Allergies  Allergen Reactions  . Codeine Nausea And Vomiting    No orders of the defined types were placed in this encounter.    AUTHORIZATION INFORMATION Primary  Insurance:  ID #:   Group #:  Pre-Cert / Auth required: Pre-Cert / Auth #:   Secondary Insurance:   ID #:  Group #:  Pre-Cert / Auth required: Pre-Cert / Auth #:   SCHEDULE INFORMATION: Procedure has been scheduled as follows:  Date: 06/21/2014                Time:  7:30 am Location: [facility] Short Stay  This Gastroenterology Pre-Precedure Review Form is being routed to the following provider(s): R. Garfield Cornea, MD

## 2014-05-29 NOTE — Telephone Encounter (Signed)
Appropriate.

## 2014-05-29 NOTE — Telephone Encounter (Signed)
See separate triage.  

## 2014-05-30 ENCOUNTER — Encounter: Payer: Self-pay | Admitting: Internal Medicine

## 2014-06-03 MED ORDER — PEG-KCL-NACL-NASULF-NA ASC-C 100 G PO SOLR: 1.0000 | ORAL | Status: DC

## 2014-06-03 NOTE — Telephone Encounter (Signed)
Rx sent to the pharmacy and instructions mailed to pt.  

## 2014-06-05 ENCOUNTER — Telehealth: Payer: Self-pay

## 2014-06-05 NOTE — Telephone Encounter (Signed)
Found another contact number for pt, her sister Hassan Rowan @ [phone] and [facility] for her to give pt a message to call me.

## 2014-06-05 NOTE — Telephone Encounter (Signed)
I called pt and her phone number is not working. So I called the emergency contact, which was her mother . When I called it, it is the number for another pt so I will see if the number can be removed.    Mailing a letter for pt to call in reference to the insurance for the colonoscopy.

## 2014-06-05 NOTE — Telephone Encounter (Signed)
I called [employer]) @ [phone] and spoke to [place] B . She said no PA is required for the screening colonoscopy. However, the insurance will not pay for screening colonoscopy until pt is 49 years old ( which will be June 2016).  I told [place] that insurance usually pays for African Americans at age 81, but she said this one will not.

## 2014-06-10 ENCOUNTER — Encounter: Payer: Self-pay | Admitting: Internal Medicine

## 2014-06-11 NOTE — Telephone Encounter (Signed)
Pt had send an e-mail in reference to her insurance not paying. I have responded to the e-mail. Also, pt left Vm to call and I returned call and [facility] for her to call.

## 2014-06-11 NOTE — Telephone Encounter (Signed)
Pt called back and said she will just wait til June 2016. She is not having any problems and she does not want to be stuck with a bill that could be paid if she waits til June. I have her on my recall list for June.

## 2014-06-11 NOTE — Telephone Encounter (Signed)
Noted  

## 2014-06-18 ENCOUNTER — Encounter: Payer: Self-pay | Admitting: Internal Medicine

## 2014-06-21 ENCOUNTER — Ambulatory Visit (HOSPITAL_COMMUNITY): Admission: RE | Admit: 2014-06-21 | Payer: Self-pay | Source: Ambulatory Visit | Admitting: Internal Medicine

## 2014-06-21 ENCOUNTER — Encounter (HOSPITAL_COMMUNITY): Admission: RE | Payer: Self-pay | Source: Ambulatory Visit

## 2014-06-21 SURGERY — COLONOSCOPY
Anesthesia: Moderate Sedation

## 2014-06-25 ENCOUNTER — Encounter: Payer: Self-pay | Admitting: Gastroenterology

## 2014-06-27 NOTE — Telephone Encounter (Signed)
[name] [name]:  Can we put together samples of Linzess 145 mcg for this patient? She is coming by today to pick up.

## 2014-07-03 ENCOUNTER — Telehealth: Payer: Self-pay | Admitting: Gastroenterology

## 2014-07-03 NOTE — Telephone Encounter (Signed)
Linzess worked well for patient, but she does nto have insurance. Can we contact her and start the patient assistance for this?

## 2014-07-04 NOTE — Telephone Encounter (Signed)
Sent pt a message via my chart :)

## 2014-07-04 NOTE — Telephone Encounter (Signed)
Tried to call pt- NA 

## 2014-07-22 ENCOUNTER — Encounter: Payer: Self-pay | Admitting: Gastroenterology

## 2014-07-30 ENCOUNTER — Encounter: Payer: Self-pay | Admitting: Gastroenterology

## 2014-08-08 ENCOUNTER — Other Ambulatory Visit: Payer: Self-pay | Admitting: Gastroenterology

## 2014-08-08 MED ORDER — LINACLOTIDE 145 MCG PO CAPS: 145.0000 ug | ORAL_CAPSULE | Freq: Every day | ORAL | Status: DC

## 2014-08-19 ENCOUNTER — Emergency Department (HOSPITAL_COMMUNITY)
Admission: EM | Admit: 2014-08-19 | Discharge: 2014-08-19 | Disposition: A | Discharge status: Home / Self Care | Payer: Self-pay | Attending: Emergency Medicine | Admitting: Emergency Medicine

## 2014-08-19 ENCOUNTER — Encounter (HOSPITAL_COMMUNITY): Payer: Self-pay | Admitting: Emergency Medicine

## 2014-08-19 DIAGNOSIS — H6501 Acute serous otitis media, right ear: Secondary | ICD-10-CM | POA: Insufficient documentation

## 2014-08-19 DIAGNOSIS — Z8639 Personal history of other endocrine, nutritional and metabolic disease: Secondary | ICD-10-CM | POA: Insufficient documentation

## 2014-08-19 DIAGNOSIS — Z8719 Personal history of other diseases of the digestive system: Secondary | ICD-10-CM | POA: Insufficient documentation

## 2014-08-19 DIAGNOSIS — R0981 Nasal congestion: Secondary | ICD-10-CM | POA: Insufficient documentation

## 2014-08-19 DIAGNOSIS — Z79899 Other long term (current) drug therapy: Secondary | ICD-10-CM | POA: Insufficient documentation

## 2014-08-19 MED ORDER — AMOXICILLIN 500 MG PO CAPS: 500.0000 mg | ORAL_CAPSULE | Freq: Three times a day (TID) | ORAL | Status: DC

## 2014-08-19 MED ORDER — PSEUDOEPHEDRINE HCL 30 MG PO TABS: 30.0000 mg | ORAL_TABLET | Freq: Four times a day (QID) | ORAL | Status: DC | PRN

## 2014-08-19 NOTE — ED Notes (Signed)
Pt c/o bilat otalgia x 1 month. [facility] No improvement of symptoms.

## 2014-08-19 NOTE — ED Provider Notes (Signed)
CSN: [id number]     Arrival date & time 08/19/14  1444 History  This chart was scribed for non-physician practitioner Debroah Baller, NP, working with Davonna Belling, MD by Zola Button, ED Scribe. This patient was seen in room APFT24/APFT24 and the patient's care was started at 4:31 PM.     Chief Complaint  Patient presents with  . Otalgia   Patient is a 49 y.o. female presenting with ear pain. The history is provided by the patient. No language interpreter was used.  Otalgia Location:  Bilateral Quality:  Aching Onset quality:  Gradual Duration:  1 month Progression:  Unchanged Chronicity:  New Relieved by:  Nothing Associated symptoms: congestion   Associated symptoms: no abdominal pain, no fever, no hearing loss and no vomiting    HPI Comments: [name] [name] is a 49 y.o. female who presents to the Emergency Department complaining of gradual onset, aching bilateral ear pain radiating down the sides of her face that started about 1 month ago. Patient also reports having associated chills, sinus congestion and sinus pressure. [facility]; she was told she had an infection in her right ear and was given Claritin and Meloxicam. She was not given any antibiotics. Patient has been taking the medications but without relief. She denies fever, abdominal pain, nausea, vomiting, hearing loss, ear fullness, and any other symptoms. She also denies ear problems as a child.   Past Medical History  Diagnosis Date  . Nondiabetic gastroparesis   . Grave's disease     diagnosed years ago/ now hypothyroidism  . IBS (irritable colon syndrome)     constipation predominant   Past Surgical History  Procedure Laterality Date  . Cholecystectomy    . Tonsillectomy    . Tubal ligation    . S/p hysterectomy     . Esophagogastroduodenoscopy  01/2004    Dr. Almyra Free small polyps in postbulbar area ablated  . Attempted colonoscopy  03/2004    Dr. Rourk--> prep was  poor. Exam to 40 cm. No gross abnormalities. Internal hemorrhoids. Air contrast barium enema was normal.  . Abdominal hysterectomy     Family History  Problem Relation Age of Onset  . Colon cancer Neg Hx   . Other Father 79    hit by train  . Heart disease Mother   . Kidney disease Mother    History  Substance Use Topics  . Smoking status: Never Smoker   . Smokeless tobacco: Not on file  . Alcohol Use: No     Comment: occ   OB History    Gravida Para Term Preterm AB TAB SAB Ectopic Multiple Living   '1 1 1            '$ Review of Systems  Constitutional: Positive for chills. Negative for fever.  HENT: Positive for congestion, ear pain and sinus pressure. Negative for hearing loss.   Gastrointestinal: Negative for nausea, vomiting and abdominal pain.  All other systems reviewed and are negative.     Allergies  Codeine  Home Medications   Prior to Admission medications   Medication Sig Start Date End Date Taking? Authorizing Provider  acetaminophen (TYLENOL) 500 MG tablet Take 500 mg by mouth daily as needed for mild pain or moderate pain.    Historical Provider, MD  amoxicillin (AMOXIL) 500 MG capsule Take 1 capsule (500 mg total) by mouth 3 (three) times daily. 08/19/14   [place], NP  cyclobenzaprine (FLEXERIL) 10 MG  tablet Take 1 tablet (10 mg total) by mouth 3 (three) times daily as needed for muscle spasms. 09/17/13   Orpah Greek, MD  dexlansoprazole (DEXILANT) 60 MG capsule Take 1 capsule (60 mg total) by mouth daily. 04/10/14   Orvil Feil, NP  levothyroxine (SYNTHROID, LEVOTHROID) 88 MCG tablet Take 88 mcg by mouth daily before breakfast.    Historical Provider, MD  Linaclotide (LINZESS) 145 MCG CAPS capsule Take 1 capsule (145 mcg total) by mouth daily. 08/08/14   Mahala Menghini, PA-C  ondansetron (ZOFRAN) 4 MG tablet Take 1 tablet (4 mg total) by mouth every 8 (eight) hours as needed for nausea or vomiting. 11/01/13   Orvil Feil, NP  pantoprazole  (PROTONIX) 40 MG tablet Take 1 tablet (40 mg total) by mouth daily. Patient not taking: Reported on 05/28/2014 03/04/14   Sherwood Gambler, MD  peg 3350 powder (MOVIPREP) 100 G SOLR Take 1 kit (200 g total) by mouth as directed. 06/03/14   Daneil Dolin, MD  polyethylene glycol [facility] Floria Raveling) packet Take 17 g by mouth daily.    Historical Provider, MD  pseudoephedrine (SUDAFED) 30 MG tablet Take 1 tablet (30 mg total) by mouth every 6 (six) hours as needed for congestion. 08/19/14   Hope Bunnie Pion, NP   BP 140/85 mmHg  Pulse 81  Temp(Src) 98.6 F (37 C) (Oral)  Resp 20  Ht $R'5\' 2"'Xi$  (1.575 m)  Wt 180 lb (81.647 kg)  BMI 32.91 kg/m2  SpO2 100% Physical Exam  Constitutional: She is oriented to person, place, and time. She appears well-developed and well-nourished. No distress.  HENT:  Head: Normocephalic and atraumatic.  Left Ear: Tympanic membrane is erythematous and retracted.  Nose: Right sinus exhibits maxillary sinus tenderness. Left sinus exhibits maxillary sinus tenderness.  Mouth/Throat: Uvula is midline and oropharynx is clear and moist. No oropharyngeal exudate, posterior oropharyngeal edema or posterior oropharyngeal erythema.  Right ear: no light reflex. Left ear: light reflex present.  Eyes: Pupils are equal, round, and reactive to light.  Sclera red, injected.  Neck: Neck supple.  Cardiovascular: Normal rate.   Pulmonary/Chest: Effort normal.  Musculoskeletal: She exhibits no edema.  Neurological: She is alert and oriented to person, place, and time. No cranial nerve deficit.  Skin: Skin is warm and dry. No rash noted.  Psychiatric: She has a normal mood and affect. Her behavior is normal.  Nursing note and vitals reviewed.   ED Course  Procedures  DIAGNOSTIC STUDIES: Oxygen Saturation is 100% on room air, normal by my interpretation.    COORDINATION OF CARE: 4:35 PM-Discussed treatment plan which includes amoxicillin and sudafed with patient/guardian at bedside and  patient/guardian agreed to plan.     MDM  49 y.o. female with bilateral ear pain. Stable for d/c without mastoid tenderness, no fever, does not appear ill. Will treat for otitis media right. She will follow up with her doctor. Discussed with the patient and all questioned fully answered. She will return here if any problems arise.   Final diagnoses:  Right acute serous otitis media, recurrence not specified    I personally performed the services described in this documentation, which was scribed in my presence. The recorded information has been reviewed and is accurate.   [place], NP 08/20/14 [place], MD 08/21/14 [phone]

## 2014-09-10 ENCOUNTER — Telehealth: Payer: Self-pay | Admitting: Gastroenterology

## 2014-09-16 NOTE — Telephone Encounter (Addendum)
Please arrange appt with me to discuss colonoscopy. Thanks!

## 2014-09-19 ENCOUNTER — Encounter: Payer: Self-pay | Admitting: Internal Medicine

## 2014-09-19 NOTE — Telephone Encounter (Signed)
OV MADE WITH AS ON 10/10/14 AT 1030 AM AND APPT LETTER MAILED

## 2014-09-26 ENCOUNTER — Encounter: Payer: Self-pay | Admitting: Gastroenterology

## 2014-10-10 ENCOUNTER — Ambulatory Visit: Payer: Self-pay | Admitting: Gastroenterology

## 2014-10-11 ENCOUNTER — Emergency Department (HOSPITAL_COMMUNITY)
Admission: EM | Admit: 2014-10-11 | Discharge: 2014-10-11 | Disposition: A | Discharge status: Home / Self Care | Payer: Self-pay | Attending: Emergency Medicine | Admitting: Emergency Medicine

## 2014-10-11 ENCOUNTER — Encounter (HOSPITAL_COMMUNITY): Payer: Self-pay | Admitting: *Deleted

## 2014-10-11 DIAGNOSIS — Z792 Long term (current) use of antibiotics: Secondary | ICD-10-CM | POA: Insufficient documentation

## 2014-10-11 DIAGNOSIS — Z8719 Personal history of other diseases of the digestive system: Secondary | ICD-10-CM | POA: Insufficient documentation

## 2014-10-11 DIAGNOSIS — E039 Hypothyroidism, unspecified: Secondary | ICD-10-CM | POA: Insufficient documentation

## 2014-10-11 DIAGNOSIS — Z79899 Other long term (current) drug therapy: Secondary | ICD-10-CM | POA: Insufficient documentation

## 2014-10-11 DIAGNOSIS — H65491 Other chronic nonsuppurative otitis media, right ear: Secondary | ICD-10-CM | POA: Insufficient documentation

## 2014-10-11 MED ORDER — NEOMYCIN-POLYMYXIN-HC 3.5-10000-1 OT SOLN: 4.0000 [drp] | Freq: Four times a day (QID) | OTIC | Status: DC

## 2014-10-11 MED ORDER — CEPHALEXIN 500 MG PO CAPS: 500.0000 mg | ORAL_CAPSULE | Freq: Four times a day (QID) | ORAL | Status: DC

## 2014-10-11 NOTE — Discharge Instructions (Signed)
Otitis Media Otitis media is redness, soreness, and puffiness (swelling) in the space just behind your eardrum (middle ear). It may be caused by allergies or infection. It often happens along with a cold. HOME CARE  Take your medicine as told. Finish it even if you start to feel better.  Only take over-the-counter or prescription medicines for pain, discomfort, or fever as told by your doctor.  Follow up with your doctor as told. GET HELP IF:  You have otitis media only in one ear, or bleeding from your nose, or both.  You notice a lump on your neck.  You are not getting better in 3-5 days.  You feel worse instead of better. GET HELP RIGHT AWAY IF:   You have pain that is not helped with medicine.  You have puffiness, redness, or pain around your ear.  You get a stiff neck.  You cannot move part of your face (paralysis).  You notice that the bone behind your ear hurts when you touch it. MAKE SURE YOU:   Understand these instructions.  Will watch your condition.  Will get help right away if you are not doing well or get worse. Document Released: 09/29/2007 Document Revised: 04/17/2013 Document Reviewed: 11/07/2012 [facility] Patient Information 2015 [place], [place]. This information is not intended to replace advice given to you by your health care provider. Make sure you discuss any questions you have with your health care provider.

## 2014-10-11 NOTE — ED Notes (Signed)
Right ear pain since last visit a month ago. Was seen at [facility] after last ED visit and was put on amoxicillin and states pain is no better. Pain to entire head.

## 2014-10-13 NOTE — ED Provider Notes (Signed)
CSN: [id number]     Arrival date & time 10/11/14  4497 History   First MD Initiated Contact with Patient 10/11/14 0957     Chief Complaint  Patient presents with  . Otalgia     (Consider location/radiation/quality/duration/timing/severity/associated sxs/prior Treatment) HPI   [name] [name] is a 49 y.o. female who presents to the Emergency Department complaining of persistent right ear pain for one month.  She was seen here at [facility] and given rx for amoxil and later seen at the [facility] for same and given amoxil again, still no relief.  She describes a fullness and sharp pain to her ear that also gives her a generalized headache.  She states it feels like her ear needs to "pop"  She denies fever, dizziness, neck pain, difficulty swallowing, and facial pain.    Past Medical History  Diagnosis Date  . Nondiabetic gastroparesis   . Grave's disease     diagnosed years ago/ now hypothyroidism  . IBS (irritable colon syndrome)     constipation predominant   Past Surgical History  Procedure Laterality Date  . Cholecystectomy    . Tonsillectomy    . Tubal ligation    . S/p hysterectomy     . Esophagogastroduodenoscopy  01/2004    Dr. Almyra Free small polyps in postbulbar area ablated  . Attempted colonoscopy  03/2004    Dr. Rourk--> prep was poor. Exam to 40 cm. No gross abnormalities. Internal hemorrhoids. Air contrast barium enema was normal.  . Abdominal hysterectomy     Family History  Problem Relation Age of Onset  . Colon cancer Neg Hx   . Other Father 35    hit by train  . Heart disease Mother   . Kidney disease Mother    History  Substance Use Topics  . Smoking status: Never Smoker   . Smokeless tobacco: Not on file  . Alcohol Use: No     Comment: occ   OB History    Gravida Para Term Preterm AB TAB SAB Ectopic Multiple Living   _0 Review of Systems  Constitutional: Negative for fever, chills, activity change and appetite change.  HENT:  Positive for ear pain. Negative for congestion, ear discharge, facial swelling, rhinorrhea, sore throat and trouble swallowing.   Eyes: Negative for visual disturbance.  Respiratory: Negative for cough, shortness of breath, wheezing and stridor.   Gastrointestinal: Negative for nausea and vomiting.  Musculoskeletal: Negative for neck pain and neck stiffness.  Skin: Negative.   Neurological: Negative for dizziness, weakness, numbness and headaches.  Hematological: Negative for adenopathy.  Psychiatric/Behavioral: Negative for confusion.  All other systems reviewed and are negative.     Allergies  Codeine  Home Medications   Prior to Admission medications   Medication Sig Start Date End Date Taking? Authorizing Provider  acetaminophen (TYLENOL) 500 MG tablet Take 500 mg by mouth daily as needed for mild pain or moderate pain.   Yes Historical Provider, MD  cyclobenzaprine (FLEXERIL) 10 MG tablet Take 1 tablet (10 mg total) by mouth 3 (three) times daily as needed for muscle spasms. 09/17/13  Yes Orpah Greek, MD  levothyroxine (SYNTHROID, LEVOTHROID) 88 MCG tablet Take 88 mcg by mouth daily before breakfast.   Yes Historical Provider, MD  ondansetron (ZOFRAN) 4 MG tablet Take 1 tablet (4 mg total) by mouth every 8 (eight) hours as needed for nausea or vomiting. 11/01/13  Yes Leandra Kern  [place], NP  polyethylene glycol (MIRALAX / GLYCOLAX) packet Take 17 g by mouth daily.   Yes Historical Provider, MD  amoxicillin (AMOXIL) 500 MG capsule Take 1 capsule (500 mg total) by mouth 3 (three) times daily. Patient not taking: Reported on 10/11/2014 08/19/14   Ashley Murrain, NP  cephALEXin (KEFLEX) 500 MG capsule Take 1 capsule (500 mg total) by mouth 4 (four) times daily. For 7 days 10/11/14   Traniyah Hallett, PA-C  dexlansoprazole (DEXILANT) 60 MG capsule Take 1 capsule (60 mg total) by mouth daily. Patient not taking: Reported on 10/11/2014 04/10/14   Orvil Feil, NP  Linaclotide [facility]) 145 MCG  CAPS capsule Take 1 capsule (145 mcg total) by mouth daily. Patient not taking: Reported on 10/11/2014 08/08/14   Mahala Menghini, PA-C  neomycin-polymyxin-hydrocortisone (CORTISPORIN) otic solution Place 4 drops into the right ear 4 (four) times daily. For 10 days 10/11/14   Moise Friday, PA-C  pantoprazole (PROTONIX) 40 MG tablet Take 1 tablet (40 mg total) by mouth daily. Patient not taking: Reported on 05/28/2014 03/04/14   Sherwood Gambler, MD  peg 3350 powder (MOVIPREP) 100 G SOLR Take 1 kit (200 g total) by mouth as directed. Patient not taking: Reported on 10/11/2014 06/03/14   Daneil Dolin, MD  pseudoephedrine (SUDAFED) 30 MG tablet Take 1 tablet (30 mg total) by mouth every 6 (six) hours as needed for congestion. Patient not taking: Reported on 10/11/2014 08/19/14   Ashley Murrain, NP   BP 141/73 mmHg  Pulse 77  Temp(Src) 98.5 F (36.9 C) (Oral)  Resp 16  Ht _0  (1.575 m)  Wt 176 lb (79.833 kg)  BMI 32.18 kg/m2  SpO2 99% Physical Exam  Constitutional: She is oriented to person, place, and time. She appears well-developed and well-nourished. No distress.  HENT:  Head: Normocephalic and atraumatic.  Mouth/Throat: Uvula is midline, oropharynx is clear and moist and mucous membranes are normal. No uvula swelling. No oropharyngeal exudate.  Mild erythema and dullness of the right TM.  Mild middle ear effusion present.  No drainage or edema of the ear canal, TM appears intact.  mastoid is non-tender  Neck: Normal range of motion. Neck supple.  Cardiovascular: Normal rate, regular rhythm, normal heart sounds and intact distal pulses.   No murmur heard. Pulmonary/Chest: Effort normal and breath sounds normal. No stridor. No respiratory distress.  Musculoskeletal: Normal range of motion.  Lymphadenopathy:    She has no cervical adenopathy.  Neurological: She is alert and oriented to person, place, and time. Coordination normal.  Skin: Skin is warm and dry. No rash noted.  Nursing note and  vitals reviewed.   ED Course  Procedures (including critical care time) Labs Review Labs Reviewed - No data to display  Imaging Review No results found.   EKG Interpretation None      MDM   Final diagnoses:  Other chronic nonsuppurative otitis media of right ear    Recurrent, chronic right OM.  Seen here and health dept, treated with Amoxil on both occurences.  No mastoid tenderness.  Well appearing, Has to pay out of pocket of medications, will try keflex and cortisporin otic.  Given ENT referral.      Kem Parkinson, PA-C 10/13/14 [id number]  Fredia Sorrow, MD 10/13/14 1212

## 2014-10-15 ENCOUNTER — Encounter: Payer: Self-pay | Admitting: Gastroenterology

## 2014-10-15 ENCOUNTER — Other Ambulatory Visit: Payer: Self-pay

## 2014-10-15 ENCOUNTER — Ambulatory Visit (INDEPENDENT_AMBULATORY_CARE_PROVIDER_SITE_OTHER): Payer: Self-pay | Admitting: Gastroenterology

## 2014-10-15 VITALS — BP 132/79 | HR 76 | Temp 97.1°F | Ht 62.0 in | Wt 184.2 lb

## 2014-10-15 DIAGNOSIS — R1314 Dysphagia, pharyngoesophageal phase: Secondary | ICD-10-CM

## 2014-10-15 DIAGNOSIS — K5909 Other constipation: Secondary | ICD-10-CM

## 2014-10-15 DIAGNOSIS — R1319 Other dysphagia: Secondary | ICD-10-CM | POA: Insufficient documentation

## 2014-10-15 DIAGNOSIS — K219 Gastro-esophageal reflux disease without esophagitis: Secondary | ICD-10-CM

## 2014-10-15 DIAGNOSIS — Z1211 Encounter for screening for malignant neoplasm of colon: Secondary | ICD-10-CM

## 2014-10-15 DIAGNOSIS — R1310 Dysphagia, unspecified: Secondary | ICD-10-CM

## 2014-10-15 NOTE — Patient Instructions (Signed)
Colonoscopy and upper endoscopy as scheduled. See separate instructions.

## 2014-10-15 NOTE — Progress Notes (Signed)
Primary Care Physician:  Raiford Simmonds., PA-C  Primary Gastroenterologist:  Garfield Cornea, MD   Chief Complaint  Patient presents with  . set up TCS    HPI:  [name] [name] is a 49 y.o. female here to schedule colonoscopy. She had attempted colonoscopy in 2005 but was incomplete due to poor prep. Follow-up barium enema the time was normal. Patient was seen back in December 2015 for constipation and idiopathic gastroparesis. Outside of having worsening reflux she was doing pretty well. Advised to have an updated screening colonoscopy but her insurance would not pay for it until she turned 89 even though guidelines indicate that she should have one at age 25 or 10 years from the past one in 2005.  Currently have BM twice daily. Soft stool. No melena, brbpr. Takes Miralax one capful daily and sometimes use Linzess samples when available. She has no medication coverage and her insurance only covers preventative care.   Denies abdominal pain. Continues to feel reflux/discomfort in substernal region and inbetween shoulder blades. Feels lump in throat with swallowing. Going on for several months. Dysphagia with everything. Had tried protonix and failed. Previously failed prilosec. Given dexilant samples but unable to afford without medication coverage. Complains of nausea.   Also concerned about episodes of feeling sweaty, nausea, lightheadedness. Happens when she first gets to work. Passes if she puts a piece of candy in her mouth. Dietary recall indicates that she eats a bowel of cereal ([employer] Patient) for breakfast. Suspect she feeling the effects of epiglottis see me related to insulin surge from sugary breakfast. These episodes only happen after breakfast.  Current Outpatient Prescriptions  Medication Sig Dispense Refill  . acetaminophen (TYLENOL) 500 MG tablet Take 500 mg by mouth daily as needed for mild pain or moderate pain.    . cephALEXin (KEFLEX) 500 MG capsule Take 1 capsule (500 mg  total) by mouth 4 (four) times daily. For 7 days 28 capsule 0  . cyclobenzaprine (FLEXERIL) 10 MG tablet Take 1 tablet (10 mg total) by mouth 3 (three) times daily as needed for muscle spasms. 20 tablet 0  . levothyroxine (SYNTHROID, LEVOTHROID) 88 MCG tablet Take 88 mcg by mouth daily before breakfast.    . Linaclotide (LINZESS) 145 MCG CAPS capsule Take 1 capsule (145 mcg total) by mouth daily. 30 capsule 11  . neomycin-polymyxin-hydrocortisone (CORTISPORIN) otic solution Place 4 drops into the right ear 4 (four) times daily. For 10 days 10 mL 0  . ondansetron (ZOFRAN) 4 MG tablet Take 1 tablet (4 mg total) by mouth every 8 (eight) hours as needed for nausea or vomiting. 30 tablet 1  . polyethylene glycol (MIRALAX / GLYCOLAX) packet Take 17 g by mouth daily.     No current facility-administered medications for this visit.    Allergies as of 10/15/2014 - Review Complete 10/15/2014  Allergen Reaction Noted  . Codeine Nausea And Vomiting     Past Medical History  Diagnosis Date  . Nondiabetic gastroparesis   . Grave's disease     diagnosed years ago/ now hypothyroidism  . IBS (irritable colon syndrome)     constipation predominant    Past Surgical History  Procedure Laterality Date  . Cholecystectomy    . Tonsillectomy    . Tubal ligation    . S/p hysterectomy     . Esophagogastroduodenoscopy  01/2004    Dr. Almyra Free small polyps in postbulbar area ablated  . Attempted colonoscopy  03/2004    Dr. Rourk--> prep was poor.  Exam to 40 cm. No gross abnormalities. Internal hemorrhoids. Air contrast barium enema was normal.  . Abdominal hysterectomy      Family History  Problem Relation Age of Onset  . Colon cancer Neg Hx   . Other Father 103    hit by train  . Heart disease Mother   . Kidney disease Mother     History   Social History  . Marital Status: Single    Spouse Name: N/A  . Number of Children: 1  . Years of Education: N/A   Occupational History  .  unemployed since July 3    Social History Main Topics  . Smoking status: Never Smoker   . Smokeless tobacco: Not on file  . Alcohol Use: No     Comment: occ  . Drug Use: No  . Sexual Activity: Yes    Birth Control/ Protection: Surgical   Other Topics Concern  . Not on file   Social History Narrative   1 grown healthy daughter      ROS:  General: Negative for anorexia, weight loss, fever, chills, fatigue, weakness. Eyes: Negative for vision changes.  ENT: Negative for hoarseness, difficulty swallowing , nasal congestion. CV: Negative for chest pain, angina, palpitations, dyspnea on exertion, peripheral edema.  Respiratory: Negative for dyspnea at rest, dyspnea on exertion, cough, sputum, wheezing.  GI: See history of present illness. GU:  Negative for dysuria, hematuria, urinary incontinence, urinary frequency, nocturnal urination.  MS: Negative for joint pain, low back pain.  Derm: Negative for rash or itching.  Neuro: Negative for weakness, abnormal sensation, seizure, frequent headaches, memory loss, confusion.  Psych: Negative for anxiety, depression, suicidal ideation, hallucinations.  Endo: Negative for unusual weight change.  Heme: Negative for bruising or bleeding. Allergy: Negative for rash or hives.    Physical Examination:  BP 132/79 mmHg  Pulse 76  Temp(Src) 97.1 F (36.2 C)  Ht 5\' 2"  (1.575 m)  Wt 184 lb 3.2 oz (83.553 kg)  BMI 33.68 kg/m2   General: Well-nourished, well-developed in no acute distress.  Head: Normocephalic, atraumatic.   Eyes: Conjunctiva pink, no icterus. Mouth: Oropharyngeal mucosa moist and pink , no lesions erythema or exudate. Neck: Supple without thyromegaly, masses, or lymphadenopathy.  Lungs: Clear to auscultation bilaterally.  Heart: Regular rate and rhythm, no murmurs rubs or gallops.  Abdomen: Bowel sounds are normal, nontender, nondistended, no hepatosplenomegaly or masses, no abdominal bruits or    hernia , no rebound or  guarding.   Rectal: not performed. Extremities: No lower extremity edema. No clubbing or deformities.  Neuro: Alert and oriented x 4 , grossly normal neurologically.  Skin: Warm and dry, no rash or jaundice.   Psych: Alert and cooperative, normal mood and affect.  Imaging Studies: No results found.

## 2014-10-16 NOTE — Assessment & Plan Note (Signed)
Complains of lump in her throat with and without food. Worse with meals. Complains of food, pills, liquids sticking in her chest. Suspect element of globus hystericus +/- GERD. Cannot exclude esophageal web, ring, stricture, Zenker's diverticulum, or due to thyroid.   Egd+/- esophageal dilation in near future.  I have discussed the risks, alternatives, benefits with regards to but not limited to the risk of reaction to medication, bleeding, infection, perforation and the patient is agreeable to proceed. Written consent to be obtained.

## 2014-10-16 NOTE — Progress Notes (Signed)
cc'd to pcp 

## 2014-10-16 NOTE — Assessment & Plan Note (Signed)
Screening colonoscopy in near future.  I have discussed the risks, alternatives, benefits with regards to but not limited to the risk of reaction to medication, bleeding, infection, perforation and the patient is agreeable to proceed. Written consent to be obtained.

## 2014-10-16 NOTE — Assessment & Plan Note (Signed)
Doing well with miralax. Continue current regimen.

## 2014-10-30 ENCOUNTER — Encounter (HOSPITAL_COMMUNITY): Payer: Self-pay | Admitting: *Deleted

## 2014-10-30 ENCOUNTER — Encounter (HOSPITAL_COMMUNITY)
Admission: RE | Disposition: A | Discharge status: Home / Self Care | Payer: Self-pay | Source: Ambulatory Visit | Attending: Internal Medicine

## 2014-10-30 ENCOUNTER — Ambulatory Visit (HOSPITAL_COMMUNITY)
Admission: RE | Admit: 2014-10-30 | Discharge: 2014-10-30 | Disposition: A | Discharge status: Home / Self Care | Payer: Self-pay | Source: Ambulatory Visit | Attending: Internal Medicine | Admitting: Internal Medicine

## 2014-10-30 DIAGNOSIS — Z79899 Other long term (current) drug therapy: Secondary | ICD-10-CM | POA: Insufficient documentation

## 2014-10-30 DIAGNOSIS — K3189 Other diseases of stomach and duodenum: Secondary | ICD-10-CM

## 2014-10-30 DIAGNOSIS — E0500 Thyrotoxicosis with diffuse goiter without thyrotoxic crisis or storm: Secondary | ICD-10-CM | POA: Insufficient documentation

## 2014-10-30 DIAGNOSIS — Z1211 Encounter for screening for malignant neoplasm of colon: Secondary | ICD-10-CM | POA: Insufficient documentation

## 2014-10-30 DIAGNOSIS — K219 Gastro-esophageal reflux disease without esophagitis: Secondary | ICD-10-CM | POA: Insufficient documentation

## 2014-10-30 DIAGNOSIS — K5900 Constipation, unspecified: Secondary | ICD-10-CM | POA: Insufficient documentation

## 2014-10-30 DIAGNOSIS — K6289 Other specified diseases of anus and rectum: Secondary | ICD-10-CM | POA: Insufficient documentation

## 2014-10-30 DIAGNOSIS — E039 Hypothyroidism, unspecified: Secondary | ICD-10-CM | POA: Insufficient documentation

## 2014-10-30 DIAGNOSIS — K2950 Unspecified chronic gastritis without bleeding: Secondary | ICD-10-CM | POA: Insufficient documentation

## 2014-10-30 DIAGNOSIS — K3184 Gastroparesis: Secondary | ICD-10-CM | POA: Insufficient documentation

## 2014-10-30 DIAGNOSIS — Z9049 Acquired absence of other specified parts of digestive tract: Secondary | ICD-10-CM | POA: Insufficient documentation

## 2014-10-30 DIAGNOSIS — K589 Irritable bowel syndrome without diarrhea: Secondary | ICD-10-CM | POA: Insufficient documentation

## 2014-10-30 DIAGNOSIS — R1314 Dysphagia, pharyngoesophageal phase: Secondary | ICD-10-CM | POA: Insufficient documentation

## 2014-10-30 DIAGNOSIS — Z792 Long term (current) use of antibiotics: Secondary | ICD-10-CM | POA: Insufficient documentation

## 2014-10-30 DIAGNOSIS — Z7952 Long term (current) use of systemic steroids: Secondary | ICD-10-CM | POA: Insufficient documentation

## 2014-10-30 HISTORY — PX: ESOPHAGEAL DILATION: SHX303

## 2014-10-30 HISTORY — PX: ESOPHAGOGASTRODUODENOSCOPY: SHX5428

## 2014-10-30 HISTORY — PX: COLONOSCOPY: SHX5424

## 2014-10-30 SURGERY — COLONOSCOPY
Anesthesia: Moderate Sedation

## 2014-10-30 MED ORDER — SODIUM CHLORIDE 0.9 % IV SOLN
INTRAVENOUS | Status: DC
  Administered 2014-10-30: 10:00:00 via INTRAVENOUS

## 2014-10-30 MED ORDER — STERILE WATER FOR IRRIGATION IR SOLN
Status: DC | PRN
  Administered 2014-10-30: 11:00:00

## 2014-10-30 MED ORDER — MEPERIDINE HCL 100 MG/ML IJ SOLN
INTRAMUSCULAR | Status: DC | PRN
  Administered 2014-10-30: 50 mg via INTRAVENOUS
  Administered 2014-10-30: 25 mg via INTRAVENOUS
  Administered 2014-10-30: 50 mg via INTRAVENOUS

## 2014-10-30 MED ORDER — MEPERIDINE HCL 100 MG/ML IJ SOLN
INTRAMUSCULAR | Status: AC
  Filled 2014-10-30: qty 2

## 2014-10-30 MED ORDER — MIDAZOLAM HCL 5 MG/5ML IJ SOLN
INTRAMUSCULAR | Status: DC | PRN
  Administered 2014-10-30 (×2): 1 mg via INTRAVENOUS
  Administered 2014-10-30: 2 mg via INTRAVENOUS
  Administered 2014-10-30: 1 mg via INTRAVENOUS
  Administered 2014-10-30: 2 mg via INTRAVENOUS

## 2014-10-30 MED ORDER — LIDOCAINE VISCOUS 2 % MT SOLN
OROMUCOSAL | Status: AC
  Filled 2014-10-30: qty 15

## 2014-10-30 MED ORDER — LIDOCAINE VISCOUS 2 % MT SOLN
OROMUCOSAL | Status: DC | PRN
  Administered 2014-10-30: 3 mL via OROMUCOSAL

## 2014-10-30 MED ORDER — ONDANSETRON HCL 4 MG/2ML IJ SOLN
INTRAMUSCULAR | Status: DC | PRN
  Administered 2014-10-30: 4 mg via INTRAVENOUS

## 2014-10-30 MED ORDER — ONDANSETRON HCL 4 MG/2ML IJ SOLN
INTRAMUSCULAR | Status: AC
  Filled 2014-10-30: qty 2

## 2014-10-30 MED ORDER — MIDAZOLAM HCL 5 MG/5ML IJ SOLN
INTRAMUSCULAR | Status: AC
  Filled 2014-10-30: qty 10

## 2014-10-30 NOTE — Discharge Instructions (Addendum)
Colonoscopy Discharge Instructions  Read the instructions outlined below and refer to this sheet in the next few weeks. These discharge instructions provide you with general information on caring for yourself after you leave the hospital. Your doctor may also give you specific instructions. While your treatment has been planned according to the most current medical practices available, unavoidable complications occasionally occur. If you have any problems or questions after discharge, call Dr. Gala Romney at [phone]. ACTIVITY  You may resume your regular activity, but move at a slower pace for the next 24 hours.   Take frequent rest periods for the next 24 hours.   Walking will help get rid of the air and reduce the bloated feeling in your belly (abdomen).   No driving for 24 hours (because of the medicine (anesthesia) used during the test).    Do not sign any important legal documents or operate any machinery for 24 hours (because of the anesthesia used during the test).  NUTRITION  Drink plenty of fluids.   You may resume your normal diet as instructed by your doctor.   Begin with a light meal and progress to your normal diet. Heavy or fried foods are harder to digest and may make you feel sick to your stomach (nauseated).   Avoid alcoholic beverages for 24 hours or as instructed.  MEDICATIONS  You may resume your normal medications unless your doctor tells you otherwise.  WHAT YOU CAN EXPECT TODAY  Some feelings of bloating in the abdomen.   Passage of more gas than usual.   Spotting of blood in your stool or on the toilet paper.  IF YOU HAD POLYPS REMOVED DURING THE COLONOSCOPY:  No aspirin products for 7 days or as instructed.   No alcohol for 7 days or as instructed.   Eat a soft diet for the next 24 hours.  FINDING OUT THE RESULTS OF YOUR TEST Not all test results are available during your visit. If your test results are not back during the visit, make an appointment  with your caregiver to find out the results. Do not assume everything is normal if you have not heard from your caregiver or the medical facility. It is important for you to follow up on all of your test results.  SEEK IMMEDIATE MEDICAL ATTENTION IF:  You have more than a spotting of blood in your stool.   Your belly is swollen (abdominal distention).   You are nauseated or vomiting.   You have a temperature over 101.  You have abdominal pain or discomfort that is severe or gets worse throughout the day. EGD Discharge instructions Please read the instructions outlined below and refer to this sheet in the next few weeks. These discharge instructions provide you with general information on caring for yourself after you leave the hospital. Your doctor may also give you specific instructions. While your treatment has been planned according to the most current medical practices available, unavoidable complications occasionally occur. If you have any problems or questions after discharge, please call your doctor. ACTIVITY You may resume your regular activity but move at a slower pace for the next 24 hours.  Take frequent rest periods for the next 24 hours.  Walking will help expel (get rid of) the air and reduce the bloated feeling in your abdomen.  No driving for 24 hours (because of the anesthesia (medicine) used during the test).  You may shower.  Do not sign any important legal documents or operate any machinery for 24  any important legal documents or operate any machinery for 24 hours (because of the anesthesia used during the test).  °NUTRITION °· Drink plenty of fluids.  °· You may resume your normal diet.  °· Begin with a light meal and progress to your normal diet.  °· Avoid alcoholic beverages for 24 hours or as instructed by your caregiver.  °MEDICATIONS °· You may resume your normal medications unless your caregiver tells you otherwise.  °WHAT YOU CAN EXPECT TODAY °· You may experience abdominal discomfort such as a feeling of  fullness or “gas” pains.  °FOLLOW-UP °· Your doctor will discuss the results of your test with you.  °SEEK IMMEDIATE MEDICAL ATTENTION IF ANY OF THE FOLLOWING OCCUR: °· Excessive nausea (feeling sick to your stomach) and/or vomiting.  °· Severe abdominal pain and distention (swelling).  °· Trouble swallowing.  °· Temperature over 101° F (37.8º C).  °· Rectal bleeding or vomiting of blood.  ° ° °Further recommendations to follow pending review of pathology report ° °

## 2014-10-30 NOTE — H&P (View-Only) (Signed)
Primary Care Physician:  Raiford Simmonds., PA-C  Primary Gastroenterologist:  Garfield Cornea, MD   Chief Complaint  Patient presents with  . set up TCS    HPI:  [name] [name] is a 49 y.o. female here to schedule colonoscopy. She had attempted colonoscopy in 2005 but was incomplete due to poor prep. Follow-up barium enema the time was normal. Patient was seen back in December 2015 for constipation and idiopathic gastroparesis. Outside of having worsening reflux she was doing pretty well. Advised to have an updated screening colonoscopy but her insurance would not pay for it until she turned 44 even though guidelines indicate that she should have one at age 34 or 10 years from the past one in 2005.  Currently have BM twice daily. Soft stool. No melena, brbpr. Takes Miralax one capful daily and sometimes use Linzess samples when available. She has no medication coverage and her insurance only covers preventative care.   Denies abdominal pain. Continues to feel reflux/discomfort in substernal region and inbetween shoulder blades. Feels lump in throat with swallowing. Going on for several months. Dysphagia with everything. Had tried protonix and failed. Previously failed prilosec. Given dexilant samples but unable to afford without medication coverage. Complains of nausea.   Also concerned about episodes of feeling sweaty, nausea, lightheadedness. Happens when she first gets to work. Passes if she puts a piece of candy in her mouth. Dietary recall indicates that she eats a bowel of cereal ([employer] Patient) for breakfast. Suspect she feeling the effects of epiglottis see me related to insulin surge from sugary breakfast. These episodes only happen after breakfast.  Current Outpatient Prescriptions  Medication Sig Dispense Refill  . acetaminophen (TYLENOL) 500 MG tablet Take 500 mg by mouth daily as needed for mild pain or moderate pain.    . cephALEXin (KEFLEX) 500 MG capsule Take 1 capsule (500 mg  total) by mouth 4 (four) times daily. For 7 days 28 capsule 0  . cyclobenzaprine (FLEXERIL) 10 MG tablet Take 1 tablet (10 mg total) by mouth 3 (three) times daily as needed for muscle spasms. 20 tablet 0  . levothyroxine (SYNTHROID, LEVOTHROID) 88 MCG tablet Take 88 mcg by mouth daily before breakfast.    . Linaclotide (LINZESS) 145 MCG CAPS capsule Take 1 capsule (145 mcg total) by mouth daily. 30 capsule 11  . neomycin-polymyxin-hydrocortisone (CORTISPORIN) otic solution Place 4 drops into the right ear 4 (four) times daily. For 10 days 10 mL 0  . ondansetron (ZOFRAN) 4 MG tablet Take 1 tablet (4 mg total) by mouth every 8 (eight) hours as needed for nausea or vomiting. 30 tablet 1  . polyethylene glycol (MIRALAX / GLYCOLAX) packet Take 17 g by mouth daily.     No current facility-administered medications for this visit.    Allergies as of 10/15/2014 - Review Complete 10/15/2014  Allergen Reaction Noted  . Codeine Nausea And Vomiting     Past Medical History  Diagnosis Date  . Nondiabetic gastroparesis   . Grave's disease     diagnosed years ago/ now hypothyroidism  . IBS (irritable colon syndrome)     constipation predominant    Past Surgical History  Procedure Laterality Date  . Cholecystectomy    . Tonsillectomy    . Tubal ligation    . S/p hysterectomy     . Esophagogastroduodenoscopy  01/2004    Dr. Almyra Free small polyps in postbulbar area ablated  . Attempted colonoscopy  03/2004    Dr. Rourk--> prep was poor.  Exam to 40 cm. No gross abnormalities. Internal hemorrhoids. Air contrast barium enema was normal.  . Abdominal hysterectomy      Family History  Problem Relation Age of Onset  . Colon cancer Neg Hx   . Other Father 68    hit by train  . Heart disease Mother   . Kidney disease Mother     History   Social History  . Marital Status: Single    Spouse Name: N/A  . Number of Children: 1  . Years of Education: N/A   Occupational History  .  unemployed since July 3    Social History Main Topics  . Smoking status: Never Smoker   . Smokeless tobacco: Not on file  . Alcohol Use: No     Comment: occ  . Drug Use: No  . Sexual Activity: Yes    Birth Control/ Protection: Surgical   Other Topics Concern  . Not on file   Social History Narrative   1 grown healthy daughter      ROS:  General: Negative for anorexia, weight loss, fever, chills, fatigue, weakness. Eyes: Negative for vision changes.  ENT: Negative for hoarseness, difficulty swallowing , nasal congestion. CV: Negative for chest pain, angina, palpitations, dyspnea on exertion, peripheral edema.  Respiratory: Negative for dyspnea at rest, dyspnea on exertion, cough, sputum, wheezing.  GI: See history of present illness. GU:  Negative for dysuria, hematuria, urinary incontinence, urinary frequency, nocturnal urination.  MS: Negative for joint pain, low back pain.  Derm: Negative for rash or itching.  Neuro: Negative for weakness, abnormal sensation, seizure, frequent headaches, memory loss, confusion.  Psych: Negative for anxiety, depression, suicidal ideation, hallucinations.  Endo: Negative for unusual weight change.  Heme: Negative for bruising or bleeding. Allergy: Negative for rash or hives.    Physical Examination:  BP 132/79 mmHg  Pulse 76  Temp(Src) 97.1 F (36.2 C)  Ht 5\' 2"  (1.575 m)  Wt 184 lb 3.2 oz (83.553 kg)  BMI 33.68 kg/m2   General: Well-nourished, well-developed in no acute distress.  Head: Normocephalic, atraumatic.   Eyes: Conjunctiva pink, no icterus. Mouth: Oropharyngeal mucosa moist and pink , no lesions erythema or exudate. Neck: Supple without thyromegaly, masses, or lymphadenopathy.  Lungs: Clear to auscultation bilaterally.  Heart: Regular rate and rhythm, no murmurs rubs or gallops.  Abdomen: Bowel sounds are normal, nontender, nondistended, no hepatosplenomegaly or masses, no abdominal bruits or    hernia , no rebound or  guarding.   Rectal: not performed. Extremities: No lower extremity edema. No clubbing or deformities.  Neuro: Alert and oriented x 4 , grossly normal neurologically.  Skin: Warm and dry, no rash or jaundice.   Psych: Alert and cooperative, normal mood and affect.  Imaging Studies: No results found.

## 2014-10-30 NOTE — Op Note (Signed)
[facility] [address] [place], [address]   COLONOSCOPY PROCEDURE REPORT  PATIENT: [name] [name], [name] [name]  MR#: [id number] BIRTHDATE: 1965/09/16 , 52  yrs. old GENDER: female ENDOSCOPIST: R.  Garfield Cornea, MD FACP FACG REFERRED [id number] Muse, [place] PROCEDURE DATE:  November 09, 2014 PROCEDURE:   Colonoscopy, screening INDICATIONS:Average risk colorectal cancer screening examination. MEDICATIONS: Versed 7 mg IV and Demerol 125 mg IV in divided doses. Zofran 4 mg IV. ASA CLASS:       Class II  CONSENT: The risks, benefits, alternatives and imponderables including but not limited to bleeding, perforation as well as the possibility of a missed lesion have been reviewed.  The potential for biopsy, lesion removal, etc. have also been discussed. Questions have been answered.  All parties agreeable.  Please see the history and physical in the medical record for more information.  DESCRIPTION OF PROCEDURE:   After the risks benefits and alternatives of the procedure were thoroughly explained, informed consent was obtained.  The digital rectal exam revealed no abnormalities of the rectum.   The EG-[id number]  endoscope was introduced through the anus and advanced to the cecum, which was identified by both the appendix and ileocecal valve. No adverse events experienced.   The quality of the prep was adequate  The instrument was then slowly withdrawn as the colon was fully examined. Estimated blood loss is zero unless otherwise noted in this procedure report.      COLON FINDINGS: Multiple anal papilla; otherwise, normal-appearing rectal mucosa.  Normal-appearing colonic mucosa.  Retroflexion was performed. .  Withdrawal time=7 minutes 0 seconds.  The scope was withdrawn and the procedure completed. COMPLICATIONS: There were no immediate complications.  ENDOSCOPIC IMPRESSION: Normal colonoscopy  RECOMMENDATIONS: Repeat examination for screening purposes in 10  years. See EGD report.  eSigned:  R. Garfield Cornea, MD Rosalita Chessman [facility] 11-09-2014 11:40 AM   cc:  CPT CODES: ICD CODES:  The ICD and CPT codes recommended by this software are interpretations from the data that the clinical staff has captured with the software.  The verification of the translation of this report to the ICD and CPT codes and modifiers is the sole responsibility of the health care institution and practicing physician where this report was generated.  [place]. will not be held responsible for the validity of the ICD and CPT codes included on this report.  AMA assumes no liability for data contained or not contained herein. CPT is a Designer, television/film set of the [employer].

## 2014-10-30 NOTE — Op Note (Signed)
[facility] [address] [place], [address]   ENDOSCOPY PROCEDURE REPORT  PATIENT: [name] [name], [name] [name]  MR#: [id number] BIRTHDATE: [birth year] , 49  yrs. old GENDER: female ENDOSCOPIST: R.  Garfield Cornea, MD [facility] REFERRED BY:  Royce Macadamia, [place] PROCEDURE DATE:  11/10/2014 PROCEDURE:  EGD w/ biopsy and Maloney dilation of esophagus INDICATIONS:  GERD; esophageal dysphagia. MEDICATIONS: Versed 6 mg IV and Demerol 125 mg IV in divided Cetacaine spray.  Zofran 4 mg IV. ASA CLASS:      Class II  CONSENT: The risks, benefits, limitations, alternatives and imponderables have been discussed.  The potential for biopsy, esophogeal dilation, etc. have also been reviewed.  Questions have been answered.  All parties agreeable.  Please see the history and physical in the medical record for more information.  DESCRIPTION OF PROCEDURE: After the risks benefits and alternatives of the procedure were thoroughly explained, informed consent was obtained.  The [id number] endoscope was introduced through the mouth and advanced to the second portion of the duodenum , limited by Without limitations. The instrument was slowly withdrawn as the mucosa was fully examined. Estimated blood loss is zero unless otherwise noted in this procedure report.    Normal-appearing, patent tubular esophagus.  Stomach empty.  Focal antral erosions.  No ulcer or infiltrating process.  Patent pylorus.  Normal-appearing first and second portion of the duodenum.  A 54 French Maloney dilator was passed to full insertion.  A look back revealed no apparent complications related to this maneuver. Subsequently, biopsies of the abnormal antrum taken for histologic study.  Retroflexed views revealed no abnormalities.     The scope was then withdrawn from the patient and the procedure completed.  COMPLICATIONS: There were no immediate complications.  ENDOSCOPIC IMPRESSION: Normal esophagus  status post Maloney dilation. Gastric erosions of uncertain significance?"status post biopsy  RECOMMENDATIONS: Follow-up on pathology. See colonoscopy report.  REPEAT EXAM:  eSigned:  R. Garfield Cornea, MD Rosalita Chessman [facility] 10-Nov-2014 11:22 AM    CC:  CPT CODES: ICD CODES:  The ICD and CPT codes recommended by this software are interpretations from the data that the clinical staff has captured with the software.  The verification of the translation of this report to the ICD and CPT codes and modifiers is the sole responsibility of the health care institution and practicing physician where this report was generated.  [place]. will not be held responsible for the validity of the ICD and CPT codes included on this report.  AMA assumes no liability for data contained or not contained herein. CPT is a Designer, television/film set of the [employer].

## 2014-10-30 NOTE — Interval H&P Note (Signed)
History and Physical Interval Note:  10/30/2014 10:55 AM  [name] [name]  has presented today for surgery, with the diagnosis of GERD/ESOPH DYSPHAGIA/CHROIC CONSTPATION  The various methods of treatment have been discussed with the patient and family. After consideration of risks, benefits and other options for treatment, the patient has consented to  Procedure(s) with comments: COLONOSCOPY (N/A) - 1045-moved to 1100 Office to notify pt ESOPHAGOGASTRODUODENOSCOPY (EGD) (N/A) ESOPHAGEAL DILATION (N/A) as a surgical intervention .  The patient's history has been reviewed, patient examined, no change in status, stable for surgery.  I have reviewed the patient's chart and labs.  Questions were answered to the patient's satisfaction.     Robert Rourk  No change. EGD with possible esophageal dilation as feasible/appropriate and screening colonoscopy per plan.  The risks, benefits, limitations, imponderables and alternatives regarding both EGD and colonoscopy have been reviewed with the patient. Questions have been answered. All parties agreeable.

## 2014-10-31 ENCOUNTER — Encounter (HOSPITAL_COMMUNITY): Payer: Self-pay | Admitting: Internal Medicine

## 2014-10-31 ENCOUNTER — Encounter: Payer: Self-pay | Admitting: Gastroenterology

## 2014-11-02 ENCOUNTER — Encounter: Payer: Self-pay | Admitting: Internal Medicine

## 2014-11-04 ENCOUNTER — Telehealth: Payer: Self-pay

## 2014-11-04 NOTE — Telephone Encounter (Signed)
Letter mailed to the pt. 

## 2014-11-04 NOTE — Telephone Encounter (Signed)
Reminder in epic °

## 2014-11-04 NOTE — Telephone Encounter (Signed)
Per RMR- Send letter to patient.  Send copy of letter with path to referring provider and PCP.   Would offer follow-up appointment in 3 months to see how she is doing

## 2014-11-05 ENCOUNTER — Telehealth: Payer: Self-pay

## 2014-11-05 MED ORDER — ONDANSETRON HCL 4 MG PO TABS: ORAL_TABLET | ORAL | Status: DC

## 2014-11-05 NOTE — Telephone Encounter (Signed)
Pt notified via mychart

## 2014-11-05 NOTE — Telephone Encounter (Signed)
Pt called- she is having a lot of nausea after eating. She wants to know if we can send in some nausea medication?

## 2014-11-05 NOTE — Telephone Encounter (Signed)
We can try Zofran 4 mg tablets. Dispense 30. Take one orally every 6 hours as needed for nausea.  No refills.

## 2014-11-05 NOTE — Telephone Encounter (Signed)
rx sent to the pharmacy. 

## 2014-11-10 ENCOUNTER — Encounter: Payer: Self-pay | Admitting: Gastroenterology

## 2014-11-11 ENCOUNTER — Other Ambulatory Visit: Payer: Self-pay | Admitting: Gastroenterology

## 2014-11-11 ENCOUNTER — Encounter: Payer: Self-pay | Admitting: Gastroenterology

## 2014-11-11 MED ORDER — ESOMEPRAZOLE MAGNESIUM 40 MG PO PACK: 40.0000 mg | PACK | Freq: Every day | ORAL | Status: DC

## 2014-11-14 ENCOUNTER — Telehealth: Payer: Self-pay | Admitting: Gastroenterology

## 2014-11-14 NOTE — Telephone Encounter (Signed)
Samples and pt assistance forms are at the front desk.

## 2014-11-14 NOTE — Telephone Encounter (Signed)
[name] [name], I sent email to patient to start PPI. She has no insurance so can we put together 30 days of Dexilant and patient assistance forms?

## 2014-11-18 ENCOUNTER — Other Ambulatory Visit: Payer: Self-pay | Admitting: Gastroenterology

## 2014-11-18 MED ORDER — PROMETHAZINE HCL 25 MG PO TABS: 25.0000 mg | ORAL_TABLET | Freq: Four times a day (QID) | ORAL | Status: DC | PRN

## 2014-12-02 ENCOUNTER — Emergency Department (HOSPITAL_COMMUNITY)
Admission: EM | Admit: 2014-12-02 | Discharge: 2014-12-02 | Disposition: A | Discharge status: Home / Self Care | Payer: Self-pay | Attending: Physician Assistant | Admitting: Physician Assistant

## 2014-12-02 ENCOUNTER — Encounter (HOSPITAL_COMMUNITY): Payer: Self-pay | Admitting: Emergency Medicine

## 2014-12-02 DIAGNOSIS — Z8639 Personal history of other endocrine, nutritional and metabolic disease: Secondary | ICD-10-CM | POA: Insufficient documentation

## 2014-12-02 DIAGNOSIS — B349 Viral infection, unspecified: Secondary | ICD-10-CM | POA: Insufficient documentation

## 2014-12-02 DIAGNOSIS — Z8719 Personal history of other diseases of the digestive system: Secondary | ICD-10-CM | POA: Insufficient documentation

## 2014-12-02 DIAGNOSIS — Z79899 Other long term (current) drug therapy: Secondary | ICD-10-CM | POA: Insufficient documentation

## 2014-12-02 LAB — CBC WITH DIFFERENTIAL/PLATELET
BASOS PCT: 1 % (ref 0–1)
Basophils Absolute: 0 10*3/uL (ref 0.0–0.1)
EOS ABS: 0.4 10*3/uL (ref 0.0–0.7)
EOS PCT: 8 % — AB (ref 0–5)
HEMATOCRIT: 37.1 % (ref 36.0–46.0)
HEMOGLOBIN: 11.5 g/dL — AB (ref 12.0–15.0)
Lymphocytes Relative: 47 % — ABNORMAL HIGH (ref 12–46)
Lymphs Abs: 2.1 10*3/uL (ref 0.7–4.0)
MCH: 27.4 pg (ref 26.0–34.0)
MCHC: 31 g/dL (ref 30.0–36.0)
MCV: 88.3 fL (ref 78.0–100.0)
MONOS PCT: 7 % (ref 3–12)
Monocytes Absolute: 0.3 10*3/uL (ref 0.1–1.0)
NEUTROS PCT: 37 % — AB (ref 43–77)
Neutro Abs: 1.6 10*3/uL — ABNORMAL LOW (ref 1.7–7.7)
PLATELETS: 348 10*3/uL (ref 150–400)
RBC: 4.2 MIL/uL (ref 3.87–5.11)
RDW: 13.5 % (ref 11.5–15.5)
WBC: 4.4 10*3/uL (ref 4.0–10.5)

## 2014-12-02 LAB — BASIC METABOLIC PANEL
Anion gap: 7 (ref 5–15)
BUN: 19 mg/dL (ref 6–20)
CALCIUM: 9.3 mg/dL (ref 8.9–10.3)
CHLORIDE: 105 mmol/L (ref 101–111)
CO2: 28 mmol/L (ref 22–32)
Creatinine, Ser: 1.24 mg/dL — ABNORMAL HIGH (ref 0.44–1.00)
GFR calc non Af Amer: 50 mL/min — ABNORMAL LOW (ref >60)
GFR, EST AFRICAN AMERICAN: 58 mL/min — AB (ref >60)
Glucose, Bld: 77 mg/dL (ref 65–99)
Potassium: 3.8 mmol/L (ref 3.5–5.1)
Sodium: 140 mmol/L (ref 135–145)

## 2014-12-02 LAB — URINALYSIS, ROUTINE W REFLEX MICROSCOPIC
Bilirubin Urine: NEGATIVE
GLUCOSE, UA: NEGATIVE mg/dL
Hgb urine dipstick: NEGATIVE
Ketones, ur: NEGATIVE mg/dL
LEUKOCYTES UA: NEGATIVE
Nitrite: NEGATIVE
Protein, ur: NEGATIVE mg/dL
Specific Gravity, Urine: 1.015 (ref 1.005–1.030)
Urobilinogen, UA: 0.2 mg/dL (ref 0.0–1.0)
pH: 6 (ref 5.0–8.0)

## 2014-12-02 LAB — MONONUCLEOSIS SCREEN: MONO SCREEN: NEGATIVE

## 2014-12-02 MED ORDER — IBUPROFEN 400 MG PO TABS
600.0000 mg | ORAL_TABLET | Freq: Once | ORAL | Status: AC
  Administered 2014-12-02: 600 mg via ORAL
  Filled 2014-12-02: qty 2

## 2014-12-02 NOTE — ED Notes (Signed)
Pt states that she started with body aches on Friday. Denies any other problems

## 2014-12-02 NOTE — ED Provider Notes (Signed)
CSN: [id number]     Arrival date & time 12/02/14  1104 History  This chart was scribed for non-physician practitioner, Debroah Baller, NP, working with Macarthur Critchley, MD, by Stephania Fragmin, ED Scribe. This patient was seen in room APFT23/APFT23 and the patient's care was started at 11:45 AM.    Chief Complaint  Patient presents with  . Generalized Body Aches   Patient is a 49 y.o. female presenting with musculoskeletal pain. The history is provided by the patient. No language interpreter was used.  Muscle Pain This is a new problem. The current episode started more than 2 days ago. The problem occurs constantly. The problem has been gradually worsening. Associated symptoms include abdominal pain and headaches. Pertinent negatives include no chest pain and no shortness of breath. Nothing aggravates the symptoms. Nothing relieves the symptoms. She has tried nothing for the symptoms.    HPI Comments: [name] [name] is a 49 y.o. female who presents to the Emergency Department complaining of generalized myalgias, with the worst pain in her back, that began 3 days ago. She also complains of an associated sore throat and left otalgia. She endorses "a little" lower abdominal pain, a frontal headache, minimal swelling in her hands, and tiredness. She denies a history of similar symptoms. She denies urinary frequency, dysuria, chills, cough, congestion, nausea, vomiting, diarrhea, vaginal bleeding, vaginal discharge, or leg swelling.   Past Medical History  Diagnosis Date  . Nondiabetic gastroparesis   . Grave's disease     diagnosed years ago/ now hypothyroidism  . IBS (irritable colon syndrome)     constipation predominant   Past Surgical History  Procedure Laterality Date  . Cholecystectomy    . Tonsillectomy    . Tubal ligation    . S/p hysterectomy     . Esophagogastroduodenoscopy  01/2004    Dr. Almyra Free small polyps in postbulbar area ablated  . Attempted colonoscopy  03/2004     Dr. Rourk--> prep was poor. Exam to 40 cm. No gross abnormalities. Internal hemorrhoids. Air contrast barium enema was normal.  . Abdominal hysterectomy    . Colonoscopy N/A 10/30/2014    Procedure: COLONOSCOPY;  Surgeon: Daneil Dolin, MD;  Location: [facility];  Service: Endoscopy;  Laterality: N/A;  1045-moved to 1100 Office to notify pt  . Esophagogastroduodenoscopy N/A 10/30/2014    Procedure: ESOPHAGOGASTRODUODENOSCOPY (EGD);  Surgeon: Daneil Dolin, MD;  Location: [facility];  Service: Endoscopy;  Laterality: N/A;  . Esophageal dilation N/A 10/30/2014    Procedure: ESOPHAGEAL DILATION;  Surgeon: Daneil Dolin, MD;  Location: [facility];  Service: Endoscopy;  Laterality: N/A;   Family History  Problem Relation Age of Onset  . Colon cancer Neg Hx   . Other Father 16    hit by train  . Heart disease Mother   . Kidney disease Mother    History  Substance Use Topics  . Smoking status: Never Smoker   . Smokeless tobacco: Not on file  . Alcohol Use: No     Comment: occ   OB History    Gravida Para Term Preterm AB TAB SAB Ectopic Multiple Living   1 1 1             Review of Systems  Constitutional: Positive for fatigue. Negative for chills.  HENT: Positive for ear pain and sore throat. Negative for congestion.   Respiratory: Negative for cough and shortness of breath.   Cardiovascular: Negative for chest pain and leg swelling.  Gastrointestinal: Positive for abdominal pain. Negative for nausea and vomiting.  Genitourinary: Negative for frequency, vaginal bleeding and vaginal discharge.  Musculoskeletal: Positive for myalgias and back pain.  Neurological: Positive for headaches.  All other systems reviewed and are negative.  Allergies  Codeine  Home Medications   Prior to Admission medications   Medication Sig Start Date End Date Taking? Authorizing Provider  acetaminophen (TYLENOL) 500 MG tablet Take 500 mg by mouth daily as needed for mild pain or moderate pain.    Yes Historical Provider, MD  levothyroxine (SYNTHROID, LEVOTHROID) 88 MCG tablet Take 88 mcg by mouth daily before breakfast.   Yes Historical Provider, MD  polyethylene glycol (MIRALAX / GLYCOLAX) packet Take 17 g by mouth daily.   Yes Historical Provider, MD  sodium chloride (OCEAN) 0.65 % SOLN nasal spray Place 1 spray into both nostrils as needed for congestion.   Yes Historical Provider, MD  cephALEXin (KEFLEX) 500 MG capsule Take 1 capsule (500 mg total) by mouth 4 (four) times daily. For 7 days Patient not taking: Reported on 10/21/2014 10/11/14   Tammy Triplett, PA-C  esomeprazole (NEXIUM) 40 MG packet Take 40 mg by mouth daily before breakfast. Patient not taking: Reported on 12/02/2014 11/11/14   Mahala Menghini, PA-C  Linaclotide [facility]) 145 MCG CAPS capsule Take 1 capsule (145 mcg total) by mouth daily. Patient not taking: Reported on 10/21/2014 08/08/14   Mahala Menghini, PA-C  ondansetron (ZOFRAN) 4 MG tablet Take 1 tablet (4 mg total) by mouth every 8 (eight) hours as needed for nausea or vomiting. Patient not taking: Reported on 10/21/2014 11/01/13   Orvil Feil, NP  ondansetron [facility]) 4 MG tablet Take one every 6 hours as needed for nausea Patient not taking: Reported on 12/02/2014 11/05/14   Daneil Dolin, MD  promethazine (PHENERGAN) 25 MG tablet Take 1 tablet (25 mg total) by mouth every 6 (six) hours as needed for nausea or vomiting. Patient not taking: Reported on 12/02/2014 11/18/14   Mahala Menghini, PA-C   BP 143/75 mmHg  Pulse 61  Temp(Src) 98.2 F (36.8 C) (Oral)  Resp 18  Ht 5\' 2"  (1.575 m)  Wt 183 lb (83.008 kg)  BMI 33.46 kg/m2  SpO2 99% Physical Exam  Constitutional: She is oriented to person, place, and time. She appears well-developed and well-nourished. No distress.  HENT:  Head: Normocephalic and atraumatic.  Right Ear: Tympanic membrane normal.  Left Ear: Tympanic membrane normal.  Mouth/Throat: Uvula is midline. No posterior oropharyngeal edema or posterior  oropharyngeal erythema.  Eyes: Conjunctivae and EOM are normal. Pupils are equal, round, and reactive to light.  Neck: Normal range of motion. Neck supple. No tracheal deviation present.  Cardiovascular: Normal rate, regular rhythm and normal heart sounds.   Pulmonary/Chest: Effort normal. No respiratory distress.  Abdominal: Soft. Bowel sounds are normal. There is no CVA tenderness.  Minimal tenderness in the lower abdomen. Normal bowel sounds, abdomen soft.  Musculoskeletal: Normal range of motion.  Lymphadenopathy:    She has no cervical adenopathy.  Neurological: She is alert and oriented to person, place, and time.  Skin: Skin is warm and dry.  Psychiatric: She has a normal mood and affect. Her behavior is normal.  Nursing note and vitals reviewed.   ED Course  Procedures (including critical care time)  DIAGNOSTIC STUDIES: Oxygen Saturation is 100% on RA, normal by my interpretation.    COORDINATION OF CARE: 12:02 PM - Discussed treatment plan with pt at bedside which  includes blood tests to check WBC count, mono screen, and UA, and pt agreed to plan.   Labs Review Results for orders placed or performed during the hospital encounter of 12/02/14 (from the past 24 hour(s))  CBC with Differential     Status: Abnormal   Collection Time: 12/02/14 12:18 PM  Result Value Ref Range   WBC 4.4 4.0 - 10.5 K/uL   RBC 4.20 3.87 - 5.11 MIL/uL   Hemoglobin 11.5 (L) 12.0 - 15.0 g/dL   HCT 37.1 36.0 - 46.0 %   MCV 88.3 78.0 - 100.0 fL   MCH 27.4 26.0 - 34.0 pg   MCHC 31.0 30.0 - 36.0 g/dL   RDW 13.5 11.5 - 15.5 %   Platelets 348 150 - 400 K/uL   Neutrophils Relative % 37 (L) 43 - 77 %   Neutro Abs 1.6 (L) 1.7 - 7.7 K/uL   Lymphocytes Relative 47 (H) 12 - 46 %   Lymphs Abs 2.1 0.7 - 4.0 K/uL   Monocytes Relative 7 3 - 12 %   Monocytes Absolute 0.3 0.1 - 1.0 K/uL   Eosinophils Relative 8 (H) 0 - 5 %   Eosinophils Absolute 0.4 0.0 - 0.7 K/uL   Basophils Relative 1 0 - 1 %   Basophils  Absolute 0.0 0.0 - 0.1 K/uL  Basic metabolic panel     Status: Abnormal   Collection Time: 12/02/14 12:18 PM  Result Value Ref Range   Sodium 140 135 - 145 mmol/L   Potassium 3.8 3.5 - 5.1 mmol/L   Chloride 105 101 - 111 mmol/L   CO2 28 22 - 32 mmol/L   Glucose, Bld 77 65 - 99 mg/dL   BUN 19 6 - 20 mg/dL   Creatinine, Ser 1.24 (H) 0.44 - 1.00 mg/dL   Calcium 9.3 8.9 - 10.3 mg/dL   GFR calc non Af Amer 50 (L) >60 mL/min   GFR calc Af Amer 58 (L) >60 mL/min   Anion gap 7 5 - 15  Mononucleosis screen     Status: None   Collection Time: 12/02/14 12:18 PM  Result Value Ref Range   Mono Screen NEGATIVE NEGATIVE  Urinalysis, Routine w reflex microscopic (not at [facility])     Status: None   Collection Time: 12/02/14 12:24 PM  Result Value Ref Range   Color, Urine YELLOW YELLOW   APPearance CLEAR CLEAR   Specific Gravity, Urine 1.015 1.005 - 1.030   pH 6.0 5.0 - 8.0   Glucose, UA NEGATIVE NEGATIVE mg/dL   Hgb urine dipstick NEGATIVE NEGATIVE   Bilirubin Urine NEGATIVE NEGATIVE   Ketones, ur NEGATIVE NEGATIVE mg/dL   Protein, ur NEGATIVE NEGATIVE mg/dL   Urobilinogen, UA 0.2 0.0 - 1.0 mg/dL   Nitrite NEGATIVE NEGATIVE   Leukocytes, UA NEGATIVE NEGATIVE    Imaging Review No results found.  MDM  49 y.o. female with generalized body aches for the past 3 days. Stable for d/c without respiratory symptoms and O2 SAT 99% on R/A. Will treat as viral illness and she will return for worsening symptoms. Discussed with the patient clinical and lab findings and plan of care. She voices understanding and agrees with plan. She will rest, drink plenty of fluids and take tylenol and ibuprofen for her body aches.   Final diagnoses:  Viral illness    I personally performed the services described in this documentation, which was scribed in my presence. The recorded information has been reviewed and is accurate.  [place], NP 12/02/14 [place], MD 12/03/14 [phone]

## 2014-12-02 NOTE — Discharge Instructions (Signed)
Your blood work and urine today are normal. You most likely have a viral illness. Take tylenol and ibuprofen, rest, drink plenty of fluids and follow up with DR.Muse or return here for worsening symptoms.

## 2015-01-13 ENCOUNTER — Encounter: Payer: Self-pay | Admitting: Internal Medicine

## 2015-02-17 ENCOUNTER — Ambulatory Visit (INDEPENDENT_AMBULATORY_CARE_PROVIDER_SITE_OTHER): Payer: Self-pay | Admitting: Gastroenterology

## 2015-02-17 ENCOUNTER — Encounter: Payer: Self-pay | Admitting: Gastroenterology

## 2015-02-17 VITALS — BP 152/82 | HR 83 | Temp 97.7°F | Ht 62.0 in | Wt 186.6 lb

## 2015-02-17 DIAGNOSIS — E032 Hypothyroidism due to medicaments and other exogenous substances: Secondary | ICD-10-CM

## 2015-02-17 DIAGNOSIS — K219 Gastro-esophageal reflux disease without esophagitis: Secondary | ICD-10-CM

## 2015-02-17 DIAGNOSIS — K5909 Other constipation: Secondary | ICD-10-CM

## 2015-02-17 DIAGNOSIS — K3184 Gastroparesis: Secondary | ICD-10-CM

## 2015-02-17 MED ORDER — PROMETHAZINE HCL 25 MG PO TABS: ORAL_TABLET | ORAL | Status: DC

## 2015-02-17 NOTE — Progress Notes (Signed)
CC'D TO PCP °

## 2015-02-17 NOTE — Patient Instructions (Addendum)
Trial of Dexilant 60mg  daily, 30 minutes before breakfast daily. Take for 20 days, if no improvement in nausea or lump in the throat then stop.  Recommend you follow up with an endocrinologist about your thyroid. If you are approved for cone assistance, then you may be able to see Dr. Dorris Fetch. Call and let me know.  Gastroparesis diet. See handout. This should help control your nausea.   Return to the office in six months.   Gastroparesis Gastroparesis, also called delayed gastric emptying, is a condition in which food takes longer than normal to empty from the stomach. The condition is usually long-lasting (chronic). CAUSES This condition may be caused by:  An endocrine disorder, such as hypothyroidism or diabetes. Diabetes is the most common cause of this condition.  A nervous system disease, such as Parkinson disease or multiple sclerosis.  Cancer, infection, or surgery of the stomach or vagus nerve.  A connective tissue disorder, such as scleroderma.  Certain medicines. In most cases, the cause is not known. RISK FACTORS This condition is more likely to develop in:  People with certain disorders, including endocrine disorders, eating disorders, amyloidosis, and scleroderma.  People with certain diseases, including Parkinson disease or multiple sclerosis.  People with cancer or infection of the stomach or vagus nerve.  People who have had surgery on the stomach or vagus nerve.  People who take certain medicines.  Women. SYMPTOMS Symptoms of this condition include:  An early feeling of fullness when eating.  Nausea.  Weight loss.  Vomiting.  Heartburn.  Abdominal bloating.  Inconsistent blood glucose levels.  Lack of appetite.  Acid from the stomach coming up into the esophagus (gastroesophageal reflux).  Spasms of the stomach. Symptoms may come and go. DIAGNOSIS This condition is diagnosed with tests, such as:  Tests that check how long it takes food  to move through the stomach and intestines. These tests include:  Upper gastrointestinal (GI) series. In this test, X-rays of the intestines are taken after you drink a liquid. The liquid makes the intestines show up better on the X-rays.  Gastric emptying scintigraphy. In this test, scans are taken after you eat food that contains a small amount of radioactive material.  Wireless capsule GI monitoring system. This test involves swallowing a capsule that records information about movement through the stomach.  Gastric manometry. This test measures electrical and muscular activity in the stomach. It is done with a thin tube that is passed down the throat and into the stomach.  Endoscopy. This test checks for abnormalities in the lining of the stomach. It is done with a long, thin tube that is passed down the throat and into the stomach.  An ultrasound. This test can help rule out gallbladder disease or pancreatitis as a cause of your symptoms. It uses sound waves to take pictures of the inside of your body. TREATMENT There is no cure for gastroparesis. This condition may be managed with:  Treatment of the underlying condition causing the gastroparesis.  Lifestyle changes, including exercise and dietary changes. Dietary changes can include:  Changes in what and when you eat.  Eating smaller meals more often.  Eating low-fat foods.  Eating low-fiber forms of high-fiber foods, such as cooked vegetables instead of raw vegetables.  Having liquid foods in place of solid foods. Liquid foods are easier to digest.  Medicines. These may be given to control nausea and vomiting and to stimulate stomach muscles.  Getting food through a feeding tube. This may  be done in severe cases.  A gastric neurostimulator. This is a device that is inserted into the body with surgery. It helps improve stomach emptying and control nausea and vomiting. HOME CARE INSTRUCTIONS  Follow your health care  provider's instructions about exercise and diet.  Take medicines only as directed by your health care provider. SEEK MEDICAL CARE IF:  Your symptoms do not improve with treatment.  You have new symptoms. SEEK IMMEDIATE MEDICAL CARE IF:  You have severe abdominal pain that does not improve with treatment.  You have nausea that does not go away.  You cannot keep fluids down.   This information is not intended to replace advice given to you by your health care provider. Make sure you discuss any questions you have with your health care provider.   Document Released: 04/12/2005 Document Revised: 08/27/2014 Document Reviewed: 04/08/2014 Elsevier Interactive Patient Education [employer].

## 2015-02-17 NOTE — Assessment & Plan Note (Signed)
Continue miralax prn

## 2015-02-17 NOTE — Assessment & Plan Note (Signed)
Daily nausea postprandially but improves with sucking on hard candy. No weight loss. No vomiting. Discussed reglan but at this time I do not feel benefits outweight the risk of permanent side effects, ie tardive dyskinesia.   Recommend gastroparesis diet. Recommend trial of Dexilant once daily for 20 days. Phenergan prn, warned of risk of sedation. Patient has no insurance and cannot afford Zofran.  Return to office in six months or call sooner if needed.

## 2015-02-17 NOTE — Assessment & Plan Note (Signed)
?  globus related to atypical GERD vs thyroid issues. Trial of Dexilant as outlined. Discussed antireflux measures.

## 2015-02-17 NOTE — Progress Notes (Signed)
Primary Care Physician: Raiford Simmonds., PA-C  Primary Gastroenterologist:  Garfield Cornea, MD   Chief Complaint  Patient presents with  . Follow-up    HPI: [name] [name] is a 49 y.o. female here for follow up. H/O constipation, idiopathic gastroparesis, GERD.   EGD July 2016 for refractory GERD and esophageal dysphagia showed focal antral erosions, normal appearing esophagus status post empiric dilation, biopsies benign. Colonoscopy at the same time for screening purposes was unremarkable. Next colonoscopy planned for July 2026.  Weight is up 20 pounds in the last one year. Hair loss. Hasn't seen endocrinologist is a long time. Last thyroid u/s 06/2012 with ?subcentimeter solid nodule lower pole consider follow up ultrasound in one year.   Continues to have globus sensation. No better s/p empirical esophageal dilation. Denies heartburn. Not on PPI. No nocturnal symptoms. Wakes up without nausea but notes after first meal of day. Typically snacks during day and eats supper. Sucking on hard candy helps nausea. Cannot afford zofran. No vomiting. BM regular. Using miralax prn. No bleeding or melena.   Did not try dexilant.    Current Outpatient Prescriptions  Medication Sig Dispense Refill  . acetaminophen (TYLENOL) 500 MG tablet Take 500 mg by mouth daily as needed for mild pain or moderate pain.    Marland Kitchen levothyroxine (SYNTHROID, LEVOTHROID) 88 MCG tablet Take 88 mcg by mouth daily before breakfast.    . polyethylene glycol (MIRALAX / GLYCOLAX) packet Take 17 g by mouth daily.    . sodium chloride (OCEAN) 0.65 % SOLN nasal spray Place 1 spray into both nostrils as needed for congestion.     No current facility-administered medications for this visit.    Allergies as of 02/17/2015 - Review Complete 02/17/2015  Allergen Reaction Noted  . Codeine Nausea And Vomiting     ROS:  General: Negative for anorexia, weight loss, fever, chills/ +fatigue ENT: Negative for  hoarseness,  nasal congestion.see hpi CV: Negative for chest pain, angina, palpitations, dyspnea on exertion, peripheral edema.  Respiratory: Negative for dyspnea at rest, dyspnea on exertion, cough, sputum, wheezing.  GI: See history of present illness. GU:  Negative for dysuria, hematuria, urinary incontinence, urinary frequency, nocturnal urination.  Endo: Negative for unusual weight change.    Physical Examination:   BP 152/82 mmHg  Pulse 83  Temp(Src) 97.7 F (36.5 C)  Ht 5\' 2"  (1.575 m)  Wt 186 lb 9.6 oz (84.641 kg)  BMI 34.12 kg/m2  General: Well-nourished, well-developed in no acute distress.  Eyes: No icterus. Mouth: Oropharyngeal mucosa moist and pink , no lesions erythema or exudate. Lungs: Clear to auscultation bilaterally.  Heart: Regular rate and rhythm, no murmurs rubs or gallops.  Abdomen: Bowel sounds are normal, nontender, nondistended, no hepatosplenomegaly or masses, no abdominal bruits or hernia , no rebound or guarding.   Extremities: No lower extremity edema. No clubbing or deformities. Neuro: Alert and oriented x 4   Skin: Warm and dry, no jaundice.   Psych: Alert and cooperative, normal mood and affect.  Labs:  Labs by outside provider: Lab Results  Component Value Date   WBC 4.4 12/02/2014   HGB 11.5* 12/02/2014   HCT 37.1 12/02/2014   MCV 88.3 12/02/2014   PLT 348 12/02/2014   Lab Results  Component Value Date   CREATININE 1.24* 12/02/2014   BUN 19 12/02/2014   NA 140 12/02/2014   K 3.8 12/02/2014   CL 105 12/02/2014   CO2 28 12/02/2014    Imaging  Studies: No results found.

## 2015-02-17 NOTE — Assessment & Plan Note (Signed)
Patient reports having tsh checked at health department. Reviewed Epic. She had thyroid ultrasound in March 2014 that showed increased size of her thyroid, questionable subcentimeter solid nodule lower pole which needed a 12 month follow-up.  Patient would benefit from following up with endocrinologist. She has multiple symptoms which are likely thyroid related.needs to have follow-up soft tissue ultrasound of the neck. Will discuss further with patient. We can order this or she can have done by the health department. We will proceed per patient recommendations.

## 2015-02-20 ENCOUNTER — Encounter: Payer: Self-pay | Admitting: Gastroenterology

## 2015-02-23 ENCOUNTER — Encounter: Payer: Self-pay | Admitting: Gastroenterology

## 2015-02-27 ENCOUNTER — Other Ambulatory Visit: Payer: Self-pay

## 2015-02-27 ENCOUNTER — Telehealth: Payer: Self-pay | Admitting: Gastroenterology

## 2015-02-27 DIAGNOSIS — E041 Nontoxic single thyroid nodule: Secondary | ICD-10-CM

## 2015-02-27 NOTE — Telephone Encounter (Signed)
Please schedule ultrasound soft tissue head/neck to follow-up thyroid nodule. Please make referral to Dr. Dorris Fetch, thyroid nodule/thyroid symptoms

## 2015-02-27 NOTE — Telephone Encounter (Signed)
Called pt. LMOM with appt information.  [place] is set for 03/05/2015 @ 1:15pm.  Referral sent to Dr. Dorris Fetch on [facility]

## 2015-02-27 NOTE — Telephone Encounter (Signed)
Please schedule

## 2015-03-05 ENCOUNTER — Ambulatory Visit (HOSPITAL_COMMUNITY)
Admission: RE | Admit: 2015-03-05 | Discharge: 2015-03-05 | Disposition: A | Discharge status: Home / Self Care | Payer: Self-pay | Source: Ambulatory Visit | Attending: Gastroenterology | Admitting: Gastroenterology

## 2015-03-05 DIAGNOSIS — E041 Nontoxic single thyroid nodule: Secondary | ICD-10-CM | POA: Insufficient documentation

## 2015-03-06 NOTE — Progress Notes (Signed)
Quick Note:  U/s of thyroids with stable 45mm nodule left lobe. l Recommend she see Dr. Dorris Fetch for follow up thyroid nodule, thyroid disease as next step. Unclear if this is contributing to her globus sensation. ______

## 2015-03-10 ENCOUNTER — Other Ambulatory Visit: Payer: Self-pay

## 2015-03-10 DIAGNOSIS — E041 Nontoxic single thyroid nodule: Secondary | ICD-10-CM

## 2015-04-03 ENCOUNTER — Encounter: Payer: Self-pay | Admitting: Gastroenterology

## 2015-05-05 ENCOUNTER — Encounter: Payer: Self-pay | Admitting: Gastroenterology

## 2015-05-27 ENCOUNTER — Ambulatory Visit: Payer: Self-pay | Admitting: "Endocrinology

## 2015-06-11 ENCOUNTER — Telehealth: Payer: Self-pay | Admitting: Internal Medicine

## 2015-06-11 NOTE — Telephone Encounter (Signed)
Pt called to say that she received an email from [place] and can't open it. I told her all I could see was the last mychart message from Jan 9th but she said this was from today. I do not see anything. Are you aware of any messages going to this patient? Please call [phone]

## 2015-06-11 NOTE — Telephone Encounter (Signed)
What was sent to her today was from Dr.Nida's office. Not [place]. Please tell her to call them

## 2015-06-11 NOTE — Telephone Encounter (Signed)
LMOM

## 2015-06-18 ENCOUNTER — Encounter: Payer: Self-pay | Admitting: "Endocrinology

## 2015-06-18 ENCOUNTER — Ambulatory Visit (INDEPENDENT_AMBULATORY_CARE_PROVIDER_SITE_OTHER): Payer: Self-pay | Admitting: "Endocrinology

## 2015-06-18 VITALS — BP 144/85 | HR 84 | Ht 62.0 in | Wt 190.0 lb

## 2015-06-18 DIAGNOSIS — E032 Hypothyroidism due to medicaments and other exogenous substances: Secondary | ICD-10-CM

## 2015-06-18 NOTE — Progress Notes (Signed)
Subjective:    Patient ID: [name] [name], female    DOB: January 04, 1966, PCP MUSE,ROCHELLE D., PA-C   Past Medical History  Diagnosis Date  . Nondiabetic gastroparesis   . Grave's disease     diagnosed years ago/ now hypothyroidism  . IBS (irritable colon syndrome)     constipation predominant   Past Surgical History  Procedure Laterality Date  . Cholecystectomy    . Tonsillectomy    . Tubal ligation    . S/p hysterectomy     . Esophagogastroduodenoscopy  01/2004    Dr. Almyra Free small polyps in postbulbar area ablated  . Attempted colonoscopy  03/2004    Dr. Rourk--> prep was poor. Exam to 40 cm. No gross abnormalities. Internal hemorrhoids. Air contrast barium enema was normal.  . Abdominal hysterectomy    . Colonoscopy N/A 10/30/2014    RMR: normal  . Esophagogastroduodenoscopy N/A 10/30/2014    [id number]  . Esophageal dilation N/A 10/30/2014    Procedure: ESOPHAGEAL DILATION;  Surgeon: Daneil Dolin, MD;  Location: [facility];  Service: Endoscopy;  Laterality: N/A;   Social History   Social History  . Marital Status: Single    Spouse Name: N/A  . Number of Children: 1  . Years of Education: N/A   Occupational History  . unemployed since July 3    Social History Main Topics  . Smoking status: Never Smoker   . Smokeless tobacco: None  . Alcohol Use: No     Comment: occ  . Drug Use: No  . Sexual Activity: Yes    Birth Control/ Protection: Surgical   Other Topics Concern  . None   Social History Narrative   1 grown healthy daughter   Outpatient Encounter Prescriptions as of 06/18/2015  Medication Sig  . levothyroxine (SYNTHROID, LEVOTHROID) 88 MCG tablet Take 88 mcg by mouth daily before breakfast.  . polyethylene glycol (MIRALAX / GLYCOLAX) packet Take 17 g by mouth daily.  Marland Kitchen acetaminophen (TYLENOL) 500 MG tablet Take 500 mg by mouth daily as needed for mild pain or moderate pain.  . promethazine (PHENERGAN) 25 MG tablet Take 1/2 to 1 tablet  every six hour as needed for nausea. May cause drowsiness.  . sodium chloride (OCEAN) 0.65 % SOLN nasal spray Place 1 spray into both nostrils as needed for congestion.   No facility-administered encounter medications on file as of 06/18/2015.   ALLERGIES: Allergies  Allergen Reactions  . Codeine Nausea And Vomiting   VACCINATION STATUS: Immunization History  Administered Date(s) Administered  . Tdap 11/18/2011    HPI 50 year old female with medical history of thyroid toxicosis treated with radioactive iron therapy in late 90s. She is being seen in consultation for hypothyroidism induced by RAI requested by Royce Macadamia, PA-C. She is on levothyroxine 88 g by mouth every morning. She reports compliance. She has family history of thyroid dysfunction in her mother and in her brother. She denies cold intolerance nor heat intolerance. She denies swelling in her neck. She did not have recent thyroid function tests.  Review of Systems  Constitutional: no weight gain/loss, no fatigue, no subjective hyperthermia/hypothermia Eyes: no blurry vision, no xerophthalmia ENT: no sore throat, no nodules palpated in throat, no dysphagia/odynophagia, no hoarseness Cardiovascular: no CP/SOB/palpitations/leg swelling Respiratory: no cough/SOB Gastrointestinal: no N/V/D/C Musculoskeletal: no muscle/joint aches Skin: no rashes Neurological: no tremors/numbness/tingling/dizziness Psychiatric: no depression/anxiety  Objective:    BP 144/85 mmHg  Pulse 84  Ht 5\' 2"  (1.575 m)  Wt 190  lb (86.183 kg)  BMI 34.74 kg/m2  SpO2 96%  Wt Readings from Last 3 Encounters:  06/18/15 190 lb (86.183 kg)  02/17/15 186 lb 9.6 oz (84.641 kg)  12/02/14 183 lb (83.008 kg)    Physical Exam Constitutional: overweight, in NAD Eyes: PERRLA, EOMI, no exophthalmos ENT: moist mucous membranes, no thyromegaly, no cervical lymphadenopathy Cardiovascular: RRR, No MRG Respiratory: CTA B Gastrointestinal: abdomen soft,  NT, ND, BS+ Musculoskeletal: no deformities, strength intact in all 4 Skin: moist, warm, no rashes Neurological: no tremor with outstretched hands, DTR normal in all 4  CMP ( most recent) CMP     Component Value Date/Time   NA 140 12/02/2014 1218   K 3.8 12/02/2014 1218   CL 105 12/02/2014 1218   CO2 28 12/02/2014 1218   GLUCOSE 77 12/02/2014 1218   BUN 19 12/02/2014 1218   CREATININE 1.24* 12/02/2014 1218   CREATININE 1.20* 11/19/2011 1204   CALCIUM 9.3 12/02/2014 1218   PROT 8.6* 03/04/2014 0752   ALBUMIN 3.8 03/04/2014 0752   AST 17 03/04/2014 0752   ALT 16 03/04/2014 0752   ALKPHOS 91 03/04/2014 0752   BILITOT 0.4 03/04/2014 0752   GFRNONAA 50* 12/02/2014 1218   GFRAA 58* 12/02/2014 1218     Lipid Panel ( most recent) Lipid Panel     Component Value Date/Time   CHOL 239* 11/19/2011 1204   TRIG 154* 11/19/2011 1204   HDL 41 11/19/2011 1204   CHOLHDL 5.8 11/19/2011 1204   VLDL 31 11/19/2011 1204   LDLCALC 167* 11/19/2011 1204      November 2016 thyroid ultrasound shows right lobe shrunk at 2.4 cm and left lobe shrunk at 2.3 cm with no discrete nodules.    Assessment & Plan:   1. Hypothyroidism due to non-medication exogenous substances -She is clinically euthyroid. She does not have recent thyroid function tests. I advised her to continue levothyroxine 88 g by mouth every morning.  - We discussed about correct intake of levothyroxine, at fasting, with water, separated by at least 30 minutes from breakfast, and separated by more than 4 hours from calcium, iron, multivitamins, acid reflux medications (PPIs). -Patient is made aware of the fact that thyroid hormone replacement is needed for life, dose to be adjusted by periodic monitoring of thyroid function tests.  She will have thyroid function test today and will return in 1 week for reevaluation. -Absence of goiter nor nodules in her thyroid no further workup is needed with imaging.   - I advised patient to  maintain close follow up with MUSE,ROCHELLE D., PA-C for primary care needs. Follow up plan: Return in about 1 week (around 06/25/2015) for underactive thyroid, labs today.  Glade Lloyd, MD Phone: [phone]  Fax: [phone]   06/18/2015, 4:15 PM

## 2015-06-25 ENCOUNTER — Ambulatory Visit: Payer: Self-pay | Admitting: "Endocrinology

## 2015-07-28 ENCOUNTER — Telehealth: Payer: Self-pay

## 2015-07-28 ENCOUNTER — Encounter: Payer: Self-pay | Admitting: Gastroenterology

## 2015-07-28 NOTE — Telephone Encounter (Signed)
Pt came by the office, she is c/o nausea and vomiting at least 2-3x a day. She does not have a fever, no diarrhea, no constipation. She said it has gotten worse throughout the past 2 weeks. She is wanting something sent in for her. She is aware that it may be tomorrow before anything can be done.

## 2015-07-29 MED ORDER — ONDANSETRON 4 MG PO TBDP: 4.0000 mg | ORAL_TABLET | Freq: Four times a day (QID) | ORAL | Status: DC | PRN

## 2015-07-29 NOTE — Telephone Encounter (Signed)
rx has been sent in

## 2015-07-29 NOTE — Telephone Encounter (Signed)
We can try Zofran orally disintegrating tablets; dispense 20; 1 orally every 6 hours as needed for nausea to me: No refills. Maintain hydration. If no better in a couple of days, needs to come in for a visit

## 2015-07-30 ENCOUNTER — Telehealth: Payer: Self-pay | Admitting: Gastroenterology

## 2015-07-30 NOTE — Telephone Encounter (Signed)
See email from patient. Nausea already addressed.

## 2015-08-15 ENCOUNTER — Encounter: Payer: Self-pay | Admitting: Gastroenterology

## 2015-08-18 ENCOUNTER — Ambulatory Visit (INDEPENDENT_AMBULATORY_CARE_PROVIDER_SITE_OTHER): Payer: Self-pay | Admitting: Gastroenterology

## 2015-08-18 ENCOUNTER — Encounter: Payer: Self-pay | Admitting: Gastroenterology

## 2015-08-18 VITALS — BP 171/83 | HR 73 | Temp 98.0°F | Ht 62.0 in | Wt 191.2 lb

## 2015-08-18 DIAGNOSIS — K3184 Gastroparesis: Secondary | ICD-10-CM

## 2015-08-18 DIAGNOSIS — K5909 Other constipation: Secondary | ICD-10-CM

## 2015-08-18 DIAGNOSIS — K219 Gastro-esophageal reflux disease without esophagitis: Secondary | ICD-10-CM

## 2015-08-18 MED ORDER — PROMETHAZINE HCL 25 MG PO TABS
ORAL_TABLET | ORAL | Status: DC
Start: 2015-08-18 — End: 2016-01-11

## 2015-08-18 NOTE — Progress Notes (Signed)
CC'ED TO PCP 

## 2015-08-18 NOTE — Progress Notes (Signed)
      Primary Care Physician: Raiford Simmonds., PA-C  Primary Gastroenterologist:  Garfield Cornea, MD   Chief Complaint  Patient presents with  . Follow-up    HPI: ITATY STRENGTH is a 50 y.o. female here for follow up of Constipation, idiopathic gastroparesis, GERD. She was last seen in October 2016.EGD July 2016 for refractory GERD and esophageal dysphagia showed focal antral erosions, normal appearing esophagus status post empiric dilation, biopsies benign. Colonoscopy at the same time for screening purposes was unremarkable. Next colonoscopy planned for July 2026.  Weight is up an additional 5 pounds since we last saw her. She did see Dr. Dorris Fetch regarding her thyroid disease a couple months ago.Overall she feels well. She has mostly good days with regards to her gastroparesis. Nausea more significant about once per week. No vomiting. Bowel movements are regular with MiraLAX. Denies abdominal pain. No blood in the stool or melena. Rarely has heartburn. She finds her self without insurance. She states it was an issue with her insurance enrollment apparently.   Current Outpatient Prescriptions  Medication Sig Dispense Refill  . acetaminophen (TYLENOL) 500 MG tablet Take 500 mg by mouth daily as needed for mild pain or moderate pain.    Marland Kitchen levothyroxine (SYNTHROID, LEVOTHROID) 88 MCG tablet Take 88 mcg by mouth daily before breakfast.    . polyethylene glycol (MIRALAX / GLYCOLAX) packet Take 17 g by mouth daily.    . promethazine (PHENERGAN) 25 MG tablet Take 1/2 to 1 tablet every six hour as needed for nausea. May cause drowsiness. 30 tablet 0  . sodium chloride (OCEAN) 0.65 % SOLN nasal spray Place 1 spray into both nostrils as needed for congestion.     No current facility-administered medications for this visit.    Allergies as of 08/18/2015 - Review Complete 08/18/2015  Allergen Reaction Noted  . Codeine Nausea And Vomiting     ROS:  General: Negative for anorexia, weight  loss, fever, chills, fatigue, weakness. ENT: Negative for hoarseness, difficulty swallowing , nasal congestion. CV: Negative for chest pain, angina, palpitations, dyspnea on exertion, peripheral edema.  Respiratory: Negative for dyspnea at rest, dyspnea on exertion, cough, sputum, wheezing.  GI: See history of present illness. GU:  Negative for dysuria, hematuria, urinary incontinence, urinary frequency, nocturnal urination.  Endo: Negative for unusual weight change.    Physical Examination:   BP 171/83 mmHg  Pulse 73  Temp(Src) 98 F (36.7 C)  Ht 5\' 2"  (1.575 m)  Wt 191 lb 3.2 oz (86.728 kg)  BMI 34.96 kg/m2  General: Well-nourished, well-developed in no acute distress.  Eyes: No icterus. Mouth: Oropharyngeal mucosa moist and pink , no lesions erythema or exudate. Lungs: Clear to auscultation bilaterally.  Heart: Regular rate and rhythm, no murmurs rubs or gallops.  Abdomen: Bowel sounds are normal, nontender, nondistended, no hepatosplenomegaly or masses, no abdominal bruits or hernia , no rebound or guarding.   Extremities: No lower extremity edema. No clubbing or deformities. Neuro: Alert and oriented x 4   Skin: Warm and dry, no jaundice.   Psych: Alert and cooperative, normal mood and affect.

## 2015-08-18 NOTE — Assessment & Plan Note (Signed)
Continue MiraLAX. Continue high-fiber diet and plenty of water.

## 2015-08-18 NOTE — Patient Instructions (Signed)
1. Phenergan as needed for moderate to severe nausea.  2. Return to the office in one year. 3. Call the [facility] insurance commissioner regarding your insurance concerns.

## 2015-08-18 NOTE — Assessment & Plan Note (Signed)
Doing fairly well. Weight is up a few more pounds. Nausea less frequent. Could not afford Zofran because of lack of insurance. Phenergan Rx to be taken moderate to severe nausea only. Return to the office in one year or sooner if needed.

## 2015-11-10 ENCOUNTER — Encounter: Payer: Self-pay | Admitting: Gastroenterology

## 2015-11-30 ENCOUNTER — Encounter: Payer: Self-pay | Admitting: Gastroenterology

## 2016-01-11 ENCOUNTER — Encounter (HOSPITAL_COMMUNITY): Payer: Self-pay | Admitting: Emergency Medicine

## 2016-01-11 ENCOUNTER — Emergency Department (HOSPITAL_COMMUNITY)
Admission: EM | Admit: 2016-01-11 | Discharge: 2016-01-11 | Disposition: A | Discharge status: Home / Self Care | Payer: No Typology Code available for payment source | Attending: Emergency Medicine | Admitting: Emergency Medicine

## 2016-01-11 ENCOUNTER — Emergency Department (HOSPITAL_COMMUNITY): Payer: No Typology Code available for payment source

## 2016-01-11 DIAGNOSIS — Y92410 Unspecified street and highway as the place of occurrence of the external cause: Secondary | ICD-10-CM | POA: Diagnosis not present

## 2016-01-11 DIAGNOSIS — Y9389 Activity, other specified: Secondary | ICD-10-CM | POA: Diagnosis not present

## 2016-01-11 DIAGNOSIS — E039 Hypothyroidism, unspecified: Secondary | ICD-10-CM | POA: Diagnosis not present

## 2016-01-11 DIAGNOSIS — Y999 Unspecified external cause status: Secondary | ICD-10-CM | POA: Insufficient documentation

## 2016-01-11 DIAGNOSIS — Z79899 Other long term (current) drug therapy: Secondary | ICD-10-CM | POA: Insufficient documentation

## 2016-01-11 DIAGNOSIS — S39012A Strain of muscle, fascia and tendon of lower back, initial encounter: Secondary | ICD-10-CM | POA: Diagnosis not present

## 2016-01-11 DIAGNOSIS — S199XXA Unspecified injury of neck, initial encounter: Secondary | ICD-10-CM | POA: Diagnosis present

## 2016-01-11 MED ORDER — CYCLOBENZAPRINE HCL 10 MG PO TABS: 10.0000 mg | ORAL_TABLET | Freq: Two times a day (BID) | ORAL | 0 refills | Status: DC | PRN
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
## 2016-01-11 NOTE — Discharge Instructions (Signed)
Ibuprofen 600 mg every 6 hours as needed for pain.  Flexeril as prescribed as needed for pain not relieved with ibuprofen.  Follow-up with your primary Dr. if not improving in the next week.
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
## 2016-01-22 ENCOUNTER — Emergency Department (HOSPITAL_COMMUNITY)
Admission: EM | Admit: 2016-01-22 | Discharge: 2016-01-22 | Disposition: A | Discharge status: Home / Self Care | Payer: Self-pay | Attending: Emergency Medicine | Admitting: Emergency Medicine

## 2016-01-22 ENCOUNTER — Encounter (HOSPITAL_COMMUNITY): Payer: Self-pay | Admitting: Emergency Medicine

## 2016-01-22 DIAGNOSIS — E039 Hypothyroidism, unspecified: Secondary | ICD-10-CM | POA: Insufficient documentation

## 2016-01-22 DIAGNOSIS — J069 Acute upper respiratory infection, unspecified: Secondary | ICD-10-CM | POA: Insufficient documentation

## 2016-01-22 DIAGNOSIS — J029 Acute pharyngitis, unspecified: Secondary | ICD-10-CM

## 2016-01-22 MED ORDER — AMOXICILLIN 250 MG PO CAPS
500.0000 mg | ORAL_CAPSULE | Freq: Once | ORAL | Status: AC
  Administered 2016-01-22: 500 mg via ORAL
  Filled 2016-01-22: qty 2

## 2016-01-22 MED ORDER — AMOXICILLIN 500 MG PO CAPS: 500.0000 mg | ORAL_CAPSULE | Freq: Three times a day (TID) | ORAL | 0 refills | Status: DC

## 2016-01-22 MED ORDER — IBUPROFEN 800 MG PO TABS
800.0000 mg | ORAL_TABLET | Freq: Once | ORAL | Status: AC
  Administered 2016-01-22: 800 mg via ORAL
  Filled 2016-01-22: qty 1

## 2016-01-22 MED ORDER — IBUPROFEN 600 MG PO TABS: 600.0000 mg | ORAL_TABLET | Freq: Four times a day (QID) | ORAL | 0 refills | Status: DC

## 2016-01-22 NOTE — ED Triage Notes (Signed)
Pt reports sore throat that started 4 days ago. Pt states pain radiates up into her R ear.

## 2016-01-22 NOTE — ED Provider Notes (Signed)
[facility] DEPT Provider Note   CSN: [id number] Arrival date & time: 01/22/16  1809     History   Chief Complaint Chief Complaint  Patient presents with  . Sore Throat    HPI [name] [name] is a 50 y.o. female.  Patient is a 50 year old female who presents to the emergency department with a complaint of sore throat.  The patient states that about 4 days ago she started having problems with sore throat and congestion. She states now the pain on moves in toward her right ear. She can get liquids to use: Around, but it is very painful. She also states he's been having problems with headache. There's been no diarrhea, there's been no vomiting, and no unusual rash.   The history is provided by the patient.  Sore Throat  This is a new problem. Associated symptoms include headaches.    Past Medical History:  Diagnosis Date  . Grave's disease    diagnosed years ago/ now hypothyroidism  . IBS (irritable colon syndrome)    constipation predominant  . Nondiabetic gastroparesis     Patient Active Problem List   Diagnosis Date Noted  . Colon cancer screening   . Mucosal abnormality of stomach   . Dysphagia, pharyngoesophageal phase   . Esophageal dysphagia 10/15/2014  . Chronic fatigue 04/10/2014  . Encounter for screening colonoscopy 09/05/2013  . Chest wall pain 09/11/2012  . Hypothyroidism 11/18/2011  . Obesity, unspecified 11/18/2011  . GERD 12/05/2008  . GASTROPARESIS 04/03/2008  . CONSTIPATION, CHRONIC 04/03/2008    Past Surgical History:  Procedure Laterality Date  . ABDOMINAL HYSTERECTOMY    . attempted colonoscopy  03/2004   Dr. Rourk--> prep was poor. Exam to 40 cm. No gross abnormalities. Internal hemorrhoids. Air contrast barium enema was normal.  . CHOLECYSTECTOMY    . COLONOSCOPY N/A 10/30/2014   RMR: normal  . ESOPHAGEAL DILATION N/A 10/30/2014   Procedure: ESOPHAGEAL DILATION;  Surgeon: Daneil Dolin, MD;  Location: [facility];  Service:  Endoscopy;  Laterality: N/A;  . ESOPHAGOGASTRODUODENOSCOPY  01/2004   Dr. Almyra Free small polyps in postbulbar area ablated  . ESOPHAGOGASTRODUODENOSCOPY N/A 10/30/2014   [id number]  . S/P Hysterectomy     . TONSILLECTOMY    . TUBAL LIGATION      OB History    Gravida Para Term Preterm AB Living   1 1 1     1    SAB TAB Ectopic Multiple Live Births                   Home Medications    Prior to Admission medications   Medication Sig Start Date End Date Taking? Authorizing Provider  acetaminophen (TYLENOL) 500 MG tablet Take 500 mg by mouth daily as needed for mild pain or moderate pain.    Historical Provider, MD  cyclobenzaprine (FLEXERIL) 10 MG tablet Take 1 tablet (10 mg total) by mouth 2 (two) times daily as needed for muscle spasms. 01/11/16   Veryl Speak, MD  levothyroxine (SYNTHROID, LEVOTHROID) 88 MCG tablet Take 88 mcg by mouth daily before breakfast.    Historical Provider, MD  polyethylene glycol (MIRALAX / GLYCOLAX) packet Take 17 g by mouth daily.    Historical Provider, MD  sodium chloride (OCEAN) 0.65 % SOLN nasal spray Place 1 spray into both nostrils as needed for congestion.    Historical Provider, MD    Family History Family History  Problem Relation Age of Onset  . Other Father 63  hit by train  . Heart disease Mother   . Kidney disease Mother   . Colon cancer Neg Hx     Social History Social History  Substance Use Topics  . Smoking status: Never Smoker  . Smokeless tobacco: Never Used  . Alcohol use Yes     Comment: occ     Allergies   Codeine   Review of Systems Review of Systems  Constitutional: Positive for appetite change, chills and fatigue.  HENT: Positive for congestion and sore throat.   Skin: Negative for rash.  Neurological: Positive for headaches.  All other systems reviewed and are negative.    Physical Exam Updated Vital Signs BP 162/86   Pulse 107   Temp 98.9 F (37.2 C) (Oral)   Resp 18   Ht 5' (1.524 m)    Wt 85.7 kg   SpO2 100%   BMI 36.91 kg/m   Physical Exam  Constitutional: She is oriented to person, place, and time. She appears well-developed and well-nourished.  Non-toxic appearance.  HENT:  Head: Normocephalic.  Right Ear: Tympanic membrane and external ear normal.  Left Ear: Tympanic membrane and external ear normal.  There is some increased redness of the posterior pharynx. The uvula is midline, mildly swollen. The airway is patent on.  Eyes: EOM and lids are normal. Pupils are equal, round, and reactive to light.  Neck: Normal range of motion. Neck supple. Carotid bruit is not present.  Cardiovascular: Normal rate, regular rhythm, normal heart sounds, intact distal pulses and normal pulses.   Pulmonary/Chest: Breath sounds normal. No respiratory distress.  Abdominal: Soft. Bowel sounds are normal. There is no tenderness. There is no guarding.  Musculoskeletal: Normal range of motion.  Lymphadenopathy:       Head (right side): No submandibular adenopathy present.       Head (left side): No submandibular adenopathy present.    She has no cervical adenopathy.  Neurological: She is alert and oriented to person, place, and time. She has normal strength. No cranial nerve deficit or sensory deficit.  Skin: Skin is warm and dry.  Psychiatric: She has a normal mood and affect. Her speech is normal.  Nursing note and vitals reviewed.    ED Treatments / Results  Labs (all labs ordered are listed, but only abnormal results are displayed) Labs Reviewed - No data to display  EKG  EKG Interpretation None       Radiology No results found.  Procedures Procedures (including critical care time)  Medications Ordered in ED Medications  ibuprofen (ADVIL,MOTRIN) tablet 800 mg (not administered)  amoxicillin (AMOXIL) capsule 500 mg (not administered)     Initial Impression / Assessment and Plan / ED Course  I have reviewed the triage vital signs and the nursing  notes.  Pertinent labs & imaging results that were available during my care of the patient were reviewed by me and considered in my medical decision making (see chart for details).  Clinical Course    **I have reviewed nursing notes, vital signs, and all appropriate lab and imaging results for this patient.*  Final Clinical Impressions(s) / ED Diagnoses  The vital signs are within normal limits with exception of the heart rate being elevated at 107, and the blood pressure being elevated at 162/86. Pulse oximetry is 100% on room air. The examination favors pharyngitis and upper respiratory infection. The patient will be treated with Amoxil and ibuprofen. I've also suggested that the patient use salt water gargles and Chloraseptic spray.  We discussed good handwashing, and we discussed maintaining good hydration. Patient is in agreement with this plan.    Final diagnoses:  None    New Prescriptions New Prescriptions   No medications on file     Lily Kocher, PA-C 01/22/16 1927    Ezequiel Essex, MD 01/23/16 [phone]

## 2016-01-22 NOTE — Discharge Instructions (Signed)
Please use Amoxil 3 times daily. Use 600 mg of ibuprofen with breakfast, lunch, dinner, and at bedtime. Salt water gargles 3 or 4 times daily will be helpful. Chloraseptic Spray/gargles will also be helpful. It is important to wash hands frequently. Keep your distance from others as this is contagious.

## 2016-02-04 ENCOUNTER — Encounter: Payer: Self-pay | Admitting: Gastroenterology

## 2016-02-05 ENCOUNTER — Emergency Department (HOSPITAL_COMMUNITY)
Admission: EM | Admit: 2016-02-05 | Discharge: 2016-02-05 | Disposition: A | Discharge status: Home / Self Care | Payer: Self-pay | Attending: Emergency Medicine | Admitting: Emergency Medicine

## 2016-02-05 ENCOUNTER — Encounter (HOSPITAL_COMMUNITY): Payer: Self-pay | Admitting: Emergency Medicine

## 2016-02-05 DIAGNOSIS — R110 Nausea: Secondary | ICD-10-CM | POA: Insufficient documentation

## 2016-02-05 DIAGNOSIS — E039 Hypothyroidism, unspecified: Secondary | ICD-10-CM | POA: Insufficient documentation

## 2016-02-05 DIAGNOSIS — Z791 Long term (current) use of non-steroidal anti-inflammatories (NSAID): Secondary | ICD-10-CM | POA: Insufficient documentation

## 2016-02-05 DIAGNOSIS — Z79899 Other long term (current) drug therapy: Secondary | ICD-10-CM | POA: Insufficient documentation

## 2016-02-05 DIAGNOSIS — R1013 Epigastric pain: Secondary | ICD-10-CM | POA: Insufficient documentation

## 2016-02-05 LAB — CBC WITH DIFFERENTIAL/PLATELET
BASOS ABS: 0 10*3/uL (ref 0.0–0.1)
Basophils Relative: 0 %
EOS PCT: 48 %
Eosinophils Absolute: 4.9 10*3/uL — ABNORMAL HIGH (ref 0.0–0.7)
HEMATOCRIT: 37.4 % (ref 36.0–46.0)
HEMOGLOBIN: 11.5 g/dL — AB (ref 12.0–15.0)
LYMPHS ABS: 2.7 10*3/uL (ref 0.7–4.0)
LYMPHS PCT: 26 %
MCH: 27 pg (ref 26.0–34.0)
MCHC: 30.7 g/dL (ref 30.0–36.0)
MCV: 87.8 fL (ref 78.0–100.0)
MONOS PCT: 5 %
Monocytes Absolute: 0.5 10*3/uL (ref 0.1–1.0)
NEUTROS PCT: 21 %
Neutro Abs: 2.1 10*3/uL (ref 1.7–7.7)
Platelets: 404 10*3/uL — ABNORMAL HIGH (ref 150–400)
RBC: 4.26 MIL/uL (ref 3.87–5.11)
RDW: 13.5 % (ref 11.5–15.5)
WBC: 10.2 10*3/uL (ref 4.0–10.5)

## 2016-02-05 LAB — URINALYSIS, ROUTINE W REFLEX MICROSCOPIC
Bilirubin Urine: NEGATIVE
Glucose, UA: NEGATIVE mg/dL
Hgb urine dipstick: NEGATIVE
Ketones, ur: NEGATIVE mg/dL
LEUKOCYTES UA: NEGATIVE
NITRITE: NEGATIVE
SPECIFIC GRAVITY, URINE: 1.015 (ref 1.005–1.030)
pH: 7.5 (ref 5.0–8.0)

## 2016-02-05 LAB — COMPREHENSIVE METABOLIC PANEL
ALBUMIN: 3.8 g/dL (ref 3.5–5.0)
ALK PHOS: 91 U/L (ref 38–126)
ALT: 21 U/L (ref 14–54)
ANION GAP: 6 (ref 5–15)
AST: 21 U/L (ref 15–41)
BUN: 10 mg/dL (ref 6–20)
CALCIUM: 9.3 mg/dL (ref 8.9–10.3)
CO2: 29 mmol/L (ref 22–32)
Chloride: 103 mmol/L (ref 101–111)
Creatinine, Ser: 1.03 mg/dL — ABNORMAL HIGH (ref 0.44–1.00)
GFR calc Af Amer: >60 mL/min (ref >60)
GFR calc non Af Amer: >60 mL/min (ref >60)
GLUCOSE: 133 mg/dL — AB (ref 65–99)
Potassium: 3.9 mmol/L (ref 3.5–5.1)
SODIUM: 138 mmol/L (ref 135–145)
Total Bilirubin: 0.7 mg/dL (ref 0.3–1.2)
Total Protein: 8.3 g/dL — ABNORMAL HIGH (ref 6.5–8.1)

## 2016-02-05 LAB — URINE MICROSCOPIC-ADD ON

## 2016-02-05 LAB — LIPASE, BLOOD: Lipase: 43 U/L (ref 11–51)

## 2016-02-05 MED ORDER — FAMOTIDINE 20 MG PO TABS: 20.0000 mg | ORAL_TABLET | Freq: Two times a day (BID) | ORAL | 0 refills | Status: DC

## 2016-02-05 MED ORDER — ONDANSETRON 4 MG PO TBDP: 4.0000 mg | ORAL_TABLET | Freq: Three times a day (TID) | ORAL | 0 refills | Status: DC | PRN

## 2016-02-05 MED ORDER — GI COCKTAIL ~~LOC~~
30.0000 mL | Freq: Once | ORAL | Status: AC
  Administered 2016-02-05: 30 mL via ORAL
  Filled 2016-02-05: qty 30

## 2016-02-05 MED ORDER — PANTOPRAZOLE SODIUM 40 MG PO TBEC: 40.0000 mg | DELAYED_RELEASE_TABLET | Freq: Two times a day (BID) | ORAL | 0 refills | Status: DC

## 2016-02-05 MED ORDER — ONDANSETRON 4 MG PO TBDP
4.0000 mg | ORAL_TABLET | Freq: Once | ORAL | Status: AC
  Administered 2016-02-05: 4 mg via ORAL
  Filled 2016-02-05: qty 1

## 2016-02-05 NOTE — ED Triage Notes (Signed)
Pt reports upper abdominal pain and nausea that started 1 week ago. Pt denies diarrhea.

## 2016-02-05 NOTE — ED Provider Notes (Signed)
[facility] DEPT Provider Note   CSN: [id number] Arrival date & time: 02/05/16  1408     History   Chief Complaint Chief Complaint  Patient presents with  . Abdominal Pain    HPI [name] [name] is a 50 y.o. female.  HPI  50 year old female presents with 2 weeks of upper abdominal pain. She states it started after she got over an upper respiratory infection and sore throat. The pain come and goes. Does not radiate. There is no chest pain or shortness of breath. There is no exertional component. She has associated nausea but no vomiting. The pain and nausea seemed to calm after eating but also occurs at other times. However it is pretty consistent after eating. There is no back pain or lower abdominal pain. There are no urinary symptoms. She called her gastroenterologist, Dr. Gala Romney, who has set up an appointment for 10/16. She has not tried anything for her pain. This is not similar to when she had her EGD last year. Has had her GB out several years ago.   Past Medical History:  Diagnosis Date  . Grave's disease    diagnosed years ago/ now hypothyroidism  . IBS (irritable colon syndrome)    constipation predominant  . Nondiabetic gastroparesis     Patient Active Problem List   Diagnosis Date Noted  . Colon cancer screening   . Mucosal abnormality of stomach   . Dysphagia, pharyngoesophageal phase   . Esophageal dysphagia 10/15/2014  . Chronic fatigue 04/10/2014  . Encounter for screening colonoscopy 09/05/2013  . Chest wall pain 09/11/2012  . Hypothyroidism 11/18/2011  . Obesity, unspecified 11/18/2011  . GERD 12/05/2008  . GASTROPARESIS 04/03/2008  . CONSTIPATION, CHRONIC 04/03/2008    Past Surgical History:  Procedure Laterality Date  . ABDOMINAL HYSTERECTOMY    . attempted colonoscopy  03/2004   Dr. Rourk--> prep was poor. Exam to 40 cm. No gross abnormalities. Internal hemorrhoids. Air contrast barium enema was normal.  . CHOLECYSTECTOMY    . COLONOSCOPY  N/A 10/30/2014   RMR: normal  . ESOPHAGEAL DILATION N/A 10/30/2014   Procedure: ESOPHAGEAL DILATION;  Surgeon: Daneil Dolin, MD;  Location: [facility];  Service: Endoscopy;  Laterality: N/A;  . ESOPHAGOGASTRODUODENOSCOPY  01/2004   Dr. Almyra Free small polyps in postbulbar area ablated  . ESOPHAGOGASTRODUODENOSCOPY N/A 10/30/2014   XTK:[id number]  . S/P Hysterectomy     . TONSILLECTOMY    . TUBAL LIGATION      OB History    Gravida Para Term Preterm AB Living   1 1 1     1    SAB TAB Ectopic Multiple Live Births                   Home Medications    Prior to Admission medications   Medication Sig Start Date End Date Taking? Authorizing Provider  acetaminophen (TYLENOL) 500 MG tablet Take 500 mg by mouth every 6 (six) hours as needed for mild pain, moderate pain or headache.    Yes Historical Provider, MD  cyclobenzaprine (FLEXERIL) 10 MG tablet Take 1 tablet (10 mg total) by mouth 2 (two) times daily as needed for muscle spasms. 01/11/16  Yes Veryl Speak, MD  ibuprofen (ADVIL,MOTRIN) 600 MG tablet Take 1 tablet (600 mg total) by mouth 4 (four) times daily. 01/22/16  Yes Lily Kocher, PA-C  levothyroxine (SYNTHROID, LEVOTHROID) 88 MCG tablet Take 88 mcg by mouth daily before breakfast.   Yes Historical Provider, MD  polyethylene glycol (MIRALAX /  GLYCOLAX) packet Take 17 g by mouth daily.   Yes Historical Provider, MD  sodium chloride (OCEAN) 0.65 % SOLN nasal spray Place 1 spray into both nostrils as needed for congestion.   Yes Historical Provider, MD  famotidine (PEPCID) 20 MG tablet Take 1 tablet (20 mg total) by mouth 2 (two) times daily. 02/05/16   Sherwood Gambler, MD  ondansetron (ZOFRAN ODT) 4 MG disintegrating tablet Take 1 tablet (4 mg total) by mouth every 8 (eight) hours as needed for nausea or vomiting. 02/05/16   Sherwood Gambler, MD  pantoprazole (PROTONIX) 40 MG tablet Take 1 tablet (40 mg total) by mouth 2 (two) times daily. 02/05/16   Sherwood Gambler, MD    Family  History Family History  Problem Relation Age of Onset  . Other Father 91    hit by train  . Heart disease Mother   . Kidney disease Mother   . Colon cancer Neg Hx     Social History Social History  Substance Use Topics  . Smoking status: Never Smoker  . Smokeless tobacco: Never Used  . Alcohol use Yes     Comment: occ     Allergies   Codeine   Review of Systems Review of Systems  Respiratory: Negative for shortness of breath.   Cardiovascular: Negative for chest pain.  Gastrointestinal: Positive for abdominal pain and nausea. Negative for vomiting.  Genitourinary: Negative for dysuria.  Musculoskeletal: Negative for back pain.  All other systems reviewed and are negative.    Physical Exam Updated Vital Signs BP 153/88   Pulse 78   Temp 98.1 F (36.7 C) (Oral)   Resp 13   Ht 5' (1.524 m)   Wt 185 lb (83.9 kg)   SpO2 97%   BMI 36.13 kg/m   Physical Exam  Constitutional: She is oriented to person, place, and time. She appears well-developed and well-nourished.  HENT:  Head: Normocephalic and atraumatic.  Right Ear: External ear normal.  Left Ear: External ear normal.  Nose: Nose normal.  Eyes: Right eye exhibits no discharge. Left eye exhibits no discharge.  Cardiovascular: Normal rate, regular rhythm and normal heart sounds.   Pulmonary/Chest: Effort normal and breath sounds normal.  Abdominal: Soft. There is tenderness (mild) in the epigastric area. There is no CVA tenderness.  Neurological: She is alert and oriented to person, place, and time.  Skin: Skin is warm and dry.  Nursing note and vitals reviewed.    ED Treatments / Results  Labs (all labs ordered are listed, but only abnormal results are displayed) Labs Reviewed  URINALYSIS, ROUTINE W REFLEX MICROSCOPIC (NOT AT [facility]) - Abnormal; Notable for the following:       Result Value   Protein, ur TRACE (*)    All other components within normal limits  CBC WITH DIFFERENTIAL/PLATELET - Abnormal;  Notable for the following:    Hemoglobin 11.5 (*)    Platelets 404 (*)    All other components within normal limits  COMPREHENSIVE METABOLIC PANEL - Abnormal; Notable for the following:    Glucose, Bld 133 (*)    Creatinine, Ser 1.03 (*)    Total Protein 8.3 (*)    All other components within normal limits  URINE MICROSCOPIC-ADD ON - Abnormal; Notable for the following:    Squamous Epithelial / LPF 6-30 (*)    Bacteria, UA FEW (*)    All other components within normal limits  LIPASE, BLOOD    EKG  EKG Interpretation  Date/Time:  Thursday  February 05 2016 15:55:19 EDT Ventricular Rate:  76 PR Interval:    QRS Duration: 89 QT Interval:  374 QTC Calculation: 421 R Axis:   42 Text Interpretation:  Sinus rhythm no significant change since 2008 Confirmed by Martita Brumm MD, Muzamil Harker [phone]) on 02/05/2016 4:26:23 PM       Radiology No results found.  Procedures Procedures (including critical care time)  Medications Ordered in ED Medications  ondansetron (ZOFRAN-ODT) disintegrating tablet 4 mg (4 mg Oral Given 02/05/16 1549)  gi cocktail (Maalox,Lidocaine,Donnatal) (30 mLs Oral Given 02/05/16 1549)     Initial Impression / Assessment and Plan / ED Course  I have reviewed the triage vital signs and the nursing notes.  Pertinent labs & imaging results that were available during my care of the patient were reviewed by me and considered in my medical decision making (see chart for details).  Clinical Course  Comment By Time  Her symptoms are probably gastric. She does not have a GB, thus u/s would not be helpful. Unless labs are significantly off, I don't think CT would help. GI cocktail, ECG, likely d/c if labs are ok. Sherwood Gambler, MD 10/12 [phone]  Patient feels better since GI cocktail and zofran. Will d/c with PPI, pepcid, zofran. ECG benign and unchanged. I think this is all stomach related but low suspicion for acute GI bleed or perforation Sherwood Gambler, MD 10/12 1638     Final Clinical Impressions(s) / ED Diagnoses   Final diagnoses:  Epigastric abdominal pain    New Prescriptions New Prescriptions   FAMOTIDINE (PEPCID) 20 MG TABLET    Take 1 tablet (20 mg total) by mouth 2 (two) times daily.   ONDANSETRON (ZOFRAN ODT) 4 MG DISINTEGRATING TABLET    Take 1 tablet (4 mg total) by mouth every 8 (eight) hours as needed for nausea or vomiting.   PANTOPRAZOLE (PROTONIX) 40 MG TABLET    Take 1 tablet (40 mg total) by mouth 2 (two) times daily.     Sherwood Gambler, MD 02/05/16 [phone]

## 2016-02-06 ENCOUNTER — Encounter: Payer: Self-pay | Admitting: "Endocrinology

## 2016-02-06 ENCOUNTER — Encounter: Payer: Self-pay | Admitting: Gastroenterology

## 2016-02-09 ENCOUNTER — Encounter: Payer: Self-pay | Admitting: Nurse Practitioner

## 2016-02-09 ENCOUNTER — Ambulatory Visit (INDEPENDENT_AMBULATORY_CARE_PROVIDER_SITE_OTHER): Payer: Self-pay | Admitting: Nurse Practitioner

## 2016-02-09 VITALS — BP 148/93 | HR 89 | Temp 98.2°F | Ht 60.0 in | Wt 188.0 lb

## 2016-02-09 DIAGNOSIS — R1013 Epigastric pain: Secondary | ICD-10-CM

## 2016-02-09 DIAGNOSIS — K5909 Other constipation: Secondary | ICD-10-CM

## 2016-02-09 DIAGNOSIS — K219 Gastro-esophageal reflux disease without esophagitis: Secondary | ICD-10-CM

## 2016-02-09 MED ORDER — PANTOPRAZOLE SODIUM 40 MG PO TBEC: 40.0000 mg | DELAYED_RELEASE_TABLET | Freq: Two times a day (BID) | ORAL | 3 refills | Status: DC

## 2016-02-09 MED ORDER — SUCRALFATE 1 G PO TABS: 1.0000 g | ORAL_TABLET | Freq: Three times a day (TID) | ORAL | 2 refills | Status: DC

## 2016-02-09 NOTE — Progress Notes (Signed)
cc'ed to pcp °

## 2016-02-09 NOTE — Assessment & Plan Note (Signed)
Seems to be having an exacerbation of her constipation. Typically goes twice a day, only currently going once a day. Stools are not overtly hard or difficult to pass, however does have sensation of incomplete emptying. I commended fiber supplement, increase water, and can take MiraLAX up to twice a day rather than just once a day if that helps. Return for follow-up in 3 months.

## 2016-02-09 NOTE — Assessment & Plan Note (Signed)
Chronic GERD with an exacerbation as likely etiology for her symptoms today. She is not currently taking Protonix or any other PPI. I will refill her Protonix, provide patient education information for trigger avoidance. Recommend she continue to avoid NSAIDs and aspirin powders. I will also give her Carafate as a breakthrough treatment. Return for follow-up in 3 months.

## 2016-02-09 NOTE — Progress Notes (Signed)
Referring Provider: Raiford Simmonds., PA-C Primary Care Physician:  Raiford Simmonds., PA-C Primary GI:  Dr. Gala Romney  Chief Complaint  Patient presents with  . Abdominal Pain    HPI:   [name] [name] is a 50 y.o. female who presents for follow-up on abdominal pain. She was last seen in our office for 20 08/14/2015 for follow-up of constipation, idiopathic gastroparesis, GERD. EGD 2016 for refractory GERD and esophageal dysphagia showed focal antral erosions, normal appearing esophagus status post empiric dilation, biopsies benign. Colonoscopy the same time unremarkable. Next colonoscopy July 2026. Esther paresis at this time is doing fairly well, weight of a couple pounds, less frequent nausea. Unable to afford Zofran. Recommend continue MiraLAX and high fiber diet with plenty of water for constipation. Return for follow-up in one year. Email 02/04/2016 indicating patient with worsening abdominal pain. Seen in the ER 02/05/2016 for the same and in the ER she indicated 2 weeks of upper abdominal pain generally consistently worse after eating. Noted improvement after GI cocktail and Zofran. She was discharged to home on Pepcid, Zofran, PPI. ECG benign. The day after being seen in the emergency Department she again emailed saying she was having worsening constipation despite MiraLAX.  Today she states she's not doing well. Is not currentlty on Protonix as she ran out and hasn't called for a refill. Abdominal pain is epigastric as well as generalized. Decreased appetite. Worsening pain after eating but also has frequent pain when not eating. Has nausea but no vomiting. Has a bowel movement once a day, normally twice a day. Stools not hard and are "soft and pass easily" but sensation of incomplete emptying. No improvement in abdominal pain after bowel movement. Denies hematochezia, melena. Denies unintentional weight loss. Denies chest pain, dyspnea, dizziness, lightheadedness, syncope, near syncope.  Denies any other upper or lower GI symptoms.  Denies NSAID use, ASA powders.  Past Medical History:  Diagnosis Date  . Grave's disease    diagnosed years ago/ now hypothyroidism  . IBS (irritable colon syndrome)    constipation predominant  . Nondiabetic gastroparesis     Past Surgical History:  Procedure Laterality Date  . ABDOMINAL HYSTERECTOMY    . attempted colonoscopy  03/2004   Dr. Rourk--> prep was poor. Exam to 40 cm. No gross abnormalities. Internal hemorrhoids. Air contrast barium enema was normal.  . CHOLECYSTECTOMY    . COLONOSCOPY N/A 10/30/2014   RMR: normal  . ESOPHAGEAL DILATION N/A 10/30/2014   Procedure: ESOPHAGEAL DILATION;  Surgeon: Daneil Dolin, MD;  Location: [facility];  Service: Endoscopy;  Laterality: N/A;  . ESOPHAGOGASTRODUODENOSCOPY  01/2004   Dr. Almyra Free small polyps in postbulbar area ablated  . ESOPHAGOGASTRODUODENOSCOPY N/A 10/30/2014   BPZ:WCHENI  . S/P Hysterectomy     . TONSILLECTOMY    . TUBAL LIGATION      Current Outpatient Prescriptions  Medication Sig Dispense Refill  . acetaminophen (TYLENOL) 500 MG tablet Take 500 mg by mouth every 6 (six) hours as needed for mild pain, moderate pain or headache.     . famotidine (PEPCID) 20 MG tablet Take 1 tablet (20 mg total) by mouth 2 (two) times daily. 10 tablet 0  . levothyroxine (SYNTHROID, LEVOTHROID) 88 MCG tablet Take 88 mcg by mouth daily before breakfast.    . polyethylene glycol (MIRALAX / GLYCOLAX) packet Take 17 g by mouth daily.    . sodium chloride (OCEAN) 0.65 % SOLN nasal spray Place 1 spray into both nostrils as needed for  congestion.    . pantoprazole (PROTONIX) 40 MG tablet Take 1 tablet (40 mg total) by mouth 2 (two) times daily. (Patient not taking: Reported on 02/09/2016) 14 tablet 0   No current facility-administered medications for this visit.     Allergies as of 02/09/2016 - Review Complete 02/09/2016  Allergen Reaction Noted  . Codeine Nausea And Vomiting      Family History  Problem Relation Age of Onset  . Other Father 88    hit by train  . Heart disease Mother   . Kidney disease Mother   . Colon cancer Neg Hx     Social History   Social History  . Marital status: Single    Spouse name: N/A  . Number of children: 1  . Years of education: N/A   Occupational History  . unemployed since July 3 [employer]   Social History Main Topics  . Smoking status: Never Smoker  . Smokeless tobacco: Never Used  . Alcohol use Yes     Comment: occ  . Drug use: No  . Sexual activity: Yes    Birth control/ protection: Surgical   Other Topics Concern  . None   Social History Narrative   1 grown healthy daughter    Review of Systems: General: Negative for anorexia, weight loss, fever, chills, fatigue, weakness. ENT: Negative for hoarseness, difficulty swallowing. CV: Negative for chest pain, angina, palpitations, peripheral edema.  Respiratory: Negative for dyspnea at rest, cough, sputum, wheezing.  GI: See history of present illness. Endo: Negative for unusual weight change.    Physical Exam: BP (!) 148/93   Pulse 89   Temp 98.2 F (36.8 C) (Oral)   Ht 5' (1.524 m)   Wt 188 lb (85.3 kg)   BMI 36.72 kg/m  General:   Alert and oriented. Pleasant and cooperative. Well-nourished and well-developed.  Ears:  Normal auditory acuity. Cardiovascular:  S1, S2 present without murmurs appreciated. Extremities without clubbing or edema. Respiratory:  Clear to auscultation bilaterally. No wheezes, rales, or rhonchi. No distress.  Gastrointestinal:  +BS, rounded but soft, and non-distended. Positive epigastric TTP.  No HSM noted. No guarding or rebound. Rectal:  Deferred  Musculoskalatal:  Symmetrical without gross deformities. Neurologic:  Alert and oriented x4;  grossly normal neurologically. Psych:  Alert and cooperative. Normal mood and affect. Heme/Lymph/Immune: No excessive bruising noted.    02/09/2016 9:11  AM   Disclaimer: This note was dictated with voice recognition software. Similar sounding words can inadvertently be transcribed and may not be corrected upon review.

## 2016-02-09 NOTE — Assessment & Plan Note (Signed)
Notable epigastric pain which occurs without eating but is also worse after eating. This is likely due to an exacerbation of her GERD being that she is off Protonix. Return for follow-up in 3 months

## 2016-02-09 NOTE — Patient Instructions (Addendum)
1. I have sent in a refill of your Protonix. Take it twice a day. At your follow-up in 3 months we can discuss cutting it back to once a day. I am trying to help you get over a year worsening symptoms now and then we can back off to maintenance in 3 months. 2. I sent him Carafate 1 g tabs to your pharmacy. Qureshi's and mix with some water to make a "slurry" which he can drink. Uses for severe breakthrough symptoms. 3. I'm providing more acid reflux information below. 4. Avoid trigger foods. Continue to avoid NSAIDs and aspirin powders like Goody powder and BC powder. 5. Return for follow-up in 3 months. 6. Call severe symptoms become worse.     Gastroesophageal Reflux Disease, Adult Normally, food travels down the esophagus and stays in the stomach to be digested. However, when a person has gastroesophageal reflux disease (GERD), food and stomach acid move back up into the esophagus. When this happens, the esophagus becomes sore and inflamed. Over time, GERD can create small holes (ulcers) in the lining of the esophagus.  CAUSES This condition is caused by a problem with the muscle between the esophagus and the stomach (lower esophageal sphincter, or LES). Normally, the LES muscle closes after food passes through the esophagus to the stomach. When the LES is weakened or abnormal, it does not close properly, and that allows food and stomach acid to go back up into the esophagus. The LES can be weakened by certain dietary substances, medicines, and medical conditions, including:  Tobacco use.  Pregnancy.  Having a hiatal hernia.  Heavy alcohol use.  Certain foods and beverages, such as coffee, chocolate, onions, and peppermint. RISK FACTORS This condition is more likely to develop in:  People who have an increased body weight.  People who have connective tissue disorders.  People who use NSAID medicines. SYMPTOMS Symptoms of this condition include:  Heartburn.  Difficult or  painful swallowing.  The feeling of having a lump in the throat.  Abitter taste in the mouth.  Bad breath.  Having a large amount of saliva.  Having an upset or bloated stomach.  Belching.  Chest pain.  Shortness of breath or wheezing.  Ongoing (chronic) cough or a night-time cough.  Wearing away of tooth enamel.  Weight loss. Different conditions can cause chest pain. Make sure to see your health care provider if you experience chest pain. DIAGNOSIS Your health care provider will take a medical history and perform a physical exam. To determine if you have mild or severe GERD, your health care provider may also monitor how you respond to treatment. You may also have other tests, including:  An endoscopy toexamine your stomach and esophagus with a small camera.  A test thatmeasures the acidity level in your esophagus.  A test thatmeasures how much pressure is on your esophagus.  A barium swallow or modified barium swallow to show the shape, size, and functioning of your esophagus. TREATMENT The goal of treatment is to help relieve your symptoms and to prevent complications. Treatment for this condition may vary depending on how severe your symptoms are. Your health care provider may recommend:  Changes to your diet.  Medicine.  Surgery. HOME CARE INSTRUCTIONS Diet  Follow a diet as recommended by your health care provider. This may involve avoiding foods and drinks such as:  Coffee and tea (with or without caffeine).  Drinks that containalcohol.  Energy drinks and sports drinks.  Carbonated drinks or sodas.  Chocolate and cocoa.  Peppermint and mint flavorings.  Garlic and onions.  Horseradish.  Spicy and acidic foods, including peppers, chili powder, curry powder, vinegar, hot sauces, and barbecue sauce.  Citrus fruit juices and citrus fruits, such as oranges, lemons, and limes.  Tomato-based foods, such as red sauce, chili, salsa, and pizza  with red sauce.  Fried and fatty foods, such as donuts, french fries, potato chips, and high-fat dressings.  High-fat meats, such as hot dogs and fatty cuts of red and white meats, such as rib eye steak, sausage, ham, and bacon.  High-fat dairy items, such as whole milk, butter, and cream cheese.  Eat small, frequent meals instead of large meals.  Avoid drinking large amounts of liquid with your meals.  Avoid eating meals during the 2-3 hours before bedtime.  Avoid lying down right after you eat.  Do not exercise right after you eat. General Instructions  Pay attention to any changes in your symptoms.  Take over-the-counter and prescription medicines only as told by your health care provider. Do not take aspirin, ibuprofen, or other NSAIDs unless your health care provider told you to do so.  Do not use any tobacco products, including cigarettes, chewing tobacco, and e-cigarettes. If you need help quitting, ask your health care provider.  Wear loose-fitting clothing. Do not wear anything tight around your waist that causes pressure on your abdomen.  Raise (elevate) the head of your bed 6 inches (15cm).  Try to reduce your stress, such as with yoga or meditation. If you need help reducing stress, ask your health care provider.  If you are overweight, reduce your weight to an amount that is healthy for you. Ask your health care provider for guidance about a safe weight loss goal.  Keep all follow-up visits as told by your health care provider. This is important. SEEK MEDICAL CARE IF:  You have new symptoms.  You have unexplained weight loss.  You have difficulty swallowing, or it hurts to swallow.  You have wheezing or a persistent cough.  Your symptoms do not improve with treatment.  You have a hoarse voice. SEEK IMMEDIATE MEDICAL CARE IF:  You have pain in your arms, neck, jaw, teeth, or back.  You feel sweaty, dizzy, or light-headed.  You have chest pain or  shortness of breath.  You vomit and your vomit looks like blood or coffee grounds.  You faint.  Your stool is bloody or black.  You cannot swallow, drink, or eat.   This information is not intended to replace advice given to you by your health care provider. Make sure you discuss any questions you have with your health care provider.   Document Released: 01/20/2005 Document Revised: 01/01/2015 Document Reviewed: 08/07/2014 Elsevier Interactive Patient Education 2016 [place] for Gastroesophageal Reflux Disease, Adult When you have gastroesophageal reflux disease (GERD), the foods you eat and your eating habits are very important. Choosing the right foods can help ease the discomfort of GERD. WHAT GENERAL GUIDELINES DO I NEED TO FOLLOW?  Choose fruits, vegetables, whole grains, low-fat dairy products, and low-fat meat, fish, and poultry.  Limit fats such as oils, salad dressings, butter, nuts, and avocado.  Keep a food diary to identify foods that cause symptoms.  Avoid foods that cause reflux. These may be different for different people.  Eat frequent small meals instead of three large meals each day.  Eat your meals slowly, in a relaxed setting.  Limit fried foods.  Cook foods using methods other than frying.  Avoid drinking alcohol.  Avoid drinking large amounts of liquids with your meals.  Avoid bending over or lying down until 2-3 hours after eating. WHAT FOODS ARE NOT RECOMMENDED? The following are some foods and drinks that may worsen your symptoms: Vegetables Tomatoes. Tomato juice. Tomato and spaghetti sauce. Chili peppers. Onion and garlic. Horseradish. Fruits Oranges, grapefruit, and lemon (fruit and juice). Meats High-fat meats, fish, and poultry. This includes hot dogs, ribs, ham, sausage, salami, and bacon. Dairy Whole milk and chocolate milk. Sour cream. Cream. Butter. Ice cream. Cream cheese.  Beverages Coffee and tea, with  or without caffeine. Carbonated beverages or energy drinks. Condiments Hot sauce. Barbecue sauce.  Sweets/Desserts Chocolate and cocoa. Donuts. Peppermint and spearmint. Fats and Oils High-fat foods, including [place] fries and potato chips. Other Vinegar. Strong spices, such as black pepper, white pepper, red pepper, cayenne, curry powder, cloves, ginger, and chili powder. The items listed above may not be a complete list of foods and beverages to avoid. Contact your dietitian for more information.   This information is not intended to replace advice given to you by your health care provider. Make sure you discuss any questions you have with your health care provider.   Document Released: 04/12/2005 Document Revised: 05/03/2014 Document Reviewed: 02/14/2013 Elsevier Interactive Patient Education [employer].

## 2016-02-13 ENCOUNTER — Encounter: Payer: Self-pay | Admitting: Gastroenterology

## 2016-02-18 ENCOUNTER — Encounter: Payer: Self-pay | Admitting: Gastroenterology

## 2016-03-11 ENCOUNTER — Encounter: Payer: Self-pay | Admitting: Gastroenterology

## 2016-03-16 ENCOUNTER — Other Ambulatory Visit: Payer: Self-pay | Admitting: Nurse Practitioner

## 2016-03-16 DIAGNOSIS — K589 Irritable bowel syndrome without diarrhea: Secondary | ICD-10-CM

## 2016-03-16 MED ORDER — DICYCLOMINE HCL 10 MG PO CAPS: 10.0000 mg | ORAL_CAPSULE | Freq: Three times a day (TID) | ORAL | 0 refills | Status: DC

## 2016-03-16 NOTE — Progress Notes (Signed)
Dicyclomine called to pharmacy per patient email request.

## 2016-05-12 ENCOUNTER — Ambulatory Visit: Payer: Self-pay | Admitting: Nurse Practitioner

## 2016-05-24 ENCOUNTER — Ambulatory Visit: Payer: Self-pay | Admitting: Gastroenterology

## 2016-05-24 ENCOUNTER — Encounter: Payer: Self-pay | Admitting: Gastroenterology

## 2016-05-24 ENCOUNTER — Ambulatory Visit (INDEPENDENT_AMBULATORY_CARE_PROVIDER_SITE_OTHER): Payer: PRIVATE HEALTH INSURANCE | Admitting: Gastroenterology

## 2016-05-24 VITALS — BP 181/87 | HR 84 | Temp 97.8°F | Ht 60.0 in | Wt 181.4 lb

## 2016-05-24 DIAGNOSIS — R1013 Epigastric pain: Secondary | ICD-10-CM | POA: Diagnosis not present

## 2016-05-24 MED ORDER — PANTOPRAZOLE SODIUM 40 MG PO TBEC: 40.0000 mg | DELAYED_RELEASE_TABLET | Freq: Every day | ORAL | 11 refills | Status: DC

## 2016-05-24 NOTE — Progress Notes (Signed)
      Primary Care Physician: Raiford Simmonds., PA-C  Primary Gastroenterologist:  Garfield Cornea, MD   Chief Complaint  Patient presents with  . Abdominal Pain    dull pain mid upper abd  . Gastroesophageal Reflux    better    HPI: [name] [name] is a 51 y.o. female here for follow-up. Last seen in October 2017. History of constipation, idiopathic gastroparesis, GERD. EGD 2016 for refractory GERD and esophageal dysphagia showed focal antral erosions, normal appearing esophagus status post empiric dilation, biopsies benign. Colonoscopy the same time unremarkable. Next colonoscopy July 2026.   One month pp epigastric pain. Has not been on PPI since 02/2016. Had no insurance and couldn't afford. Now she has insurance. symptoms worse with meat. No heartburn. Drinks mostly water, no more than one soft drink daily. No NSAIDs/ASA. Has taken Pepcid with little relief. Has lost 8 pounds, intentionally since 01/2016.      Current Outpatient Prescriptions  Medication Sig Dispense Refill  . acetaminophen (TYLENOL) 500 MG tablet Take 500 mg by mouth every 6 (six) hours as needed for mild pain, moderate pain or headache.     . famotidine (PEPCID) 20 MG tablet Take 1 tablet (20 mg total) by mouth 2 (two) times daily. 10 tablet 0  . levothyroxine (SYNTHROID, LEVOTHROID) 88 MCG tablet Take 88 mcg by mouth daily before breakfast.    . polyethylene glycol (MIRALAX / GLYCOLAX) packet Take 17 g by mouth daily.    . sodium chloride (OCEAN) 0.65 % SOLN nasal spray Place 1 spray into both nostrils as needed for congestion.     No current facility-administered medications for this visit.     Allergies as of 05/24/2016 - Review Complete 05/24/2016  Allergen Reaction Noted  . Codeine Nausea And Vomiting     ROS:  General: Negative for anorexia, unintentional weight loss, fever, chills, fatigue, weakness. ENT: Negative for hoarseness, difficulty swallowing , nasal congestion. CV: Negative for  chest pain, angina, palpitations, dyspnea on exertion, peripheral edema.  Respiratory: Negative for dyspnea at rest, dyspnea on exertion, cough, sputum, wheezing.  GI: See history of present illness. GU:  Negative for dysuria, hematuria, urinary incontinence, urinary frequency, nocturnal urination.  Endo: Negative for unusual weight change.    Physical Examination:   BP (!) 181/87   Pulse 84   Temp 97.8 F (36.6 C) (Oral)   Ht 5' (1.524 m)   Wt 181 lb 6.4 oz (82.3 kg)   BMI 35.43 kg/m   General: Well-nourished, well-developed in no acute distress.  Eyes: No icterus. Mouth: Oropharyngeal mucosa moist and pink , no lesions erythema or exudate. Lungs: Clear to auscultation bilaterally.  Heart: Regular rate and rhythm, no murmurs rubs or gallops.  Abdomen: Bowel sounds are normal, nontender, nondistended, no hepatosplenomegaly or masses, no abdominal bruits or hernia , no rebound or guarding.   Extremities: No lower extremity edema. No clubbing or deformities. Neuro: Alert and oriented x 4   Skin: Warm and dry, no jaundice.   Psych: Alert and cooperative, normal mood and affect.

## 2016-05-24 NOTE — Patient Instructions (Signed)
1. Begin pantoprazole 40mg  daily before breakfast. 2. Let me know in 2-3 weeks how you are doing.  If abdominal pain does not settle down, you may need further work up.    Gastritis, Adult Gastritis is soreness and puffiness (inflammation) of the lining of the stomach. If you do not get help, gastritis can cause bleeding and sores (ulcers) in the stomach. HOME CARE   Only take medicine as told by your doctor.  If you were given antibiotic medicines, take them as told. Finish the medicines even if you start to feel better.  Drink enough fluids to keep your pee (urine) clear or pale yellow.  Avoid foods and drinks that make your problems worse. Foods you may want to avoid include:  Caffeine or alcohol.  Chocolate.  Mint.  Garlic and onions.  Spicy foods.  Citrus fruits, including oranges, lemons, or limes.  Food containing tomatoes, including sauce, chili, salsa, and pizza.  Fried and fatty foods.  Eat small meals throughout the day instead of large meals. GET HELP RIGHT AWAY IF:   You have black or dark red poop (stools).  You throw up (vomit) blood. It may look like coffee grounds.  You cannot keep fluids down.  Your belly (abdominal) pain gets worse.  You have a fever.  You do not feel better after 1 week.  You have any other questions or concerns. MAKE SURE YOU:   Understand these instructions.  Will watch your condition.  Will get help right away if you are not doing well or get worse. This information is not intended to replace advice given to you by your health care provider. Make sure you discuss any questions you have with your health care provider. Document Released: 09/29/2007 Document Revised: 07/05/2011 Document Reviewed: 01/04/2015 Elsevier Interactive Patient Education  2017 [employer].

## 2016-05-24 NOTE — Progress Notes (Signed)
CC'D TO PCP °

## 2016-05-24 NOTE — Assessment & Plan Note (Addendum)
Postprandial epigastric discomfort especially with certain foods like meat. Worse over the past one month. Typical heartburn well-controlled with Pepcid. She has been off PPI therapy for couple of months due to lack of insurance. Initially will resume pantoprazole 40 mg daily before breakfast.Prescription provided. She will give me a progress report in a couple weeks to determine whether she will need any further workup. Discussed gastritis diet with patient and handout provided.  Congratulated her on intentional weight loss.

## 2016-05-26 ENCOUNTER — Ambulatory Visit: Payer: Self-pay | Admitting: Gastroenterology

## 2016-06-14 ENCOUNTER — Other Ambulatory Visit: Payer: Self-pay | Admitting: Adult Health

## 2016-06-21 ENCOUNTER — Encounter: Payer: Self-pay | Admitting: Gastroenterology

## 2016-06-23 ENCOUNTER — Telehealth: Payer: Self-pay | Admitting: Internal Medicine

## 2016-06-23 ENCOUNTER — Encounter: Payer: Self-pay | Admitting: Internal Medicine

## 2016-06-23 NOTE — Telephone Encounter (Signed)
OV made and letter mailed °

## 2016-06-23 NOTE — Telephone Encounter (Signed)
Pt called asking for LSL or the nurse. I told her both were with patients and I would transfer her call to [facility].

## 2016-06-23 NOTE — Telephone Encounter (Signed)
Per LSL note, pt needs to increase pantoprazole to bid and come back for ov in 3-4 weeks. I have sent her a message back in mychart.  Please schedule ov.

## 2016-06-23 NOTE — Telephone Encounter (Signed)
Please increase pantoprazole to twice daily before breakfast and evening meal and have patient come back in for office visit in 3-4 weeks.

## 2016-06-28 ENCOUNTER — Encounter: Payer: PRIVATE HEALTH INSURANCE | Admitting: Adult Health

## 2016-06-29 NOTE — Progress Notes (Signed)
This encounter was created in error - please disregard.

## 2016-07-12 ENCOUNTER — Encounter: Payer: Self-pay | Admitting: Gastroenterology

## 2016-07-12 ENCOUNTER — Ambulatory Visit (INDEPENDENT_AMBULATORY_CARE_PROVIDER_SITE_OTHER): Payer: PRIVATE HEALTH INSURANCE | Admitting: Gastroenterology

## 2016-07-12 VITALS — BP 157/91 | HR 78 | Temp 98.0°F | Ht 60.0 in | Wt 186.2 lb

## 2016-07-12 DIAGNOSIS — R1013 Epigastric pain: Secondary | ICD-10-CM

## 2016-07-12 MED ORDER — OMEPRAZOLE 40 MG PO CPDR: 40.0000 mg | DELAYED_RELEASE_CAPSULE | Freq: Every day | ORAL | 1 refills | Status: DC

## 2016-07-12 NOTE — Progress Notes (Signed)
CC'D TO PCP °

## 2016-07-12 NOTE — Assessment & Plan Note (Signed)
Ongoing intermittent but daily epigastric pain sometimes related to meals. No improvement with pantoprazole once daily. Recommended increase to bid but she didn't. At this time, given no alarm symptoms, we will switch her PPI to omeprazole 40mg  daily. She will contact us in 2 weeks, if no significant improvement, then plan for CMET, CBC, lipase and CT Abd with contrast.

## 2016-07-12 NOTE — Progress Notes (Signed)
      Primary Care Physician: Raiford Simmonds., PA-C  Primary Gastroenterologist:  Garfield Cornea, MD   Chief Complaint  Patient presents with  . Abdominal Pain    HPI: [name] [name] is a 51 y.o. female here for follow-up. Last seen in January. She has a history of constipation, idiopathic gastroparesis, GERD. EGD 2016 for refractory GERD and esophageal dysphagia showed focal antral erosions, normal appearing esophagus status post empiric dilation, biopsies benign. Colonoscopy at the same time unremarkable. Next colonoscopy due July 2026.  When I saw her last month she complained of postprandial epigastric pain for 4 weeks. She had been off her PPI since November. This was due to lapse in insurance. Heartburn was controlled on Pepcid. We resumed pantoprazole 40 mg daily. She sent a message a few weeks ago complaining of persistent pain at which time we increased her pantoprazole to twice a day. She tells me she did not increase it.   Epigastric pain intermittent. Not always related with meals. No vomiting. Little nausea. BM regular on Miralax. No melena, brbpr. No dysphagia. No nocturnal symptoms.   Current Outpatient Prescriptions  Medication Sig Dispense Refill  . acetaminophen (TYLENOL) 500 MG tablet Take 500 mg by mouth every 6 (six) hours as needed for mild pain, moderate pain or headache.     . famotidine (PEPCID) 20 MG tablet Take 1 tablet (20 mg total) by mouth 2 (two) times daily. 10 tablet 0  . levothyroxine (SYNTHROID, LEVOTHROID) 88 MCG tablet Take 88 mcg by mouth daily before breakfast.    . polyethylene glycol (MIRALAX / GLYCOLAX) packet Take 17 g by mouth daily.    . sodium chloride (OCEAN) 0.65 % SOLN nasal spray Place 1 spray into both nostrils as needed for congestion.    . Pantoprazole  40mg  daily     No current facility-administered medications for this visit.     Allergies as of 07/12/2016 - Review Complete 07/12/2016  Allergen Reaction Noted  . Codeine  Nausea And Vomiting     ROS:  General: Negative for anorexia, weight loss, fever, chills, fatigue, weakness. ENT: Negative for hoarseness, difficulty swallowing , nasal congestion. CV: Negative for chest pain, angina, palpitations, dyspnea on exertion, peripheral edema.  Respiratory: Negative for dyspnea at rest, dyspnea on exertion, cough, sputum, wheezing.  GI: See history of present illness. GU:  Negative for dysuria, hematuria, urinary incontinence, urinary frequency, nocturnal urination.  Endo: Negative for unusual weight change.    Physical Examination:   BP (!) 157/91   Pulse 78   Temp 98 F (36.7 C) (Oral)   Ht 5' (1.524 m)   Wt 186 lb 3.2 oz (84.5 kg)   BMI 36.36 kg/m   General: Well-nourished, well-developed in no acute distress.  Eyes: No icterus. Mouth: Oropharyngeal mucosa moist and pink , no lesions erythema or exudate. Lungs: Clear to auscultation bilaterally.  Heart: Regular rate and rhythm, no murmurs rubs or gallops.  Abdomen: Bowel sounds are normal, mild epig tenderness, nondistended, no hepatosplenomegaly or masses, no abdominal bruits or hernia , no rebound or guarding.   Extremities: No lower extremity edema. No clubbing or deformities. Neuro: Alert and oriented x 4   Skin: Warm and dry, no jaundice.   Psych: Alert and cooperative, normal mood and affect.

## 2016-07-12 NOTE — Patient Instructions (Addendum)
1. Stop pantoprazole. Start omeprazole 40 mg 30 minutes before breakfast daily. Prescription sent to your pharmacy. 2. Send me a message via MyChart in 2 weeks. If not a lot improved, then we will order labs and CT scan of your abdomen.

## 2016-07-20 ENCOUNTER — Ambulatory Visit: Payer: PRIVATE HEALTH INSURANCE | Admitting: Internal Medicine

## 2016-07-26 ENCOUNTER — Encounter: Payer: Self-pay | Admitting: Gastroenterology

## 2016-08-03 ENCOUNTER — Ambulatory Visit: Payer: PRIVATE HEALTH INSURANCE | Admitting: Internal Medicine

## 2016-08-31 ENCOUNTER — Encounter: Payer: Self-pay | Admitting: Gastroenterology

## 2016-09-01 ENCOUNTER — Other Ambulatory Visit: Payer: Self-pay | Admitting: Gastroenterology

## 2016-09-01 MED ORDER — ONDANSETRON 4 MG PO TBDP: 4.0000 mg | ORAL_TABLET | Freq: Four times a day (QID) | ORAL | 0 refills | Status: DC | PRN

## 2016-11-03 ENCOUNTER — Encounter: Payer: Self-pay | Admitting: Gastroenterology

## 2016-11-03 ENCOUNTER — Other Ambulatory Visit: Payer: Self-pay

## 2016-11-25 ENCOUNTER — Emergency Department (HOSPITAL_COMMUNITY)
Admission: EM | Admit: 2016-11-25 | Discharge: 2016-11-25 | Disposition: A | Discharge status: Home / Self Care | Payer: No Typology Code available for payment source | Attending: Emergency Medicine | Admitting: Emergency Medicine

## 2016-11-25 ENCOUNTER — Encounter (HOSPITAL_COMMUNITY): Payer: Self-pay | Admitting: Emergency Medicine

## 2016-11-25 DIAGNOSIS — E039 Hypothyroidism, unspecified: Secondary | ICD-10-CM | POA: Insufficient documentation

## 2016-11-25 DIAGNOSIS — S90821A Blister (nonthermal), right foot, initial encounter: Secondary | ICD-10-CM | POA: Diagnosis present

## 2016-11-25 DIAGNOSIS — W57XXXA Bitten or stung by nonvenomous insect and other nonvenomous arthropods, initial encounter: Secondary | ICD-10-CM | POA: Insufficient documentation

## 2016-11-25 DIAGNOSIS — Z79899 Other long term (current) drug therapy: Secondary | ICD-10-CM | POA: Diagnosis not present

## 2016-11-25 DIAGNOSIS — Y92832 Beach as the place of occurrence of the external cause: Secondary | ICD-10-CM | POA: Diagnosis not present

## 2016-11-25 DIAGNOSIS — Y999 Unspecified external cause status: Secondary | ICD-10-CM | POA: Diagnosis not present

## 2016-11-25 DIAGNOSIS — Y9301 Activity, walking, marching and hiking: Secondary | ICD-10-CM | POA: Diagnosis not present

## 2016-11-25 MED ORDER — CEPHALEXIN 500 MG PO CAPS: 500.0000 mg | ORAL_CAPSULE | Freq: Four times a day (QID) | ORAL | 0 refills | Status: DC

## 2016-11-25 NOTE — ED Triage Notes (Signed)
Pt noticed what appears 2 bite marks that are itchy on fourth toe to lt foot.  Denies any pain.  No redness or swelling

## 2016-11-25 NOTE — Discharge Instructions (Signed)
Take the entire course of the antibiotic as prescribed.  Soak your toe in warm salt water twice daily as discussed for 15 minutes.

## 2016-11-27 NOTE — ED Provider Notes (Signed)
[facility] DEPT Provider Note   CSN: [id number] Arrival date & time: 11/25/16  1157     History   Chief Complaint Chief Complaint  Patient presents with  . Insect Bite    HPI [name] [name] is a 51 y.o. female presenting with an itchy rash on the 4th toe of right left foot. She end[place] several days ago and felt a sting like sensation and since has developed itchy blisters on her toe.  She denies pain at the site.  She has no other complaint.  She has applied steroid cream without relief.  The history is provided by the patient.    Past Medical History:  Diagnosis Date  . Grave's disease    diagnosed years ago/ now hypothyroidism  . IBS (irritable colon syndrome)    constipation predominant  . Nondiabetic gastroparesis     Patient Active Problem List   Diagnosis Date Noted  . Abdominal pain, epigastric 02/09/2016  . Colon cancer screening   . Mucosal abnormality of stomach   . Dysphagia, pharyngoesophageal phase   . Esophageal dysphagia 10/15/2014  . Chronic fatigue 04/10/2014  . Encounter for screening colonoscopy 09/05/2013  . Chest wall pain 09/11/2012  . Hypothyroidism 11/18/2011  . Obesity, unspecified 11/18/2011  . GERD 12/05/2008  . GASTROPARESIS 04/03/2008  . CONSTIPATION, CHRONIC 04/03/2008    Past Surgical History:  Procedure Laterality Date  . ABDOMINAL HYSTERECTOMY    . attempted colonoscopy  03/2004   Dr. Rourk--> prep was poor. Exam to 40 cm. No gross abnormalities. Internal hemorrhoids. Air contrast barium enema was normal.  . CHOLECYSTECTOMY    . COLONOSCOPY N/A 10/30/2014   RMR: normal  . ESOPHAGEAL DILATION N/A 10/30/2014   Procedure: ESOPHAGEAL DILATION;  Surgeon: Daneil Dolin, MD;  Location: [facility];  Service: Endoscopy;  Laterality: N/A;  . ESOPHAGOGASTRODUODENOSCOPY  01/2004   Dr. Almyra Free small polyps in postbulbar area ablated  . ESOPHAGOGASTRODUODENOSCOPY N/A 10/30/2014   [id number]  . S/P Hysterectomy     . TONSILLECTOMY    . TUBAL LIGATION      OB History    Gravida Para Term Preterm AB Living   1 1 1     1    SAB TAB Ectopic Multiple Live Births                   Home Medications    Prior to Admission medications   Medication Sig Start Date End Date Taking? Authorizing Provider  hydrocortisone cream 1 % Apply 1 application topically daily. For itching   Yes [provider]  levothyroxine (SYNTHROID, LEVOTHROID) 88 MCG tablet Take 88 mcg by mouth daily before breakfast.   Yes [provider]  ondansetron (ZOFRAN ODT) 4 MG disintegrating tablet Take 1 tablet (4 mg total) by mouth every 6 (six) hours as needed for nausea or vomiting. 09/01/16  Yes Mahala Menghini, PA-C  polyethylene glycol (MIRALAX / GLYCOLAX) packet Take 17 g by mouth daily.   Yes [provider]  sodium chloride (OCEAN) 0.65 % SOLN nasal spray Place 1 spray into both nostrils as needed for congestion.   Yes [provider]  cephALEXin (KEFLEX) 500 MG capsule Take 1 capsule (500 mg total) by mouth 4 (four) times daily. 11/25/16   Evalee Jefferson, PA-C    Family History Family History  Problem Relation Age of Onset  . Other Father 61       hit by train  .  Heart disease Mother   . Kidney disease Mother   . Colon cancer Neg Hx     Social History Social History  Substance Use Topics  . Smoking status: Never Smoker  . Smokeless tobacco: Never Used  . Alcohol use Yes     Comment: occ     Allergies   Codeine   Review of Systems Review of Systems  Constitutional: Negative for chills and fever.  Respiratory: Negative for shortness of breath and wheezing.   Skin: Positive for rash.  Neurological: Negative for numbness.     Physical Exam Updated Vital Signs BP (!) 181/85   Pulse 83   Temp 99.1 F (37.3 C) (Oral)   Resp 20   Ht 5' (1.524 m)   Wt 82.6 kg (182 lb)   SpO2 98%   BMI 35.54 kg/m   Physical Exam  Constitutional: She  appears well-developed and well-nourished. No distress.  HENT:  Head: Normocephalic.  Neck: Neck supple.  Cardiovascular: Normal rate.   Pulmonary/Chest: Effort normal. She has no wheezes.  Musculoskeletal: Normal range of motion. She exhibits no edema.  Skin:  2 small pustules, intact on left dorsal 4th toe, approx 2 mm diameter.  No red streaking, no erythema or surrounding edema.  Distal sensation intact.     ED Treatments / Results  Labs (all labs ordered are listed, but only abnormal results are displayed) Labs Reviewed - No data to display  EKG  EKG Interpretation None       Radiology No results found.  Procedures Procedures (including critical care time)  INCISION AND DRAINAGE Performed by: Evalee Jefferson Consent: Verbal consent obtained. Risks and benefits: risks, benefits and alternatives were discussed Type: abscess  Body area: left 4th toe  Anesthesia: na  Needle aspiration using a #25 gauge needle.  Local anesthetic:none  Anesthetic total: none Complexity: simple Drainage: purulent  Drainage amount: small  Packing material: na Patient tolerance: Patient tolerated the procedure well with no immediate complications.     Medications Ordered in ED Medications - No data to display   Initial Impression / Assessment and Plan / ED Course  I have reviewed the triage vital signs and the nursing notes.  Pertinent labs & imaging results that were available during my care of the patient were reviewed by me and considered in my medical decision making (see chart for details).     Probable insect bite with inflammatory response, will cover for infectious process given purulent collection. Keflex, discussed warm epsom water soaks, keep covered and dry.  Recheck here or pcp for any worsening sx. Discussed bp and need for recheck by pcp within one week. Pt understands plan.  Final Clinical Impressions(s) / ED Diagnoses   Final diagnoses:  Bug bite with  infection, initial encounter    New Prescriptions Discharge Medication List as of 11/25/2016  2:09 PM    START taking these medications   Details  cephALEXin (KEFLEX) 500 MG capsule Take 1 capsule (500 mg total) by mouth 4 (four) times daily., Starting Thu 11/25/2016, Print         Evalee Jefferson, PA-C 11/27/16 [place], [place], DO 11/29/16 1404

## 2016-12-01 ENCOUNTER — Encounter (HOSPITAL_COMMUNITY): Payer: Self-pay | Admitting: Cardiology

## 2016-12-01 ENCOUNTER — Emergency Department (HOSPITAL_COMMUNITY)
Admission: EM | Admit: 2016-12-01 | Discharge: 2016-12-01 | Disposition: A | Discharge status: Home / Self Care | Payer: No Typology Code available for payment source | Attending: Emergency Medicine | Admitting: Emergency Medicine

## 2016-12-01 DIAGNOSIS — Z5321 Procedure and treatment not carried out due to patient leaving prior to being seen by health care provider: Secondary | ICD-10-CM | POA: Insufficient documentation

## 2016-12-01 DIAGNOSIS — J029 Acute pharyngitis, unspecified: Secondary | ICD-10-CM | POA: Insufficient documentation

## 2016-12-01 NOTE — ED Notes (Signed)
Pt left facility per registration.

## 2016-12-01 NOTE — ED Triage Notes (Signed)
C/o pain and swelling to throat, neck times one month.  States she thinks it is her thyroid

## 2016-12-15 ENCOUNTER — Other Ambulatory Visit (HOSPITAL_COMMUNITY): Payer: Self-pay | Admitting: *Deleted

## 2016-12-15 DIAGNOSIS — R1310 Dysphagia, unspecified: Secondary | ICD-10-CM

## 2016-12-15 DIAGNOSIS — M542 Cervicalgia: Secondary | ICD-10-CM

## 2016-12-21 ENCOUNTER — Ambulatory Visit (HOSPITAL_COMMUNITY)
Admission: RE | Admit: 2016-12-21 | Discharge: 2016-12-21 | Disposition: A | Discharge status: Home / Self Care | Payer: PRIVATE HEALTH INSURANCE | Source: Ambulatory Visit | Attending: *Deleted | Admitting: *Deleted

## 2016-12-21 DIAGNOSIS — M542 Cervicalgia: Secondary | ICD-10-CM | POA: Diagnosis not present

## 2016-12-21 DIAGNOSIS — R1310 Dysphagia, unspecified: Secondary | ICD-10-CM

## 2016-12-29 ENCOUNTER — Encounter: Payer: Self-pay | Admitting: Internal Medicine

## 2017-01-06 ENCOUNTER — Encounter: Payer: Self-pay | Admitting: Gastroenterology

## 2017-01-10 ENCOUNTER — Encounter: Payer: Self-pay | Admitting: Gastroenterology

## 2017-01-10 ENCOUNTER — Ambulatory Visit (INDEPENDENT_AMBULATORY_CARE_PROVIDER_SITE_OTHER): Payer: PRIVATE HEALTH INSURANCE | Admitting: Gastroenterology

## 2017-01-10 VITALS — BP 150/90 | HR 78 | Temp 97.8°F | Ht 60.0 in

## 2017-01-10 DIAGNOSIS — R1310 Dysphagia, unspecified: Secondary | ICD-10-CM

## 2017-01-10 DIAGNOSIS — R1319 Other dysphagia: Secondary | ICD-10-CM

## 2017-01-10 DIAGNOSIS — M542 Cervicalgia: Secondary | ICD-10-CM | POA: Insufficient documentation

## 2017-01-10 NOTE — Patient Instructions (Signed)
NPO for 3 hours before DG Esophagus was left off appt letter. Hand wrote NPO 3 hours before test when given to pt and she is aware.

## 2017-01-10 NOTE — Progress Notes (Signed)
Primary Care Physician:  Raiford Simmonds., PA-C  Primary Gastroenterologist:  Garfield Cornea, MD   Chief Complaint  Patient presents with  . Dysphagia    HPI:  [name] [name] is a 51 y.o. female here for further evaluation of dysphagia to solids/liquids, right side neck pain. Patient was last seen in March. She has a history of constipation, idiopathic gastroparesis, GERD.EGD 2016 for refractory GERD and esophageal dysphagia showed focal antral erosions, normal appearing esophagus status post empiric dilation, biopsies benign. Colonoscopy at the same time unremarkable. Next colonoscopy due July 2026.  She presents with complaints of intermittent right sided neck pain for past 3 years. Seems to be worse now. Wakes her up at night. Worse with and without swallowing. Feels like she has to swallow twice every time she eats or drinks to get things to go down. Pain in the right neck with swallowing. No heartburn. No vomiting. No abd pain. BM ok. No melena, brbpr.   PCP ordered Ultrasound of neck which showed unchanged moderately heterogeneous and diminutive thyroid gland without discrete nodule or mass. TSH 26.9 11/10/2016. Patient is on synthroid. She is unclear if dose changed after this lab. Papers from [facility] stated it was being followed by PCP.   H/O remote Graves. No cancer. Took medication for overactive thyroid but now on thyroid replacement meds.   Current Outpatient Prescriptions  Medication Sig Dispense Refill  . hydrocortisone cream 1 % Apply 1 application topically daily. For itching    . levothyroxine (SYNTHROID, LEVOTHROID) 88 MCG tablet Take 88 mcg by mouth daily before breakfast.    . ondansetron (ZOFRAN ODT) 4 MG disintegrating tablet Take 1 tablet (4 mg total) by mouth every 6 (six) hours as needed for nausea or vomiting. 30 tablet 0  . polyethylene glycol (MIRALAX / GLYCOLAX) packet Take 17 g by mouth daily.    . sodium chloride (OCEAN) 0.65 % SOLN nasal spray Place 1 spray  into both nostrils as needed for congestion.     No current facility-administered medications for this visit.     Allergies as of 01/10/2017 - Review Complete 01/10/2017  Allergen Reaction Noted  . Codeine Nausea And Vomiting     Past Medical History:  Diagnosis Date  . Grave's disease    diagnosed years ago/ now hypothyroidism  . IBS (irritable colon syndrome)    constipation predominant  . Nondiabetic gastroparesis     Past Surgical History:  Procedure Laterality Date  . ABDOMINAL HYSTERECTOMY    . attempted colonoscopy  03/2004   Dr. Rourk--> prep was poor. Exam to 40 cm. No gross abnormalities. Internal hemorrhoids. Air contrast barium enema was normal.  . CHOLECYSTECTOMY    . COLONOSCOPY N/A 10/30/2014   RMR: normal  . ESOPHAGEAL DILATION N/A 10/30/2014   Procedure: ESOPHAGEAL DILATION;  Surgeon: Daneil Dolin, MD;  Location: [facility];  Service: Endoscopy;  Laterality: N/A;  . ESOPHAGOGASTRODUODENOSCOPY  01/2004   Dr. Almyra Free small polyps in postbulbar area ablated  . ESOPHAGOGASTRODUODENOSCOPY N/A 10/30/2014   [id number]  . S/P Hysterectomy     . TONSILLECTOMY    . TUBAL LIGATION      Family History  Problem Relation Age of Onset  . Other Father 72       hit by train  . Heart disease Mother   . Kidney disease Mother   . Colon cancer Neg Hx     Social History   Social History  . Marital status: Single    Spouse  name: N/A  . Number of children: 1  . Years of education: N/A   Occupational History  . unemployed since July 3 [employer]   Social History Main Topics  . Smoking status: Never Smoker  . Smokeless tobacco: Never Used  . Alcohol use Yes     Comment: occ  . Drug use: No  . Sexual activity: Yes    Birth control/ protection: Surgical   Other Topics Concern  . Not on file   Social History Narrative   1 grown healthy daughter      ROS:  General: Negative for anorexia, weight loss, fever, chills, fatigue,  weakness. Eyes: Negative for vision changes.  ENT: Negative for hoarseness,  nasal congestion. See hpi. CV: Negative for chest pain, angina, palpitations, dyspnea on exertion, peripheral edema.  Respiratory: Negative for dyspnea at rest, dyspnea on exertion, cough, sputum, wheezing.  GI: See history of present illness. GU:  Negative for dysuria, hematuria, urinary incontinence, urinary frequency, nocturnal urination.  MS: Negative for joint pain, low back pain.  Derm: Negative for rash or itching.  Neuro: Negative for weakness, abnormal sensation, seizure, frequent headaches, memory loss, confusion.  Psych: Negative for anxiety, depression, suicidal ideation, hallucinations.  Endo: Negative for unusual weight change.  Heme: Negative for bruising or bleeding. Allergy: Negative for rash or hives.    Physical Examination:  BP (!) 150/90   Pulse 78   Temp 97.8 F (36.6 C) (Oral)   Ht 5' (1.524 m)    General: Well-nourished, well-developed in no acute distress.  Head: Normocephalic, atraumatic.   Eyes: Conjunctiva pink, no icterus. Mouth: Oropharyngeal mucosa moist and pink , no lesions erythema or exudate. Neck: Supple without thyromegaly, masses, or lymphadenopathy. Tender with palpation of right neck Lungs: Clear to auscultation bilaterally.  Heart: Regular rate and rhythm, no murmurs rubs or gallops.  Abdomen: Bowel sounds are normal, nontender, nondistended, no hepatosplenomegaly or masses, no abdominal bruits or    hernia , no rebound or guarding.   Rectal: not performed Extremities: No lower extremity edema. No clubbing or deformities.  Neuro: Alert and oriented x 4 , grossly normal neurologically.  Skin: Warm and dry, no rash or jaundice.   Psych: Alert and cooperative, normal mood and affect.   Imaging Studies: US Soft Tissue Head And Neck  Result Date: 12/21/2016 CLINICAL DATA:  Hypothyroid. Right-sided neck pain for the past 3 years. Dysphagia. EXAM: THYROID ULTRASOUND  TECHNIQUE: Ultrasound examination of the thyroid gland and adjacent soft tissues was performed. COMPARISON:  03/05/2015; 07/24/2012 FINDINGS: Parenchymal Echotexture: Moderately heterogenous Isthmus: Normal in size measures 0.2 cm in diameter Right lobe: Diminutive in size measuring 2.3 x 0.7 x 0.5 cm, unchanged, previously, 2.4 x 0.9 x 0.4 cm Left lobe: Diminutive in size measuring 2.4 x 0.8 x 0.7 cm, unchanged, previously, 2.3 x 0.8 x 0.6 cm _________________________________________________________ Estimated total number of nodules >/= 1 cm: 0 Number of spongiform nodules >/=  2 cm not described below (TR1): 0 Number of mixed cystic and solid nodules >/= 1.5 cm not described below (TR2): 0 _________________________________________________________ No discrete nodules are seen within the thyroid gland. Note is made of a benign appearing non pathologically enlarged cervical lymph node inferior to the left lobe of the thyroid which measures approximately 0.5 cm in greatest short axis diameter (image 46) and is unchanged compared to the 02/2015 examination. IMPRESSION: Unchanged moderately heterogeneous and diminutive thyroid gland without discrete nodule or mass. Electronically Signed   By: Eldridge Abrahams.D.  On: 12/21/2016 12:57

## 2017-01-10 NOTE — Patient Instructions (Signed)
1. Barium esophagram. We will contact you with results as available.

## 2017-01-10 NOTE — Assessment & Plan Note (Signed)
Three year h/o intermittent right neck pain and odynophagia/dysphagia. Symptoms worsening. Neck pain wakes up at night time. Pain with palpation makes less likely an esophageal problem. EGD 2016 with normal esophagus. EGD likely low yield at this time. Will plan on BPE. If normal, would consider further neck imaging via CT versus ENT evaluation. Cannot rule out musculoskeletal source at this time. Further recommendations to follow.

## 2017-01-10 NOTE — Progress Notes (Signed)
CC'D TO PCP °

## 2017-01-17 ENCOUNTER — Ambulatory Visit (HOSPITAL_COMMUNITY)
Admission: RE | Admit: 2017-01-17 | Discharge: 2017-01-17 | Disposition: A | Discharge status: Home / Self Care | Payer: PRIVATE HEALTH INSURANCE | Source: Ambulatory Visit | Attending: Gastroenterology | Admitting: Gastroenterology

## 2017-01-17 DIAGNOSIS — R1319 Other dysphagia: Secondary | ICD-10-CM

## 2017-01-17 DIAGNOSIS — R1310 Dysphagia, unspecified: Secondary | ICD-10-CM | POA: Insufficient documentation

## 2017-01-31 NOTE — Progress Notes (Signed)
Her esophagram was unremarkable. Her TSH was over 20 in 10/2016. Has she had it rechecked? If not, let's get TSH now.  If she is still having pain in her right neck that is waking her up from sleep, and pain during the day, only thing else I have to offer is seeing ENT or doing CT neck. Ask her what she would like to do.

## 2017-02-03 ENCOUNTER — Other Ambulatory Visit: Payer: Self-pay

## 2017-02-07 ENCOUNTER — Other Ambulatory Visit: Payer: Self-pay

## 2017-02-08 ENCOUNTER — Other Ambulatory Visit: Payer: Self-pay

## 2017-02-08 DIAGNOSIS — M542 Cervicalgia: Secondary | ICD-10-CM

## 2017-02-08 DIAGNOSIS — R1310 Dysphagia, unspecified: Secondary | ICD-10-CM

## 2017-02-17 ENCOUNTER — Ambulatory Visit (HOSPITAL_COMMUNITY): Payer: PRIVATE HEALTH INSURANCE

## 2017-02-18 ENCOUNTER — Encounter: Payer: Self-pay | Admitting: Gastroenterology

## 2017-02-21 ENCOUNTER — Ambulatory Visit (HOSPITAL_COMMUNITY)
Admission: RE | Admit: 2017-02-21 | Discharge: 2017-02-21 | Disposition: A | Discharge status: Home / Self Care | Payer: PRIVATE HEALTH INSURANCE | Source: Ambulatory Visit | Attending: Gastroenterology | Admitting: Gastroenterology

## 2017-02-21 DIAGNOSIS — M542 Cervicalgia: Secondary | ICD-10-CM | POA: Diagnosis present

## 2017-02-21 DIAGNOSIS — R1310 Dysphagia, unspecified: Secondary | ICD-10-CM

## 2017-02-21 DIAGNOSIS — I700 Atherosclerosis of aorta: Secondary | ICD-10-CM | POA: Diagnosis not present

## 2017-02-21 DIAGNOSIS — M5030 Other cervical disc degeneration, unspecified cervical region: Secondary | ICD-10-CM | POA: Diagnosis not present

## 2017-02-21 DIAGNOSIS — M799 Soft tissue disorder, unspecified: Secondary | ICD-10-CM | POA: Diagnosis not present

## 2017-02-21 MED ORDER — IOPAMIDOL (ISOVUE-300) INJECTION 61%
75.0000 mL | Freq: Once | INTRAVENOUS | Status: AC | PRN
  Administered 2017-02-21: 75 mL via INTRAVENOUS

## 2017-02-28 ENCOUNTER — Other Ambulatory Visit: Payer: Self-pay | Admitting: *Deleted

## 2017-02-28 DIAGNOSIS — R1310 Dysphagia, unspecified: Secondary | ICD-10-CM

## 2017-02-28 DIAGNOSIS — M542 Cervicalgia: Secondary | ICD-10-CM

## 2017-02-28 NOTE — Progress Notes (Signed)
Please let patient know she needs to see ENT, we have Dr. Benjamine Mola locally if she prefers.  Neck CT showed mildly prominent asymmetric soft tissue in the posterior hypopharynx on the left and needs direct visualization by ENT. This is on the oppsoite side from where she is feeling her neck pain but needs to be evaluated.

## 2017-03-20 ENCOUNTER — Encounter: Payer: Self-pay | Admitting: Gastroenterology

## 2017-03-22 ENCOUNTER — Encounter: Payer: Self-pay | Admitting: Gastroenterology

## 2017-03-23 ENCOUNTER — Encounter: Payer: Self-pay | Admitting: Gastroenterology

## 2017-04-08 ENCOUNTER — Encounter: Payer: Self-pay | Admitting: Gastroenterology

## 2017-05-16 ENCOUNTER — Ambulatory Visit (INDEPENDENT_AMBULATORY_CARE_PROVIDER_SITE_OTHER): Payer: PRIVATE HEALTH INSURANCE | Admitting: Otolaryngology

## 2017-05-23 ENCOUNTER — Encounter: Payer: Self-pay | Admitting: Gastroenterology

## 2017-05-24 ENCOUNTER — Other Ambulatory Visit: Payer: Self-pay | Admitting: Gastroenterology

## 2017-05-24 MED ORDER — ONDANSETRON 4 MG PO TBDP: 4.0000 mg | ORAL_TABLET | Freq: Four times a day (QID) | ORAL | 0 refills | Status: DC | PRN

## 2017-05-31 NOTE — Telephone Encounter (Signed)
Pt has ov scheduled.

## 2017-06-14 ENCOUNTER — Encounter: Payer: Self-pay | Admitting: Gastroenterology

## 2017-06-14 ENCOUNTER — Ambulatory Visit (INDEPENDENT_AMBULATORY_CARE_PROVIDER_SITE_OTHER): Payer: PRIVATE HEALTH INSURANCE | Admitting: Gastroenterology

## 2017-06-14 VITALS — BP 128/72 | HR 83 | Temp 97.3°F | Ht 60.0 in | Wt 187.8 lb

## 2017-06-14 DIAGNOSIS — R9389 Abnormal findings on diagnostic imaging of other specified body structures: Secondary | ICD-10-CM | POA: Diagnosis not present

## 2017-06-14 DIAGNOSIS — K3184 Gastroparesis: Secondary | ICD-10-CM | POA: Diagnosis not present

## 2017-06-14 MED ORDER — ONDANSETRON 4 MG PO TBDP: 4.0000 mg | ORAL_TABLET | Freq: Three times a day (TID) | ORAL | 3 refills | Status: DC

## 2017-06-14 NOTE — Patient Instructions (Addendum)
1. Please follow up on your referral to the ENT, Dr. Benjamine Mola. You had abnormal neck CT scan and need to see the specialist. His phone number is [phone]. [address] in [place], [place]. 2. Take zofran 4mg  30 minutes before each meal (three times a day) and you can take one at bedtime if having nausea. Let me know how you feel in next couple of weeks. Also if you note constipation on Zofran, let me know.  3. I will review your [facility].    Gastroparesis Gastroparesis, also called delayed gastric emptying, is a condition in which food takes longer than normal to empty from the stomach. The condition is usually long-lasting (chronic). What are the causes? This condition may be caused by:  An endocrine disorder, such as hypothyroidism or diabetes. Diabetes is the most common cause of this condition.  A nervous system disease, such as Parkinson disease or multiple sclerosis.  Cancer, infection, or surgery of the stomach or vagus nerve.  A connective tissue disorder, such as scleroderma.  Certain medicines.  In most cases, the cause is not known. What increases the risk? This condition is more likely to develop in:  People with certain disorders, including endocrine disorders, eating disorders, amyloidosis, and scleroderma.  People with certain diseases, including Parkinson disease or multiple sclerosis.  People with cancer or infection of the stomach or vagus nerve.  People who have had surgery on the stomach or vagus nerve.  People who take certain medicines.  Women.  What are the signs or symptoms? Symptoms of this condition include:  An early feeling of fullness when eating.  Nausea.  Weight loss.  Vomiting.  Heartburn.  Abdominal bloating.  Inconsistent blood glucose levels.  Lack of appetite.  Acid from the stomach coming up into the esophagus (gastroesophageal reflux).  Spasms of the stomach.  Symptoms may come and  go. How is this diagnosed? This condition is diagnosed with tests, such as:  Tests that check how long it takes food to move through the stomach and intestines. These tests include: ? Upper gastrointestinal (GI) series. In this test, X-rays of the intestines are taken after you drink a liquid. The liquid makes the intestines show up better on the X-rays. ? Gastric emptying scintigraphy. In this test, scans are taken after you eat food that contains a small amount of radioactive material. ? Wireless capsule GI monitoring system. This test involves swallowing a capsule that records information about movement through the stomach.  Gastric manometry. This test measures electrical and muscular activity in the stomach. It is done with a thin tube that is passed down the throat and into the stomach.  Endoscopy. This test checks for abnormalities in the lining of the stomach. It is done with a long, thin tube that is passed down the throat and into the stomach.  An ultrasound. This test can help rule out gallbladder disease or pancreatitis as a cause of your symptoms. It uses sound waves to take pictures of the inside of your body.  How is this treated? There is no cure for gastroparesis. This condition may be managed with:  Treatment of the underlying condition causing the gastroparesis.  Lifestyle changes, including exercise and dietary changes. Dietary changes can include: ? Changes in what and when you eat. ? Eating smaller meals more often. ? Eating low-fat foods. ? Eating low-fiber forms of high-fiber foods, such as cooked vegetables instead of raw vegetables. ? Having liquid foods in place  of solid foods. Liquid foods are easier to digest.  Medicines. These may be given to control nausea and vomiting and to stimulate stomach muscles.  Getting food through a feeding tube. This may be done in severe cases.  A gastric neurostimulator. This is a device that is inserted into the body with  surgery. It helps improve stomach emptying and control nausea and vomiting.  Follow these instructions at home:  Follow your health care provider's instructions about exercise and diet.  Take medicines only as directed by your health care provider. Contact a health care provider if:  Your symptoms do not improve with treatment.  You have new symptoms. Get help right away if:  You have severe abdominal pain that does not improve with treatment.  You have nausea that does not go away.  You cannot keep fluids down. This information is not intended to replace advice given to you by your health care provider. Make sure you discuss any questions you have with your health care provider. Document Released: 04/12/2005 Document Revised: 09/18/2015 Document Reviewed: 04/08/2014 Elsevier Interactive Patient Education  [employer].

## 2017-06-14 NOTE — Progress Notes (Signed)
cc'ed to pcp °

## 2017-06-14 NOTE — Assessment & Plan Note (Signed)
Complains of more frequent nausea but no vomiting. Occurring every day. No weight loss. Discussed gastroparesis diet and handout provided. Try zofran before meals up to three times daily and at bedtime. Call with progress report in couple of weeks. Retrieve copy of thyroid labs done recently.

## 2017-06-14 NOTE — Assessment & Plan Note (Signed)
Patient has been advised multiple times of abnormal neck CT and need for ENT evaluation. Most recent appointment with Dr. Benjamine Mola cancelled by patient on 05/16/17. Phone numbers provided and requested patient to reschedule appt. Explained abnormal neck CT findings and need for direct visualization by ENT specialist of the area of concern on CT. Patient voiced understanding.

## 2017-06-14 NOTE — Progress Notes (Signed)
Primary Care Physician: Raiford Simmonds., PA-C  Primary Gastroenterologist:  Garfield Cornea, MD   Chief Complaint  Patient presents with  . Nausea    comes/goes, feels she needs zofran again 1 hr after taking it    HPI: [name] [name] is a 52 y.o. female here for further evaluation of nausea. She has a history of constipation, idiopathic gastroparesis, GERD.EGD 2016 for refractory GERD and esophageal dysphagia showed focal antral erosions, normal appearing esophagus status post empiricdilation, biopsies benign. Colonoscopy at the same time unremarkable. Next colonoscopy due July 2026.  Patient last seen in 12/2016. Mostly concerned about dysphagia and right sided neck pain. She had unremarkable barium esophagram. CT neck showed mildly prominent in asymmetric soft tissue in posterior hypopharynx on the left recommended direct inspection by ENT. Also with mild prominence of palatine tonsils and adenoid tissue without discrete lesion. We told patient she needed to see ENT. We sent referral two different times. She had appointment in January and cancelled it. She states she also missed due to the weather.   Weight is up five pounds since 11/2016. Complains of several months of more frequent nausea but no vomiting. She requested antiemetic few weeks ago. zofran provided  Wakes up feeling fine every morning. She does not eat until lunch. She will take zofran prior to lunch. Usually goes out for lunch, often has chicken pot pie from [facility]. Consumes about half of it. Feels fine at the time but usually within an hour afterwards she starts feeling nauseated. Then she'll take a second Zofran which provides limited relief. Around 330 afternoon she feels nauseated again. She typically tries to eat supper at home, sometimes she is unable to eat. When she goes to bed she feels nauseated as well. Denies any heartburn, no dysphagia, no abdominal pain or bloating. Bowel movements are regular. No blood in  the stool or melena.  She reports having thyroid labs done health department recently with no dose adjustment of her Synthroid.    Current Outpatient Medications  Medication Sig Dispense Refill  . hydrocortisone cream 1 % Apply 1 application topically as needed. For itching     . levothyroxine (SYNTHROID, LEVOTHROID) 88 MCG tablet Take 88 mcg by mouth daily before breakfast.    . ondansetron (ZOFRAN ODT) 4 MG disintegrating tablet Take 1 tablet (4 mg total) by mouth every 6 (six) hours as needed for nausea or vomiting. 30 tablet 0  . polyethylene glycol (MIRALAX / GLYCOLAX) packet Take 17 g by mouth daily.    . sodium chloride (OCEAN) 0.65 % SOLN nasal spray Place 1 spray into both nostrils as needed for congestion.     No current facility-administered medications for this visit.     Allergies as of 06/14/2017 - Review Complete 06/14/2017  Allergen Reaction Noted  . Codeine Nausea And Vomiting     ROS:  General: Negative for anorexia, weight loss, fever, chills, fatigue, weakness. ENT: Negative for hoarseness, difficulty swallowing , nasal congestion. CV: Negative for chest pain, angina, palpitations, dyspnea on exertion, peripheral edema.  Respiratory: Negative for dyspnea at rest, dyspnea on exertion, cough, sputum, wheezing.  GI: See history of present illness. GU:  Negative for dysuria, hematuria, urinary incontinence, urinary frequency, nocturnal urination.  Endo: Negative for unusual weight change.    Physical Examination:   BP 128/72   Pulse 83   Temp (!) 97.3 F (36.3 C) (Oral)   Ht 5' (1.524 m)   Wt 187 lb 12.8 oz (  85.2 kg)   BMI 36.68 kg/m   General: Well-nourished, well-developed in no acute distress.  Eyes: No icterus. Mouth: Oropharyngeal mucosa moist and pink , no lesions erythema or exudate. Lungs: Clear to auscultation bilaterally.  Heart: Regular rate and rhythm, no murmurs rubs or gallops.  Abdomen: Bowel sounds are normal, nontender, nondistended, no  hepatosplenomegaly or masses, no abdominal bruits or hernia , no rebound or guarding.   Extremities: No lower extremity edema. No clubbing or deformities. Neuro: Alert and oriented x 4   Skin: Warm and dry, no jaundice.   Psych: Alert and cooperative, normal mood and affect.

## 2017-07-06 ENCOUNTER — Encounter: Payer: Self-pay | Admitting: Gastroenterology

## 2017-07-07 ENCOUNTER — Encounter: Payer: Self-pay | Admitting: Gastroenterology

## 2017-07-18 ENCOUNTER — Ambulatory Visit: Payer: PRIVATE HEALTH INSURANCE | Admitting: Gastroenterology

## 2017-07-26 ENCOUNTER — Encounter: Payer: Self-pay | Admitting: Gastroenterology

## 2017-08-03 ENCOUNTER — Encounter: Payer: Self-pay | Admitting: Gastroenterology

## 2017-08-03 NOTE — Telephone Encounter (Signed)
Received a call from pt, stating that she is only dispensed 30 pills of Zofran at a time. Spoke to pharmacy this morning in reference to pt only getting 30 pills at a time, pharmacy said its due to the cost being $87.00 for 120 pills. Found a coupon at [employer]rx for $38.00 for 120 pills. Coupon cards are ready for pickup. Pt is aware and is ok with that price.

## 2017-08-05 NOTE — Telephone Encounter (Signed)
Please schedule OV with RMR only, (chronic nausea, may need EGD).   Please schedule GES now.

## 2017-08-08 ENCOUNTER — Encounter: Payer: Self-pay | Admitting: Internal Medicine

## 2017-08-08 ENCOUNTER — Other Ambulatory Visit: Payer: Self-pay

## 2017-08-08 DIAGNOSIS — R110 Nausea: Secondary | ICD-10-CM

## 2017-08-08 NOTE — Telephone Encounter (Signed)
GES scheduled for 08/16/17 at 8:00am, arrive at 7:45am. NPO, no Zofran or stomach medications after midnight prior to test. Called and informed pt of appt. She is aware GES could take up to 4 1/2 hours. Letter mailed.

## 2017-08-08 NOTE — Telephone Encounter (Signed)
PATIENT SCHEDULED AND LETTER SENT  °

## 2017-08-16 ENCOUNTER — Encounter (HOSPITAL_COMMUNITY): Payer: Self-pay

## 2017-08-16 ENCOUNTER — Encounter (HOSPITAL_COMMUNITY)
Admission: RE | Admit: 2017-08-16 | Discharge: 2017-08-16 | Disposition: A | Discharge status: Home / Self Care | Payer: No Typology Code available for payment source | Source: Ambulatory Visit | Attending: Gastroenterology | Admitting: Gastroenterology

## 2017-08-16 DIAGNOSIS — R110 Nausea: Secondary | ICD-10-CM | POA: Diagnosis present

## 2017-08-16 HISTORY — DX: Essential (primary) hypertension: I10

## 2017-08-16 MED ORDER — TECHNETIUM TC 99M SULFUR COLLOID
2.0000 | Freq: Once | INTRAVENOUS | Status: AC | PRN
  Administered 2017-08-16: 2.1 via ORAL

## 2017-08-26 ENCOUNTER — Telehealth: Payer: Self-pay | Admitting: Gastroenterology

## 2017-08-26 NOTE — Telephone Encounter (Signed)
Received labs from [facility] dated 04/2017: TSH 0.58, tbili 0.7, AP 83, AST 19, ALT 19, alb 4.3. Records to be scanned.

## 2017-10-10 ENCOUNTER — Ambulatory Visit (INDEPENDENT_AMBULATORY_CARE_PROVIDER_SITE_OTHER): Payer: PRIVATE HEALTH INSURANCE | Admitting: Adult Health

## 2017-10-10 ENCOUNTER — Encounter: Payer: Self-pay | Admitting: Adult Health

## 2017-10-10 VITALS — BP 132/71 | HR 63 | Ht 60.0 in | Wt 185.0 lb

## 2017-10-10 DIAGNOSIS — R232 Flushing: Secondary | ICD-10-CM

## 2017-10-10 DIAGNOSIS — N898 Other specified noninflammatory disorders of vagina: Secondary | ICD-10-CM

## 2017-10-10 DIAGNOSIS — Z1211 Encounter for screening for malignant neoplasm of colon: Secondary | ICD-10-CM | POA: Diagnosis not present

## 2017-10-10 DIAGNOSIS — Z1212 Encounter for screening for malignant neoplasm of rectum: Secondary | ICD-10-CM | POA: Diagnosis not present

## 2017-10-10 DIAGNOSIS — Z01419 Encounter for gynecological examination (general) (routine) without abnormal findings: Secondary | ICD-10-CM | POA: Diagnosis not present

## 2017-10-10 DIAGNOSIS — N8111 Cystocele, midline: Secondary | ICD-10-CM | POA: Diagnosis not present

## 2017-10-10 DIAGNOSIS — Z7189 Other specified counseling: Secondary | ICD-10-CM

## 2017-10-10 LAB — HEMOCCULT GUIAC POC 1CARD (OFFICE): FECAL OCCULT BLD: NEGATIVE

## 2017-10-10 MED ORDER — ESTRADIOL 1 MG PO TABS: 1.0000 mg | ORAL_TABLET | Freq: Every day | ORAL | 6 refills | Status: DC

## 2017-10-10 NOTE — Patient Instructions (Signed)
Menopause and Hormone Replacement Therapy What is hormone replacement therapy? Hormone replacement therapy (HRT) is the use of artificial (synthetic) hormones to replace hormones that your body stops producing during menopause. Menopause is the normal time of life when menstrual periods stop completely and the ovaries stop producing the female hormones estrogen and progesterone. This lack of hormones can affect your health and cause undesirable symptoms. HRT can relieve some of those symptoms. What are my options for HRT? HRT may consist of the synthetic hormones estrogen and progestin, or it may consist of only estrogen (estrogen-only therapy). You and your health care provider will decide which form of HRT is best for you. If you choose to be on HRT and you have a uterus, estrogen and progestin are usually prescribed. Estrogen-only therapy is used for women who do not have a uterus. Possible options for taking HRT include:  Pills.  Patches.  Gels.  Sprays.  Vaginal cream.  Vaginal rings.  Vaginal inserts.  The amount of hormone(s) that you take and how long you take the hormone(s) varies depending on your individual health. It is important to:  Begin HRT with the lowest possible dosage.  Stop HRT as soon as your health care provider tells you to stop.  Work with your health care provider so that you feel informed and comfortable with your decisions.  What are the benefits of HRT? HRT can reduce the frequency and severity of menopausal symptoms. Benefits of HRT vary depending on the menopausal symptoms that you have, the severity of your symptoms, and your overall health. HRT may help to improve the following menopausal symptoms:  Hot flashes and night sweats. These are sudden feelings of heat that spread over the face and body. The skin may turn red, like a blush. Night sweats are hot flashes that happen while you are sleeping or trying to sleep.  Bone loss (osteoporosis). The  body loses calcium more quickly after menopause, causing the bones to become weaker. This can increase the risk for bone breaks (fractures).  Vaginal dryness. The lining of the vagina can become thin and dry, which can cause pain during sexual intercourse or cause infection, burning, or itching.  Urinary tract infections.  Urinary incontinence. This is a decreased ability to control when you urinate.  Irritability.  Short-term memory problems.  What are the risks of HRT? Risks of HRT vary depending on your individual health and medical history. Risks of HRT also depend on whether you receive both estrogen and progestin or you receive estrogen only.HRT may increase the risk of:  Spotting. This is when a small amount of bloodleaks from the vagina unexpectedly.  Endometrial cancer. This cancer is in the lining of the uterus (endometrium).  Breast cancer.  Increased density of breast tissue. This can make it harder to find breast cancer on a breast X-ray (mammogram).  Stroke.  Heart attack.  Blood clots.  Gallbladder disease.  Risks of HRT can increase if you have any of the following conditions:  Endometrial cancer.  Liver disease.  Heart disease.  Breast cancer.  History of blood clots.  History of stroke.  How should I care for myself while I am on HRT?  Take over-the-counter and prescription medicines only as told by your health care provider.  Get mammograms, pelvic exams, and medical checkups as often as told by your health care provider.  Have Pap tests done as often as told by your health care provider. A Pap test is sometimes called a Pap   smear. It is a screening test that is used to check for signs of cancer of the cervix and vagina. A Pap test can also identify the presence of infection or precancerous changes. Pap tests may be done: ? Every 3 years, starting at age 21. ? Every 5 years, starting after age 30, in combination with testing for human  papillomavirus (HPV). ? More often or less often depending on other medical conditions you have, your age, and other risk factors.  It is your responsibility to get your Pap test results. Ask your health care provider or the department performing the test when your results will be ready.  Keep all follow-up visits as told by your health care provider. This is important. When should I seek medical care? Talk with your health care provider if:  You have any of these: ? Pain or swelling in your legs. ? Shortness of breath. ? Chest pain. ? Lumps or changes in your breasts or armpits. ? Slurred speech. ? Pain, burning, or bleeding when you urine.  You develop any of these: ? Unusual vaginal bleeding. ? Dizziness or headaches. ? Weakness or numbness in any part of your arms or legs. ? Pain in your abdomen.  This information is not intended to replace advice given to you by your health care provider. Make sure you discuss any questions you have with your health care provider. Document Released: 01/09/2003 Document Revised: 03/09/2016 Document Reviewed: 10/14/2014 Elsevier Interactive Patient Education  2017 Elsevier Inc. Menopause Menopause is the normal time of life when menstrual periods stop completely. Menopause is complete when you have missed 12 consecutive menstrual periods. It usually occurs between the ages of 48 years and 55 years. Very rarely does a woman develop menopause before the age of 40 years. At menopause, your ovaries stop producing the female hormones estrogen and progesterone. This can cause undesirable symptoms and also affect your health. Sometimes the symptoms may occur 4-5 years before the menopause begins. There is no relationship between menopause and:  Oral contraceptives.  Number of children you had.  Race.  The age your menstrual periods started (menarche).  Heavy smokers and very thin women may develop menopause earlier in life. What are the  causes?  The ovaries stop producing the female hormones estrogen and progesterone. Other causes include:  Surgery to remove both ovaries.  The ovaries stop functioning for no known reason.  Tumors of the pituitary gland in the brain.  Medical disease that affects the ovaries and hormone production.  Radiation treatment to the abdomen or pelvis.  Chemotherapy that affects the ovaries.  What are the signs or symptoms?  Hot flashes.  Night sweats.  Decrease in sex drive.  Vaginal dryness and thinning of the vagina causing painful intercourse.  Dryness of the skin and developing wrinkles.  Headaches.  Tiredness.  Irritability.  Memory problems.  Weight gain.  Bladder infections.  Hair growth of the face and chest.  Infertility. More serious symptoms include:  Loss of bone (osteoporosis) causing breaks (fractures).  Depression.  Hardening and narrowing of the arteries (atherosclerosis) causing heart attacks and strokes.  How is this diagnosed?  When the menstrual periods have stopped for 12 straight months.  Physical exam.  Hormone studies of the blood. How is this treated? There are many treatment choices and nearly as many questions about them. The decisions to treat or not to treat menopausal changes is an individual choice made with your health care provider. Your health care provider can discuss   the treatments with you. Together, you can decide which treatment will work best for you. Your treatment choices may include:  Hormone therapy (estrogen and progesterone).  Non-hormonal medicines.  Treating the individual symptoms with medicine (for example antidepressants for depression).  Herbal medicines that may help specific symptoms.  Counseling by a psychiatrist or psychologist.  Group therapy.  Lifestyle changes including: ? Eating healthy. ? Regular exercise. ? Limiting caffeine and alcohol. ? Stress management and meditation.  No  treatment.  Follow these instructions at home:  Take the medicine your health care provider gives you as directed.  Get plenty of sleep and rest.  Exercise regularly.  Eat a diet that contains calcium (good for the bones) and soy products (acts like estrogen hormone).  Avoid alcoholic beverages.  Do not smoke.  If you have hot flashes, dress in layers.  Take supplements, calcium, and vitamin D to strengthen bones.  You can use over-the-counter lubricants or moisturizers for vaginal dryness.  Group therapy is sometimes very helpful.  Acupuncture may be helpful in some cases. Contact a health care provider if:  You are not sure you are in menopause.  You are having menopausal symptoms and need advice and treatment.  You are still having menstrual periods after age 55 years.  You have pain with intercourse.  Menopause is complete (no menstrual period for 12 months) and you develop vaginal bleeding.  You need a referral to a specialist (gynecologist, psychiatrist, or psychologist) for treatment. Get help right away if:  You have severe depression.  You have excessive vaginal bleeding.  You fell and think you have a broken bone.  You have pain when you urinate.  You develop leg or chest pain.  You have a fast pounding heart beat (palpitations).  You have severe headaches.  You develop vision problems.  You feel a lump in your breast.  You have abdominal pain or severe indigestion. This information is not intended to replace advice given to you by your health care provider. Make sure you discuss any questions you have with your health care provider. Document Released: 07/03/2003 Document Revised: 09/18/2015 Document Reviewed: 11/09/2012 Elsevier Interactive Patient Education  2017 Elsevier Inc.  

## 2017-10-10 NOTE — Progress Notes (Signed)
Patient ID: [name] [name], female   DOB: November 13, 1965, 52 y.o.   MRN: [id number] History of Present Illness: [name] [name] is a 52 year old black female, married in last year, and sp hysterectomy for fibroids. PCP is Health dept.    Current Medications, Allergies, Past Medical History, Past Surgical History, Family History and Social History were reviewed in [employer] record.     Review of Systems: Patient denies any headaches, hearing loss, fatigue, blurred vision, shortness of breath, chest pain, abdominal pain, problems with bowel movements, urination(will lose urine if laughs hard), or intercourse(has vaginal dryness). No joint pain or mood swings. +hot flashes, and itches when goes outside.  She had hysterectomy between 2004 and 2005 and may still have 1 ovary as seen on CT in 2005, but she thought she had them removed too.    Physical Exam:BP 132/71 (BP Location: Left Arm, Patient Position: Sitting, Cuff Size: Normal)   Pulse 63   Ht 5' (1.524 m)   Wt 185 lb (83.9 kg)   BMI 36.13 kg/m  General:  Well developed, well nourished, no acute distress Skin:  Warm and dry Neck:  Midline trachea, normal thyroid, good ROM, no lymphadenopathy Lungs; Clear to auscultation bilaterally Breast:  No dominant palpable mass, retraction, or nipple discharge Cardiovascular: Regular rate and rhythm Abdomen:  Soft, non tender, no hepatosplenomegaly Pelvic:  External genitalia is normal in appearance, no lesions.  The vagina is normal in appearance.+cystocele. Urethra has no lesions or masses. The cervix and uterus are absent.  No adnexal masses or tenderness noted.Bladder is non tender, no masses felt. Rectal: Good sphincter tone, no polyps, or hemorrhoids felt.  Hemoccult negative. Extremities/musculoskeletal:  No swelling or varicosities noted, no clubbing or cyanosis Psych:  No mood changes, alert and cooperative,seems happy PHQ 2 score 0.Discussed ET risks and benefits, and she  wants to try to the pill form and use good lubricate with sex like astro glide, but can use olive oil and coconut oil.   Impression: 1. Encounter for well woman exam with routine gynecological exam   2. Screening for colorectal cancer   3. Vaginal dryness   4. Cystocele, midline   5. Hot flashes   6. Counseling for estrogen replacement therapy       Plan: Meds ordered this encounter  Medications  . estradiol (ESTRACE) 1 MG tablet    Sig: Take 1 tablet (1 mg total) by mouth daily.    Dispense:  30 tablet    Refill:  6    Order Specific Question:   Supervising Provider    Answer:   Elonda Husky, LUTHER H [[id number]]   F/U in 3 months for ROS Physical in 1 year Get mammogram now and yearly Colonoscopy per GI Labs at PCP Review handouts on menopause and menopause and HRT.

## 2017-10-13 ENCOUNTER — Telehealth: Payer: Self-pay | Admitting: *Deleted

## 2017-10-13 NOTE — Telephone Encounter (Signed)
Was wondering if any meds that would increase sex drive, not yet for PM females, keep taking the estrogen and see if that helps

## 2017-10-13 NOTE — Telephone Encounter (Signed)
Left message I called 

## 2017-10-25 ENCOUNTER — Ambulatory Visit (INDEPENDENT_AMBULATORY_CARE_PROVIDER_SITE_OTHER): Payer: No Typology Code available for payment source | Admitting: Internal Medicine

## 2017-10-25 ENCOUNTER — Encounter: Payer: Self-pay | Admitting: Internal Medicine

## 2017-10-25 ENCOUNTER — Other Ambulatory Visit: Payer: Self-pay

## 2017-10-25 VITALS — BP 114/78 | HR 70 | Temp 97.9°F | Ht 60.0 in | Wt 189.6 lb

## 2017-10-25 DIAGNOSIS — K5909 Other constipation: Secondary | ICD-10-CM | POA: Diagnosis not present

## 2017-10-25 DIAGNOSIS — R1115 Cyclical vomiting syndrome unrelated to migraine: Secondary | ICD-10-CM

## 2017-10-25 DIAGNOSIS — K3184 Gastroparesis: Secondary | ICD-10-CM

## 2017-10-25 DIAGNOSIS — G43A0 Cyclical vomiting, not intractable: Secondary | ICD-10-CM | POA: Diagnosis not present

## 2017-10-25 MED ORDER — PANTOPRAZOLE SODIUM 40 MG PO TBEC: 40.0000 mg | DELAYED_RELEASE_TABLET | Freq: Every day | ORAL | 11 refills | Status: DC

## 2017-10-25 NOTE — Patient Instructions (Signed)
Continue Zofran as needed  Begin Protonix 40 mg daily (30) with 11 refills)  Gastroparesis diet info  See ENT about neck abnormality as previously recommended  Continue Miralax daily for constipation as needed  Office visit with [place] in 3 months

## 2017-10-25 NOTE — Progress Notes (Signed)
Primary Care Physician:  Raiford Simmonds., PA-C Primary Gastroenterologist:  Dr. Gala Romney  Pre-Procedure History & Physical: HPI:  [name] [name] is a 52 y.o. female here for follow-up of idiopathic gastroparesis. Has nausea with rare emesis on occasion  -  sometimes after eating a fatty meal. Takes Zofran as needed with alleviation in symptoms.. She's gained 2-1/2 pounds since her last visit. No diabetes. Chronic constipation-effectively managed with MiraLAX. She is not on any acid suppression therapy.  Patient denies any typicalt heartburn symptoms. No dysphagia. No melena or rectal bleeding. No opioids.  Asymmetrical soft tissue neck abnormality on cervical CT for which ENT evaluation has been recommended. We have made appointments but she has not followed through. She says the neck pain which triggered evaluation has improved.  Past Medical History:  Diagnosis Date  . Grave's disease    diagnosed years ago/ now hypothyroidism  . Hypertension   . IBS (irritable colon syndrome)    constipation predominant  . Nondiabetic gastroparesis     Past Surgical History:  Procedure Laterality Date  . ABDOMINAL HYSTERECTOMY    . attempted colonoscopy  03/2004   Dr. Demetreus Lothamer--> prep was poor. Exam to 40 cm. No gross abnormalities. Internal hemorrhoids. Air contrast barium enema was normal.  . CHOLECYSTECTOMY    . COLONOSCOPY N/A 10/30/2014   RMR: normal  . ESOPHAGEAL DILATION N/A 10/30/2014   Procedure: ESOPHAGEAL DILATION;  Surgeon: Daneil Dolin, MD;  Location: [facility];  Service: Endoscopy;  Laterality: N/A;  . ESOPHAGOGASTRODUODENOSCOPY  01/2004   Dr. Almyra Free small polyps in postbulbar area ablated  . ESOPHAGOGASTRODUODENOSCOPY N/A 10/30/2014   [id number]  . S/P Hysterectomy     . TONSILLECTOMY    . TUBAL LIGATION      Prior to Admission medications   Medication Sig Start Date End Date Taking? Authorizing Provider  atorvastatin (LIPITOR) 20 MG tablet Take 20 mg by mouth daily.    Yes [provider]  cetirizine (ZYRTEC) 10 MG tablet Take 10 mg by mouth daily.   Yes [provider]  estradiol (ESTRACE) 1 MG tablet Take 1 tablet (1 mg total) by mouth daily. 10/10/17  Yes Derrek Monaco A, NP  hydrochlorothiazide (HYDRODIURIL) 25 MG tablet Take 25 mg by mouth daily. 09/29/17  Yes [provider]  hydrocortisone cream 1 % Apply 1 application topically as needed. For itching    Yes [provider]  levothyroxine (SYNTHROID, LEVOTHROID) 75 MCG tablet Take 75 mcg by mouth daily. 09/29/17  Yes [provider]  lisinopril-hydrochlorothiazide (PRINZIDE,ZESTORETIC) 20-25 MG tablet Take 1 tablet by mouth daily. 04/27/17  Yes [provider]  loratadine (CLARITIN) 10 MG tablet Take 10 mg by mouth daily. 09/27/17  Yes [provider]  lovastatin (MEVACOR) 20 MG tablet Take 20 mg by mouth at bedtime.   Yes [provider]  ondansetron (ZOFRAN ODT) 4 MG disintegrating tablet Take 1 tablet (4 mg total) by mouth every 6 (six) hours as needed for nausea or vomiting. 05/24/17  Yes Mahala Menghini, PA-C  polyethylene glycol (MIRALAX / GLYCOLAX) packet Take 17 g by mouth daily.   Yes [provider]  sodium chloride (OCEAN) 0.65 % SOLN nasal spray Place 1 spray into both nostrils as needed for congestion.   Yes [provider]  levothyroxine (SYNTHROID, LEVOTHROID) 88 MCG tablet Take 88 mcg by mouth daily before breakfast.    [provider]  metoprolol tartrate (LOPRESSOR) 25 MG tablet Take 25 mg by mouth  2 (two) times daily.    [provider]  ondansetron (ZOFRAN ODT) 4 MG disintegrating tablet Take 1 tablet (4 mg total) by mouth 4 (four) times daily -  before meals and at bedtime. Patient not taking: Reported on 10/10/2017 06/14/17   Mahala Menghini, PA-C    Allergies as of 10/25/2017 - Review Complete 10/25/2017  Allergen Reaction Noted  . Codeine Nausea And Vomiting     Family History    Problem Relation Age of Onset  . Other Father 75       hit by train  . Heart disease Mother   . Kidney disease Mother   . Colon cancer Neg Hx     Social History   Socioeconomic History  . Marital status: Single    Spouse name: Not on file  . Number of children: 1  . Years of education: Not on file  . Highest education level: Not on file  Occupational History  . Occupation: unemployed since July 3    Employer: [employer]  Social Needs  . Financial resource strain: Not on file  . Food insecurity:    Worry: Not on file    Inability: Not on file  . Transportation needs:    Medical: Not on file    Non-medical: Not on file  Tobacco Use  . Smoking status: Never Smoker  . Smokeless tobacco: Never Used  Substance and Sexual Activity  . Alcohol use: Yes    Comment: occ  . Drug use: No  . Sexual activity: Yes    Birth control/protection: Surgical    Comment: hyst  Lifestyle  . Physical activity:    Days per week: Not on file    Minutes per session: Not on file  . Stress: Not on file  Relationships  . Social connections:    Talks on phone: Not on file    Gets together: Not on file    Attends religious service: Not on file    Active member of club or organization: Not on file    Attends meetings of clubs or organizations: Not on file    Relationship status: Not on file  . Intimate partner violence:    Fear of current or ex partner: Not on file    Emotionally abused: Not on file    Physically abused: Not on file    Forced sexual activity: Not on file  Other Topics Concern  . Not on file  Social History Narrative   1 grown healthy daughter    Review of Systems: See HPI, otherwise negative ROS  Physical Exam: BP 114/78   Pulse 70   Temp 97.9 F (36.6 C) (Oral)   Ht 5' (1.524 m)   Wt 189 lb 9.6 oz (86 kg)   BMI 37.03 kg/m  General:   Alert,   pleasant and cooperative in NAD Skin:  Intact without significant lesions or rashes. Lungs:  Clear  throughout to auscultation.   No wheezes, crackles, or rhonchi. No acute distress. Heart:  Regular rate and rhythm; no murmurs, clicks, rubs,  or gallops. Abdomen: nondistended. No succussion splash. Soft and nontender without appreciable mass or organomegaly Pulses:  Normal pulses noted. Extremities:  Without clubbing or edema.  Impression:  52 year old lady with idiopathic gastroparesis ( likely may have  somewhat of a diffuse GI motility disorder). Postprandial nausea is her main complaint. Not mentioned above, gallbladder out. Discussed treatment options for gastroparesis including small feedings and minimizing high fat foods and alcohol.. She  may benefit from empiric acid suppression therapy. Abnormal soft tissue of her neck on CT. She is well aware of our  prior recommendations and said she will follow through on seeing an ENT doctor.   Recommendations: Continue Zofran as needed  Begin Protonix 40 mg daily (30) with 11 refills)  Gastroparesis diet info  See ENT about neck abnormality as previously recommended  Continue Miralax daily for constipation as needed  Office visit with [place] in 3 months    Notice: This dictation was prepared with Dragon dictation along with smaller phrase technology. Any transcriptional errors that result from this process are unintentional and may not be corrected upon review.

## 2017-11-02 ENCOUNTER — Encounter: Payer: Self-pay | Admitting: Gastroenterology

## 2017-11-07 ENCOUNTER — Ambulatory Visit (INDEPENDENT_AMBULATORY_CARE_PROVIDER_SITE_OTHER): Payer: No Typology Code available for payment source | Admitting: Otolaryngology

## 2017-11-07 DIAGNOSIS — R070 Pain in throat: Secondary | ICD-10-CM

## 2017-11-07 DIAGNOSIS — K219 Gastro-esophageal reflux disease without esophagitis: Secondary | ICD-10-CM | POA: Diagnosis not present

## 2017-11-16 ENCOUNTER — Telehealth: Payer: Self-pay | Admitting: Adult Health

## 2017-11-16 NOTE — Telephone Encounter (Signed)
Has breast tenderness, ?related to po estrogen , but hot flashes are good, so cut pill in half and see if that helps, has not lumps or any problems just nipples tender.

## 2017-12-05 ENCOUNTER — Encounter: Payer: Self-pay | Admitting: Internal Medicine

## 2018-01-02 ENCOUNTER — Ambulatory Visit (INDEPENDENT_AMBULATORY_CARE_PROVIDER_SITE_OTHER): Payer: No Typology Code available for payment source | Admitting: Otolaryngology

## 2018-01-06 ENCOUNTER — Telehealth: Payer: Self-pay | Admitting: *Deleted

## 2018-01-06 NOTE — Telephone Encounter (Signed)
Lmom, waiting on a return call.  

## 2018-01-06 NOTE — Telephone Encounter (Signed)
Per patient email sent in by patient: "Hello I seen u one day last month talking about my stomach I'm still feeling sick what ever I eat I'm siick zofran helps a little I don't understand why I feel this way I don't eat much"

## 2018-01-06 NOTE — Telephone Encounter (Signed)
Spoke with pt. She is taking Protonix qam as directed, continuing the Miralax as directed, following a gastroparesis diet. Pt said it doesn't matter what she eats, she is nauseated. Zofran helps some but isn't taking the nausea away fully. Is there anything else pt can try, per pts request. Please advise.

## 2018-01-09 NOTE — Telephone Encounter (Signed)
Please address with Dr. Gala Romney who saw patient last. I have not seen he rin 7 months and tried everything I know to offer.

## 2018-01-10 ENCOUNTER — Ambulatory Visit: Payer: PRIVATE HEALTH INSURANCE | Admitting: Adult Health

## 2018-01-10 NOTE — Telephone Encounter (Signed)
Noted. LMOM, pt is aware that we will be in touch with her soon in regards to her symptoms.

## 2018-01-16 NOTE — Telephone Encounter (Signed)
Lmom, waiting on a return call.  

## 2018-01-16 NOTE — Telephone Encounter (Signed)
Take a dose of Protonix in the evening before supper time and each morning (BID) .  Need to get her back in the see the extender in the next several weeks

## 2018-01-19 NOTE — Telephone Encounter (Signed)
Pt notified to increase PPI per RMR. Pt has an app.

## 2018-01-26 NOTE — Telephone Encounter (Signed)
Pt started taking Protonix bid as directed last week by RMR. Pt is also taking Zofran as needed to help with nausea and pts symptoms haven't changed. Pt feels sick and nauseated after eating. Pt is avoiding oily foods and taking Protonix 30 mins prior to eating. Please advise.

## 2018-01-26 NOTE — Telephone Encounter (Signed)
Lmom, waiting on a return call.  

## 2018-01-30 ENCOUNTER — Ambulatory Visit (INDEPENDENT_AMBULATORY_CARE_PROVIDER_SITE_OTHER): Payer: No Typology Code available for payment source | Admitting: Otolaryngology

## 2018-02-13 ENCOUNTER — Other Ambulatory Visit: Payer: Self-pay

## 2018-02-13 ENCOUNTER — Ambulatory Visit (INDEPENDENT_AMBULATORY_CARE_PROVIDER_SITE_OTHER): Payer: PRIVATE HEALTH INSURANCE | Admitting: Adult Health

## 2018-02-13 ENCOUNTER — Encounter: Payer: Self-pay | Admitting: Adult Health

## 2018-02-13 VITALS — BP 145/79 | HR 77 | Ht 60.0 in | Wt 197.0 lb

## 2018-02-13 DIAGNOSIS — N898 Other specified noninflammatory disorders of vagina: Secondary | ICD-10-CM

## 2018-02-13 MED ORDER — ESTROGENS, CONJUGATED 0.625 MG/GM VA CREA: TOPICAL_CREAM | VAGINAL | 3 refills | Status: DC

## 2018-02-13 NOTE — Progress Notes (Signed)
  Subjective:     Patient ID: [name] [name], female   DOB: 1966/02/10, 52 y.o.   MRN: [id number]  HPI [name] [name] is a 52 year old black female, back in follow up on starting po estrogen and is better with hot flashes, but still has vaginal dryness.   Review of Systems +vaginal dryness Hot flashes better Not itching when goes outside, like before. Using lubricate with sex Reviewed past medical,surgical, social and family history. Reviewed medications and allergies.     Objective:   Physical Exam BP (!) 145/79 (BP Location: Right Arm, Patient Position: Sitting, Cuff Size: Normal)   Pulse 77   Ht 5' (1.524 m)   Wt 197 lb (89.4 kg)   BMI 38.47 kg/m   Talk only:hot flashes much better, will add premarin vaginal cream to see if helps with vaginal dryness.    Assessment:     1. Vaginal dryness       Plan:    Given 2 tubes, 8 Gm to get started, and discount card Meds ordered this encounter  Medications  . conjugated estrogens (PREMARIN) vaginal cream    Sig: Use 0.5 gm in vagina daily at bedtime for 2 weeks then 2-3 x weekly    Dispense:  42.5 g    Refill:  3    Order Specific Question:   Supervising Provider    Answer:   Florian Buff [[id number]]  Continue po estrogen, and lubricate with sex  F/U 11/20 with me

## 2018-02-14 ENCOUNTER — Ambulatory Visit (INDEPENDENT_AMBULATORY_CARE_PROVIDER_SITE_OTHER): Payer: No Typology Code available for payment source | Admitting: Internal Medicine

## 2018-02-14 ENCOUNTER — Telehealth: Payer: Self-pay

## 2018-02-14 ENCOUNTER — Encounter: Payer: Self-pay | Admitting: Internal Medicine

## 2018-02-14 VITALS — BP 122/76 | HR 65 | Temp 98.7°F | Ht 60.0 in | Wt 196.6 lb

## 2018-02-14 DIAGNOSIS — K3184 Gastroparesis: Secondary | ICD-10-CM | POA: Diagnosis not present

## 2018-02-14 DIAGNOSIS — K219 Gastro-esophageal reflux disease without esophagitis: Secondary | ICD-10-CM | POA: Diagnosis not present

## 2018-02-14 NOTE — Patient Instructions (Signed)
Begin Reglan 2.5 mg - 20 minutes AC and Qhs (disp 120 doses - no refills)  Continue Protonix 40 mg daily  Call with any side effects - discussed  Continue miralax as needed for constipation  Gastroparesis diet information  Office visits in 4 weeks

## 2018-02-14 NOTE — Telephone Encounter (Signed)
Reglan medication called into [place] today per RMR after pts ov.

## 2018-02-14 NOTE — Progress Notes (Signed)
Primary Care Physician:  Raiford Simmonds., PA-C Primary Gastroenterologist:  Dr. Gala Romney  Pre-Procedure History & Physical: HPI:  [name] [name] is a 52 y.o. female here for follow-up of nausea.  History of gastroparesis and prior delayed gastric imaging study.  A more recent study, revealed normal gastric emptying - patient states she is nauseated intermittently - any time -  not necessarily precipitated by meals.  Chronic constipation managed well with MiraLAX. Rarely actually vomits.  She is gained 7 pounds since her last office visit.  TFTs and other blood work looked good. Gallbladder long gone.  Prior EGD demonstrated non-H. pylori gastritis - on path.  No medications to implicate.  Status post hysterectomy.  Ondansetron makes little difference with nausea.  Protonix daily squelches her reflux but does not have much effect on nausea. She is trying to adhere to a gastroparesis diet.  Past Medical History:  Diagnosis Date  . Grave's disease    diagnosed years ago/ now hypothyroidism  . Hypertension   . IBS (irritable colon syndrome)    constipation predominant  . Nondiabetic gastroparesis     Past Surgical History:  Procedure Laterality Date  . ABDOMINAL HYSTERECTOMY    . attempted colonoscopy  03/2004   Dr. Huzaifa Viney--> prep was poor. Exam to 40 cm. No gross abnormalities. Internal hemorrhoids. Air contrast barium enema was normal.  . CHOLECYSTECTOMY    . COLONOSCOPY N/A 10/30/2014   RMR: normal  . ESOPHAGEAL DILATION N/A 10/30/2014   Procedure: ESOPHAGEAL DILATION;  Surgeon: Daneil Dolin, MD;  Location: [facility];  Service: Endoscopy;  Laterality: N/A;  . ESOPHAGOGASTRODUODENOSCOPY  01/2004   Dr. Almyra Free small polyps in postbulbar area ablated  . ESOPHAGOGASTRODUODENOSCOPY N/A 10/30/2014   [id number]  . S/P Hysterectomy     . TONSILLECTOMY    . TUBAL LIGATION      Prior to Admission medications   Medication Sig Start Date End Date Taking? Authorizing Provider    atorvastatin (LIPITOR) 20 MG tablet Take 20 mg by mouth daily.   Yes [provider]  cetirizine (ZYRTEC) 10 MG tablet Take 10 mg by mouth daily.   Yes [provider]  conjugated estrogens (PREMARIN) vaginal cream Use 0.5 gm in vagina daily at bedtime for 2 weeks then 2-3 x weekly 02/13/18  Yes Derrek Monaco A, NP  estradiol (ESTRACE) 1 MG tablet Take 1 tablet (1 mg total) by mouth daily. 10/10/17  Yes Derrek Monaco A, NP  hydrochlorothiazide (HYDRODIURIL) 25 MG tablet Take 25 mg by mouth daily. 09/29/17  Yes [provider]  hydrocortisone cream 1 % Apply 1 application topically as needed. For itching    Yes [provider]  levothyroxine (SYNTHROID, LEVOTHROID) 88 MCG tablet Take 88 mcg by mouth daily before breakfast.   Yes [provider]  lisinopril-hydrochlorothiazide (PRINZIDE,ZESTORETIC) 20-25 MG tablet Take 1 tablet by mouth daily. 04/27/17  Yes [provider]  loratadine (CLARITIN) 10 MG tablet Take 10 mg by mouth daily. 09/27/17  Yes [provider]  lovastatin (MEVACOR) 20 MG tablet Take 20 mg by mouth at bedtime.   Yes [provider]  metoprolol tartrate (LOPRESSOR) 25 MG tablet Take 25 mg by mouth 2 (two) times daily.   Yes [provider]  ondansetron (ZOFRAN ODT) 4 MG disintegrating tablet Take 1 tablet (4 mg total) by mouth 4 (four) times daily -  before meals and at bedtime. 06/14/17  Yes Mahala Menghini, PA-C  pantoprazole (PROTONIX) 40 MG tablet  Take 1 tablet (40 mg total) by mouth daily. Patient taking differently: Take 40 mg by mouth 2 (two) times daily.  10/25/17  Yes Kahiau Schewe, Cristopher Estimable, MD  polyethylene glycol [facility] / Floria Raveling) packet Take 17 g by mouth daily.   Yes [provider]  sodium chloride (OCEAN) 0.65 % SOLN nasal spray Place 1 spray into both nostrils as needed for congestion.   Yes [provider]  levothyroxine (SYNTHROID, LEVOTHROID) 75 MCG tablet Take 75 mcg by  mouth daily. 09/29/17   [provider]  ondansetron (ZOFRAN ODT) 4 MG disintegrating tablet Take 1 tablet (4 mg total) by mouth every 6 (six) hours as needed for nausea or vomiting. Patient not taking: Reported on 02/14/2018 05/24/17   Mahala Menghini, PA-C    Allergies as of 02/14/2018 - Review Complete 02/14/2018  Allergen Reaction Noted  . Codeine Nausea And Vomiting     Family History  Problem Relation Age of Onset  . Other Father 78       hit by train  . Heart disease Mother   . Kidney disease Mother   . Colon cancer Neg Hx     Social History   Socioeconomic History  . Marital status: Single    Spouse name: Not on file  . Number of children: 1  . Years of education: Not on file  . Highest education level: Not on file  Occupational History  . Occupation: unemployed since July 3    Employer: [employer]  Social Needs  . Financial resource strain: Not on file  . Food insecurity:    Worry: Not on file    Inability: Not on file  . Transportation needs:    Medical: Not on file    Non-medical: Not on file  Tobacco Use  . Smoking status: Never Smoker  . Smokeless tobacco: Never Used  Substance and Sexual Activity  . Alcohol use: Yes    Comment: occ  . Drug use: No  . Sexual activity: Yes    Birth control/protection: Surgical    Comment: hyst  Lifestyle  . Physical activity:    Days per week: Not on file    Minutes per session: Not on file  . Stress: Not on file  Relationships  . Social connections:    Talks on phone: Not on file    Gets together: Not on file    Attends religious service: Not on file    Active member of club or organization: Not on file    Attends meetings of clubs or organizations: Not on file    Relationship status: Not on file  . Intimate partner violence:    Fear of current or ex partner: Not on file    Emotionally abused: Not on file    Physically abused: Not on file    Forced sexual activity: Not on file  Other  Topics Concern  . Not on file  Social History Narrative   1 grown healthy daughter    Review of Systems: See HPI, otherwise negative ROS  Physical Exam: BP 122/76   Pulse 65   Temp 98.7 F (37.1 C) (Oral)   Ht 5' (1.524 m)   Wt 196 lb 9.6 oz (89.2 kg)   BMI 38.40 kg/m  General:   Alert,  Well-developed, well-nourished, pleasant and cooperative in NAD Neck:  Supple; no masses or thyromegaly. No significant cervical adenopathy. Lungs:  Clear throughout to auscultation.   No wheezes, crackles, or rhonchi. No acute distress.  Heart:  Regular rate and rhythm; no murmurs, clicks, rubs,  or gallops. Abdomen: Non-distended, normal bowel sounds.  No succussion splash.  Soft and nontender without appreciable mass or hepatosplenomegaly.  Pulses:  Normal pulses noted. Extremities:  Without clubbing or edema.  Impression/Plan: Chronic recurrent intermittent nausea with rare episodes of vomiting.  Gastric emptying deemed to be normal on most recent study but abnormal previously.  Gastroparesis can certainly be a dynamic phenomenon.  She is not responded to the regimen provided thus far. If she has an element of gastroparesis -may be elusive and difficult to treat.  Weight gain is somewhat reassuring. Constipation is not an issue these days.  We had a lengthy discussion about trying  low-dose Reglan.  The risks and benefits reviewed.  Specifically, talked about the tardive dyskinesia and at times, if it occurs on medication it could persist after the medication is discontinued.  She wishes to try anything but will look help her feel better.  Recommendations:  Begin Reglan 2.5 mg - 20 minutes AC and Qhs (disp 120 doses - no refills)  Continue Protonix 40 mg daily  Call with any side effects - discussed  Continue miralax as needed for constipation  Gastroparesis diet information  Office visits in 4 weeks    Notice: This dictation was prepared with Dragon dictation along with smaller phrase  technology. Any transcriptional errors that result from this process are unintentional and may not be corrected upon review.

## 2018-02-26 ENCOUNTER — Encounter (HOSPITAL_COMMUNITY): Payer: Self-pay | Admitting: Emergency Medicine

## 2018-02-26 ENCOUNTER — Other Ambulatory Visit: Payer: Self-pay

## 2018-02-26 ENCOUNTER — Emergency Department (HOSPITAL_COMMUNITY)
Admission: EM | Admit: 2018-02-26 | Discharge: 2018-02-26 | Disposition: A | Discharge status: Home / Self Care | Payer: No Typology Code available for payment source | Attending: Emergency Medicine | Admitting: Emergency Medicine

## 2018-02-26 DIAGNOSIS — R21 Rash and other nonspecific skin eruption: Secondary | ICD-10-CM | POA: Diagnosis not present

## 2018-02-26 DIAGNOSIS — E039 Hypothyroidism, unspecified: Secondary | ICD-10-CM | POA: Insufficient documentation

## 2018-02-26 DIAGNOSIS — Z79899 Other long term (current) drug therapy: Secondary | ICD-10-CM | POA: Diagnosis not present

## 2018-02-26 DIAGNOSIS — I10 Essential (primary) hypertension: Secondary | ICD-10-CM | POA: Insufficient documentation

## 2018-02-26 MED ORDER — SULFAMETHOXAZOLE-TRIMETHOPRIM 800-160 MG PO TABS: 1.0000 | ORAL_TABLET | Freq: Two times a day (BID) | ORAL | 0 refills | Status: AC

## 2018-02-26 NOTE — ED Provider Notes (Signed)
[facility] EMERGENCY DEPARTMENT Provider Note   CSN: [id number] Arrival date & time: 02/26/18  1234     History   Chief Complaint Chief Complaint  Patient presents with  . Rash    HPI [name] [name] is a 52 y.o. female.  HPI  Presents with concern of a skin lesion. She was in her usual state of health until 2 days ago, she noticed a small lesion in her right lower abdomen. With mild pressure, the lesion was unroofed, but subsequently reformed, has become slightly larger, approximately the size of a half dollar. There is mild discomfort about the area, but no fever, no chills, no nausea, no vomiting, no other notable skin changes.  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 She has been applying Neosporin, since onset.  Past Medical History:  Diagnosis Date  . Grave's disease    diagnosed years ago/ now hypothyroidism  . Hypertension   . IBS (irritable colon syndrome)    constipation predominant  . Nondiabetic gastroparesis     Patient Active Problem List   Diagnosis Date Noted  . Cystocele, midline 10/10/2017  . Vaginal dryness 10/10/2017  . Screening for colorectal cancer 10/10/2017  . Encounter for well woman exam with routine gynecological exam 10/10/2017  . Abnormal CT scan, neck 06/14/2017  . Odynophagia 01/10/2017  . Neck pain on right side 01/10/2017  . Abdominal pain, epigastric 02/09/2016  . Colon cancer screening   . Mucosal abnormality of stomach   . Dysphagia, pharyngoesophageal phase   . Esophageal dysphagia 10/15/2014  . Chronic fatigue 04/10/2014  . Encounter for screening colonoscopy 09/05/2013  . Chest wall pain 09/11/2012  . Hypothyroidism 11/18/2011  . Obesity, unspecified 11/18/2011  . GERD 12/05/2008  . GASTROPARESIS 04/03/2008  . CONSTIPATION, CHRONIC 04/03/2008    Past Surgical History:  Procedure Laterality Date  . ABDOMINAL HYSTERECTOMY    . attempted colonoscopy   03/2004   Dr. Rourk--> prep was poor. Exam to 40 cm. No gross abnormalities. Internal hemorrhoids. Air contrast barium enema was normal.  . CHOLECYSTECTOMY    . COLONOSCOPY N/A 10/30/2014   RMR: normal  . ESOPHAGEAL DILATION N/A 10/30/2014   Procedure: ESOPHAGEAL DILATION;  Surgeon: Daneil Dolin, MD;  Location: [facility];  Service: Endoscopy;  Laterality: N/A;  . ESOPHAGOGASTRODUODENOSCOPY  01/2004   Dr. Almyra Free small polyps in postbulbar area ablated  . ESOPHAGOGASTRODUODENOSCOPY N/A 10/30/2014   [id number]  . S/P Hysterectomy     . TONSILLECTOMY    . TUBAL LIGATION       OB History    Gravida  1   Para  1   Term  1   Preterm      AB      Living  1     SAB      TAB      Ectopic      Multiple      Live Births               Home Medications    Prior to Admission medications   Medication Sig Start Date End Date Taking? Authorizing Provider  atorvastatin (LIPITOR) 20 MG tablet Take 20 mg by mouth daily.    [provider]  cetirizine (ZYRTEC) 10 MG tablet Take 10 mg by mouth daily.    [provider]  conjugated estrogens (PREMARIN) vaginal cream Use 0.5 gm in vagina daily at bedtime for 2 weeks then  2-3 x weekly 02/13/18   Derrek Monaco A, NP  estradiol (ESTRACE) 1 MG tablet Take 1 tablet (1 mg total) by mouth daily. 10/10/17   Estill Dooms, NP  hydrochlorothiazide (HYDRODIURIL) 25 MG tablet Take 25 mg by mouth daily. 09/29/17   [provider]  hydrocortisone cream 1 % Apply 1 application topically as needed. For itching     [provider]  levothyroxine (SYNTHROID, LEVOTHROID) 75 MCG tablet Take 75 mcg by mouth daily. 09/29/17   [provider]  levothyroxine (SYNTHROID, LEVOTHROID) 88 MCG tablet Take 88 mcg by mouth daily before breakfast.    [provider]  lisinopril-hydrochlorothiazide (PRINZIDE,ZESTORETIC) 20-25 MG tablet Take 1 tablet by mouth daily. 04/27/17   [provider]    loratadine (CLARITIN) 10 MG tablet Take 10 mg by mouth daily. 09/27/17   [provider]  lovastatin (MEVACOR) 20 MG tablet Take 20 mg by mouth at bedtime.    [provider]  metoprolol tartrate (LOPRESSOR) 25 MG tablet Take 25 mg by mouth 2 (two) times daily.    [provider]  ondansetron (ZOFRAN ODT) 4 MG disintegrating tablet Take 1 tablet (4 mg total) by mouth every 6 (six) hours as needed for nausea or vomiting. Patient not taking: Reported on 02/14/2018 05/24/17   Mahala Menghini, PA-C  ondansetron (ZOFRAN ODT) 4 MG disintegrating tablet Take 1 tablet (4 mg total) by mouth 4 (four) times daily -  before meals and at bedtime. 06/14/17   Mahala Menghini, PA-C  pantoprazole (PROTONIX) 40 MG tablet Take 1 tablet (40 mg total) by mouth daily. Patient taking differently: Take 40 mg by mouth 2 (two) times daily.  10/25/17   Rourk, Cristopher Estimable, MD  polyethylene glycol [facility] / Floria Raveling) packet Take 17 g by mouth daily.    [provider]  sodium chloride (OCEAN) 0.65 % SOLN nasal spray Place 1 spray into both nostrils as needed for congestion.    [provider]  sulfamethoxazole-trimethoprim (BACTRIM DS,SEPTRA DS) 800-160 MG tablet Take 1 tablet by mouth 2 (two) times daily for 7 days. 02/26/18 03/05/18  Carmin Muskrat, MD    Family History Family History  Problem Relation Age of Onset  . Other Father 100       hit by train  . Heart disease Mother   . Kidney disease Mother   . Colon cancer Neg Hx     Social History Social History   Tobacco Use  . Smoking status: Never Smoker  . Smokeless tobacco: Never Used  Substance Use Topics  . Alcohol use: Yes    Comment: occ  . Drug use: No     Allergies   Codeine   Review of Systems Review of Systems  Constitutional:       Per HPI, otherwise negative  HENT:       Per HPI, otherwise negative  Respiratory:       Per HPI, otherwise negative  Cardiovascular:       Per HPI, otherwise  negative  Gastrointestinal: Negative for vomiting.  Endocrine:       Negative aside from HPI  Genitourinary:       Neg aside from HPI   Musculoskeletal:       Per HPI, otherwise negative  Skin: Positive for wound.  Neurological: Negative for syncope.     Physical Exam Updated Vital Signs BP (!) 161/74 (BP Location: Right Arm)   Pulse 97   Temp 97.9 F (36.6 C) (Oral)  Resp 18   Ht 5' (1.524 m)   Wt 89 kg   SpO2 98%   BMI 38.32 kg/m   Physical Exam  Constitutional: She is oriented to person, place, and time. She appears well-developed and well-nourished. No distress.  HENT:  Head: Normocephalic and atraumatic.  Eyes: Conjunctivae and EOM are normal.  Cardiovascular: Normal rate and regular rhythm.  Pulmonary/Chest: Effort normal and breath sounds normal. No stridor. No respiratory distress.  Abdominal: She exhibits no distension. There is no tenderness. There is no guarding.  Musculoskeletal: She exhibits no edema.  Neurological: She is alert and oriented to person, place, and time. No cranial nerve deficit.  Skin: Skin is warm and dry.     Psychiatric: She has a normal mood and affect.  Nursing note and vitals reviewed.    ED Treatments / Results   Procedures Procedures (including critical care time)  Medications Ordered in ED Medications - No data to display   Initial Impression / Assessment and Plan / ED Course  I have reviewed the triage vital signs and the nursing notes.  Pertinent labs & imaging results that were available during my care of the patient were reviewed by me and considered in my medical decision making (see chart for details).  This well-appearing female presents with concern of skin lesion. Patient is found to have focal abnormality, more concerning with contact irritation than infection, though with consideration of possible early cellulitis, patient will have a prescription for Bactrim, should erythema develop, or she develop other  concerns. Without other notable findings, with no evidence for back uremia, sepsis, patient discharged in stable condition.  Final Clinical Impressions(s) / ED Diagnoses   Final diagnoses:  Rash    ED Discharge Orders         Ordered    sulfamethoxazole-trimethoprim (BACTRIM DS,SEPTRA DS) 800-160 MG tablet  2 times daily     02/26/18 1320           Carmin Muskrat, MD 02/26/18 1332

## 2018-02-26 NOTE — ED Triage Notes (Signed)
Pt states that she has a rash on her stomach it is sore

## 2018-02-26 NOTE — Discharge Instructions (Signed)
,   Continue to use Neosporin on your skin lesion. If you do develop new spreading redness or other concerns, please feel the provided prescription for Return here for any concerning changes.

## 2018-03-14 ENCOUNTER — Encounter: Payer: Self-pay | Admitting: Internal Medicine

## 2018-03-14 ENCOUNTER — Ambulatory Visit: Payer: PRIVATE HEALTH INSURANCE | Admitting: Adult Health

## 2018-03-14 ENCOUNTER — Ambulatory Visit (INDEPENDENT_AMBULATORY_CARE_PROVIDER_SITE_OTHER): Payer: No Typology Code available for payment source | Admitting: Internal Medicine

## 2018-03-14 VITALS — BP 125/73 | HR 73 | Temp 98.1°F | Ht 60.0 in | Wt 196.4 lb

## 2018-03-14 DIAGNOSIS — R110 Nausea: Secondary | ICD-10-CM | POA: Diagnosis not present

## 2018-03-14 DIAGNOSIS — K219 Gastro-esophageal reflux disease without esophagitis: Secondary | ICD-10-CM

## 2018-03-14 DIAGNOSIS — K5909 Other constipation: Secondary | ICD-10-CM | POA: Diagnosis not present

## 2018-03-14 NOTE — Patient Instructions (Signed)
Increase Reglan to 5 mg before meals and at bedtime  Continue on a gastroparesis diet  Continue Miralax and Protonix  Call us in 2 weeks and let us know how you are doing  Office visit in 6 weeks  If nausea persist, may need further evaluation             Thank you for entrusting me with your care. I appreciate the opportunity to create valuable relationships with patients and family members. You may receive a questionnaire in the mail regarding your visit here today.  It would be appreciated if you would take the time to return it.  If you were not completely satisfied with your experience, I would love to discuss any concerns with you.   Bridgette Habermann, M.D. [facility]

## 2018-03-14 NOTE — Progress Notes (Signed)
Primary Care Physician:  Raiford Simmonds., PA-C Primary Gastroenterologist:  Dr. Gala Romney  Pre-Procedure History & Physical: HPI:  [name] [name] is a 52 y.o. female here for nausea.  Still nauseated intermittently but never vomits.  Not affected with eating.  Constipation well managed with MiraLAX.  Reglan 2.5 before meals and at bedtime no change in nausea and no side effects apparent.  Continues on Protonix 40 mg daily.  Weight stable.  Past Medical History:  Diagnosis Date  . Grave's disease    diagnosed years ago/ now hypothyroidism  . Hypertension   . IBS (irritable colon syndrome)    constipation predominant  . Nondiabetic gastroparesis     Past Surgical History:  Procedure Laterality Date  . ABDOMINAL HYSTERECTOMY    . attempted colonoscopy  03/2004   Dr. Safiyya Stokes--> prep was poor. Exam to 40 cm. No gross abnormalities. Internal hemorrhoids. Air contrast barium enema was normal.  . CHOLECYSTECTOMY    . COLONOSCOPY N/A 10/30/2014   RMR: normal  . ESOPHAGEAL DILATION N/A 10/30/2014   Procedure: ESOPHAGEAL DILATION;  Surgeon: Daneil Dolin, MD;  Location: [facility];  Service: Endoscopy;  Laterality: N/A;  . ESOPHAGOGASTRODUODENOSCOPY  01/2004   Dr. Almyra Free small polyps in postbulbar area ablated  . ESOPHAGOGASTRODUODENOSCOPY N/A 10/30/2014   [id number]  . S/P Hysterectomy     . TONSILLECTOMY    . TUBAL LIGATION      Prior to Admission medications   Medication Sig Start Date End Date Taking? Authorizing Provider  atorvastatin (LIPITOR) 20 MG tablet Take 20 mg by mouth daily.   Yes [provider]  cetirizine (ZYRTEC) 10 MG tablet Take 10 mg by mouth daily.   Yes [provider]  conjugated estrogens (PREMARIN) vaginal cream Use 0.5 gm in vagina daily at bedtime for 2 weeks then 2-3 x weekly 02/13/18  Yes Derrek Monaco A, NP  estradiol (ESTRACE) 1 MG tablet Take 1 tablet (1 mg total) by mouth daily. 10/10/17  Yes Derrek Monaco A, NP    hydrochlorothiazide (HYDRODIURIL) 25 MG tablet Take 25 mg by mouth daily. 09/29/17  Yes [provider]  hydrocortisone cream 1 % Apply 1 application topically as needed. For itching    Yes [provider]  levothyroxine (SYNTHROID, LEVOTHROID) 75 MCG tablet Take 75 mcg by mouth daily. 09/29/17  Yes [provider]  lisinopril-hydrochlorothiazide (PRINZIDE,ZESTORETIC) 20-25 MG tablet Take 1 tablet by mouth daily. 04/27/17  Yes [provider]  loratadine (CLARITIN) 10 MG tablet Take 10 mg by mouth daily. 09/27/17  Yes [provider]  lovastatin (MEVACOR) 20 MG tablet Take 20 mg by mouth at bedtime.   Yes [provider]  metoCLOPramide (REGLAN) 5 MG tablet Take 0.5 tablets by mouth 4 (four) times daily -  before meals and at bedtime. 02/14/18  Yes [provider]  metoprolol tartrate (LOPRESSOR) 25 MG tablet Take 25 mg by mouth 2 (two) times daily.   Yes [provider]  ondansetron (ZOFRAN ODT) 4 MG disintegrating tablet Take 1 tablet (4 mg total) by mouth 4 (four) times daily -  before meals and at bedtime. 06/14/17  Yes Mahala Menghini, PA-C  pantoprazole (PROTONIX) 40 MG tablet Take 1 tablet (40 mg total) by mouth daily. Patient taking differently: Take 40 mg by mouth 2 (two) times daily.  10/25/17  Yes Kelcy Laible, Cristopher Estimable, MD  polyethylene glycol [facility] / Floria Raveling) packet Take 17 g by mouth daily.   Yes [provider]  sodium chloride (OCEAN) 0.65 % SOLN nasal spray Place 1 spray into both nostrils as needed for congestion.   Yes [provider]  levothyroxine (SYNTHROID, LEVOTHROID) 88 MCG tablet Take 88 mcg by mouth daily before breakfast.    [provider]  ondansetron (ZOFRAN ODT) 4 MG disintegrating tablet Take 1 tablet (4 mg total) by mouth every 6 (six) hours as needed for nausea or vomiting. Patient not taking: Reported on 03/14/2018 05/24/17   Mahala Menghini, PA-C    Allergies as of 03/14/2018 -  Review Complete 03/14/2018  Allergen Reaction Noted  . Codeine Nausea And Vomiting     Family History  Problem Relation Age of Onset  . Other Father 51       hit by train  . Heart disease Mother   . Kidney disease Mother   . Colon cancer Neg Hx     Social History   Socioeconomic History  . Marital status: Single    Spouse name: Not on file  . Number of children: 1  . Years of education: Not on file  . Highest education level: Not on file  Occupational History  . Occupation: unemployed since July 3    Employer: [employer]  Social Needs  . Financial resource strain: Not on file  . Food insecurity:    Worry: Not on file    Inability: Not on file  . Transportation needs:    Medical: Not on file    Non-medical: Not on file  Tobacco Use  . Smoking status: Never Smoker  . Smokeless tobacco: Never Used  Substance and Sexual Activity  . Alcohol use: Yes    Comment: occ  . Drug use: No  . Sexual activity: Yes    Birth control/protection: Surgical    Comment: hyst  Lifestyle  . Physical activity:    Days per week: Not on file    Minutes per session: Not on file  . Stress: Not on file  Relationships  . Social connections:    Talks on phone: Not on file    Gets together: Not on file    Attends religious service: Not on file    Active member of club or organization: Not on file    Attends meetings of clubs or organizations: Not on file    Relationship status: Not on file  . Intimate partner violence:    Fear of current or ex partner: Not on file    Emotionally abused: Not on file    Physically abused: Not on file    Forced sexual activity: Not on file  Other Topics Concern  . Not on file  Social History Narrative   1 grown healthy daughter    Review of Systems: See HPI, otherwise negative ROS  Physical Exam: BP 125/73   Pulse 73   Temp 98.1 F (36.7 C) (Oral)   Ht 5' (1.524 m)   Wt 196 lb 6.4 oz (89.1 kg)   BMI 38.36 kg/m  General:   Alert,   Well-developed, well-nourished, pleasant and cooperative in NAD Detailed exam deferred  Impression/Plan: Very pleasant 52 year old lady with chronic constipation GERD and nausea.  History of delayed gastric emptying.  Her symptoms do not sound vertiginous at all.  Working diagnosis of gastroparesis.   Although need to consider other much less likely entities if we do not get her nausea under control.  She is on nearly a homeopathic dose of Reglan but wanted to start low and slow to avoid/minimize  side effects.  Recommendations:  Increase Reglan to 5 mg before meals and at bedtime  Continue on a gastroparesis diet  Continue Miralax and Protonix  Call us in 2 weeks and let us know how things are doing  Office visit in 6 weeks  If nausea persist, may need further evaluation      Notice: This dictation was prepared with Dragon dictation along with smaller phrase technology. Any transcriptional errors that result from this process are unintentional and may not be corrected upon review.

## 2018-03-15 ENCOUNTER — Telehealth: Payer: Self-pay | Admitting: Adult Health

## 2018-03-15 MED ORDER — FLUCONAZOLE 150 MG PO TABS: ORAL_TABLET | ORAL | 1 refills | Status: DC

## 2018-03-15 NOTE — Telephone Encounter (Signed)
Patient called stating that she has a yeast infection and would like for [name] [name] to call her something into her pharmacy please contact pt when done

## 2018-03-15 NOTE — Telephone Encounter (Signed)
left message will rx diflucan for yeast

## 2018-03-27 ENCOUNTER — Encounter: Payer: Self-pay | Admitting: Adult Health

## 2018-03-27 ENCOUNTER — Ambulatory Visit (INDEPENDENT_AMBULATORY_CARE_PROVIDER_SITE_OTHER): Payer: PRIVATE HEALTH INSURANCE | Admitting: Adult Health

## 2018-03-27 VITALS — BP 136/71 | HR 69 | Ht 60.0 in | Wt 198.5 lb

## 2018-03-27 DIAGNOSIS — Z79818 Long term (current) use of other agents affecting estrogen receptors and estrogen levels: Secondary | ICD-10-CM

## 2018-03-27 DIAGNOSIS — Z79899 Other long term (current) drug therapy: Secondary | ICD-10-CM

## 2018-03-27 MED ORDER — ESTRADIOL 1 MG PO TABS: 1.0000 mg | ORAL_TABLET | Freq: Every day | ORAL | 12 refills | Status: DC

## 2018-03-27 NOTE — Progress Notes (Addendum)
  Subjective:     Patient ID: [name] [name], female   DOB: 1965/12/12, 52 y.o.   MRN: [id number]  HPI [name] [name] is a 52 year old black female, back in follow up after starting premarin vaginal cream for vaginal dryness, she had already been on estrace po.She says she is better. PCP is [employer] PA.   Review of Systems No hot flashes  Vaginal dryness much better Sex OK Reviewed past medical,surgical, social and family history. Reviewed medications and allergies.     Objective:   Physical Exam BP 136/71 (BP Location: Left Arm, Patient Position: Sitting, Cuff Size: Large)   Pulse 69   Ht 5' (1.524 m)   Wt 198 lb 8 oz (90 kg)   BMI 38.77 kg/m   Fall risk is low. Skin warm and dry.Pelvic: external genitalia is normal in appearance no lesions, vagina: pink and moist,urethra has no lesions or masses noted, cervix and uterus are absent. adnexa: no masses or tenderness noted. Bladder is non tender and no masses felt.     Assessment:     1. Current use of estrogen therapy       Plan:     Meds ordered this encounter  Medications  . estradiol (ESTRACE) 1 MG tablet    Sig: Take 1 tablet (1 mg total) by mouth daily.    Dispense:  30 tablet    Refill:  12    Order Specific Question:   Supervising Provider    Answer:   Tania Ade H [[id number]]  Continue PVC F/U in 6 months or sooner if needed

## 2018-04-21 ENCOUNTER — Telehealth: Payer: Self-pay

## 2018-04-21 NOTE — Telephone Encounter (Signed)
Please review message below sent by pt in mychart. Pt was seen 02/2018 and started Reglan, contained Miralax and Protonix.    Hello Dr Gala Romney the meds are doing fine no sickness lately sorry took so long to get back to you thanks

## 2018-04-25 NOTE — Telephone Encounter (Signed)
Great; f/u as scheduled

## 2018-04-25 NOTE — Telephone Encounter (Signed)
Noted. Message sent to pt in mychart.

## 2018-05-02 ENCOUNTER — Other Ambulatory Visit (HOSPITAL_COMMUNITY)
Admission: RE | Admit: 2018-05-02 | Discharge: 2018-05-02 | Disposition: A | Discharge status: Home / Self Care | Payer: No Typology Code available for payment source | Source: Ambulatory Visit | Attending: *Deleted | Admitting: *Deleted

## 2018-05-02 DIAGNOSIS — N183 Chronic kidney disease, stage 3 (moderate): Secondary | ICD-10-CM | POA: Insufficient documentation
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
## 2018-05-09 ENCOUNTER — Ambulatory Visit: Payer: No Typology Code available for payment source | Admitting: Internal Medicine

## 2018-05-09 ENCOUNTER — Other Ambulatory Visit (HOSPITAL_COMMUNITY)
Admission: RE | Admit: 2018-05-09 | Discharge: 2018-05-09 | Disposition: A | Discharge status: Home / Self Care | Payer: No Typology Code available for payment source | Source: Ambulatory Visit | Attending: *Deleted | Admitting: *Deleted

## 2018-05-09 DIAGNOSIS — N183 Chronic kidney disease, stage 3 (moderate): Secondary | ICD-10-CM | POA: Insufficient documentation

## 2018-05-09 LAB — PROTEIN, URINE, 24 HOUR
Collection Interval-UPROT: 24 hours
Protein, 24H Urine: 80 mg/d (ref 50–100)
Protein, Urine: 8 mg/dL
Urine Total Volume-UPROT: 1000 mL

## 2018-05-09 LAB — CREATININE, URINE, 24 HOUR
Collection Interval-UCRE24: 24 hours
Creatinine, 24H Ur: 1597 mg/d (ref 600–1800)
Creatinine, Urine: 159.72 mg/dL
URINE TOTAL VOLUME-UCRE24: 1000 mL

## 2018-05-13 ENCOUNTER — Encounter (HOSPITAL_COMMUNITY): Payer: Self-pay | Admitting: Emergency Medicine

## 2018-05-13 ENCOUNTER — Emergency Department (HOSPITAL_COMMUNITY): Payer: No Typology Code available for payment source

## 2018-05-13 ENCOUNTER — Other Ambulatory Visit: Payer: Self-pay

## 2018-05-13 ENCOUNTER — Emergency Department (HOSPITAL_COMMUNITY)
Admission: EM | Admit: 2018-05-13 | Discharge: 2018-05-13 | Disposition: A | Discharge status: Home / Self Care | Payer: No Typology Code available for payment source | Attending: Emergency Medicine | Admitting: Emergency Medicine

## 2018-05-13 DIAGNOSIS — I10 Essential (primary) hypertension: Secondary | ICD-10-CM | POA: Insufficient documentation

## 2018-05-13 DIAGNOSIS — R079 Chest pain, unspecified: Secondary | ICD-10-CM | POA: Diagnosis present

## 2018-05-13 DIAGNOSIS — R0789 Other chest pain: Secondary | ICD-10-CM | POA: Insufficient documentation

## 2018-05-13 DIAGNOSIS — Z79899 Other long term (current) drug therapy: Secondary | ICD-10-CM | POA: Insufficient documentation

## 2018-05-13 LAB — BASIC METABOLIC PANEL
ANION GAP: 7 (ref 5–15)
BUN: 12 mg/dL (ref 6–20)
CO2: 24 mmol/L (ref 22–32)
Calcium: 8.8 mg/dL — ABNORMAL LOW (ref 8.9–10.3)
Chloride: 108 mmol/L (ref 98–111)
Creatinine, Ser: 1.33 mg/dL — ABNORMAL HIGH (ref 0.44–1.00)
GFR calc Af Amer: 53 mL/min — ABNORMAL LOW (ref >60)
GFR calc non Af Amer: 46 mL/min — ABNORMAL LOW (ref >60)
Glucose, Bld: 155 mg/dL — ABNORMAL HIGH (ref 70–99)
Potassium: 3.6 mmol/L (ref 3.5–5.1)
Sodium: 139 mmol/L (ref 135–145)

## 2018-05-13 LAB — CBC
HCT: 36.9 % (ref 36.0–46.0)
HEMOGLOBIN: 10.7 g/dL — AB (ref 12.0–15.0)
MCH: 26.5 pg (ref 26.0–34.0)
MCHC: 29 g/dL — ABNORMAL LOW (ref 30.0–36.0)
MCV: 91.3 fL (ref 80.0–100.0)
Platelets: 330 10*3/uL (ref 150–400)
RBC: 4.04 MIL/uL (ref 3.87–5.11)
RDW: 14.1 % (ref 11.5–15.5)
WBC: 6.6 10*3/uL (ref 4.0–10.5)
nRBC: 0 % (ref 0.0–0.2)

## 2018-05-13 LAB — D-DIMER, QUANTITATIVE (NOT AT ARMC): D DIMER QUANT: 0.72 ug{FEU}/mL — AB (ref 0.00–0.50)

## 2018-05-13 LAB — TROPONIN I: Troponin I: <0.03 ng/mL (ref <0.03)

## 2018-05-13 MED ORDER — SODIUM CHLORIDE 0.9 % IV BOLUS
1000.0000 mL | Freq: Once | INTRAVENOUS | Status: AC
  Administered 2018-05-13: 1000 mL via INTRAVENOUS

## 2018-05-13 MED ORDER — HYDROCODONE-ACETAMINOPHEN 5-325 MG PO TABS
2.0000 | ORAL_TABLET | Freq: Once | ORAL | Status: AC
  Administered 2018-05-13: 2 via ORAL
  Filled 2018-05-13: qty 2

## 2018-05-13 MED ORDER — IOPAMIDOL (ISOVUE-370) INJECTION 76%
100.0000 mL | Freq: Once | INTRAVENOUS | Status: AC | PRN
  Administered 2018-05-13: 100 mL via INTRAVENOUS

## 2018-05-13 MED ORDER — METHOCARBAMOL 500 MG PO TABS: 1000.0000 mg | ORAL_TABLET | Freq: Four times a day (QID) | ORAL | 0 refills | Status: DC | PRN

## 2018-05-13 MED ORDER — HYDROCODONE-ACETAMINOPHEN 5-325 MG PO TABS: ORAL_TABLET | ORAL | 0 refills | Status: DC

## 2018-05-13 MED ORDER — SODIUM CHLORIDE 0.9% FLUSH
3.0000 mL | Freq: Once | INTRAVENOUS | Status: AC
  Administered 2018-05-13: 3 mL via INTRAVENOUS

## 2018-05-13 NOTE — ED Triage Notes (Signed)
Patient c/o mid-sternal, non-radiating chest pain that started yesterday. Denies any shortness of breath, nausea, or vomiting but reports some dizziness. Denies any cardiac hx. Patient reports taking some tylenol with some brief relief.

## 2018-05-13 NOTE — ED Provider Notes (Signed)
[facility] EMERGENCY DEPARTMENT Provider Note   CSN: [id number] Arrival date & time: 05/13/18  1203     History   Chief Complaint Chief Complaint  Patient presents with  . Chest Pain    HPI [name] [name] is a 53 y.o. female.  HPI Pt was seen at 1225.  Per pt, c/o gradual onset and worsening of constant mid-sternal chest "pain" for the past 2 days. Pt states the CP has been constant since onset. Pt states she has been experiencing this CP for the past 1 month, off and on. Pt unsure what causes the discomfort to worsen or improve. Pt states her CP can be present up to "hours" at a time. Denies palpitations, no SOB/cough, no back pain, no abd pain, no N/V/D, no injury, no fevers, no rash.    Past Medical History:  Diagnosis Date  . Grave's disease    diagnosed years ago/ now hypothyroidism  . Hypertension   . IBS (irritable colon syndrome)    constipation predominant  . Nondiabetic gastroparesis     Patient Active Problem List   Diagnosis Date Noted  . Cystocele, midline 10/10/2017  . Vaginal dryness 10/10/2017  . Screening for colorectal cancer 10/10/2017  . Encounter for well woman exam with routine gynecological exam 10/10/2017  . Abnormal CT scan, neck 06/14/2017  . Odynophagia 01/10/2017  . Neck pain on right side 01/10/2017  . Abdominal pain, epigastric 02/09/2016  . Colon cancer screening   . Mucosal abnormality of stomach   . Dysphagia, pharyngoesophageal phase   . Esophageal dysphagia 10/15/2014  . Chronic fatigue 04/10/2014  . Encounter for screening colonoscopy 09/05/2013  . Chest wall pain 09/11/2012  . Hypothyroidism 11/18/2011  . Obesity, unspecified 11/18/2011  . GERD 12/05/2008  . GASTROPARESIS 04/03/2008  . CONSTIPATION, CHRONIC 04/03/2008    Past Surgical History:  Procedure Laterality Date  . ABDOMINAL HYSTERECTOMY    . attempted colonoscopy  03/2004   Dr. Rourk--> prep was poor. Exam to 40 cm. No gross abnormalities. Internal  hemorrhoids. Air contrast barium enema was normal.  . CHOLECYSTECTOMY    . COLONOSCOPY N/A 10/30/2014   RMR: normal  . ESOPHAGEAL DILATION N/A 10/30/2014   Procedure: ESOPHAGEAL DILATION;  Surgeon: Daneil Dolin, MD;  Location: [facility]ITE;  Service: Endoscopy;  Laterality: N/A;  . ESOPHAGOGASTRODUODENOSCOPY  01/2004   Dr. Almyra Free small polyps in postbulbar area ablated  . ESOPHAGOGASTRODUODENOSCOPY N/A 10/30/2014   VZD:GLOVFI  . S/P Hysterectomy     . TONSILLECTOMY    . TUBAL LIGATION       OB History    Gravida  1   Para  1   Term  1   Preterm      AB      Living  1     SAB      TAB      Ectopic      Multiple      Live Births  1            Home Medications    Prior to Admission medications   Medication Sig Start Date End Date Taking? Authorizing Provider  acetaminophen (TYLENOL) 500 MG tablet Take 1,000 mg by mouth every 6 (six) hours as needed for headache.   Yes [provider]  atorvastatin (LIPITOR) 20 MG tablet Take 20 mg by mouth daily.   Yes [provider]  cetirizine (ZYRTEC) 10 MG tablet Take 10 mg by mouth daily.   Yes [provider]  conjugated estrogens (PREMARIN) vaginal cream Use 0.5 gm in vagina daily at bedtime for 2 weeks then 2-3 x weekly 02/13/18  Yes Derrek Monaco A, NP  estradiol (ESTRACE) 1 MG tablet Take 1 tablet (1 mg total) by mouth daily. 03/27/18  Yes Derrek Monaco A, NP  hydrochlorothiazide (HYDRODIURIL) 25 MG tablet Take 25 mg by mouth daily. 09/29/17  Yes [provider]  hydrocortisone cream 1 % Apply 1 application topically as needed for itching. For itching    Yes [provider]  levothyroxine (SYNTHROID, LEVOTHROID) 75 MCG tablet Take 75 mcg by mouth daily. 09/29/17  Yes [provider]  lisinopril-hydrochlorothiazide (PRINZIDE,ZESTORETIC) 20-25 MG tablet Take 1 tablet by mouth daily. 04/27/17  Yes [provider]  loratadine (CLARITIN) 10 MG tablet Take  10 mg by mouth daily. 09/27/17  Yes [provider]  lovastatin (MEVACOR) 20 MG tablet Take 20 mg by mouth at bedtime.   Yes [provider]  metoCLOPramide (REGLAN) 5 MG tablet Take 0.5 tablets by mouth 4 (four) times daily -  before meals and at bedtime. 02/14/18  Yes [provider]  metoprolol tartrate (LOPRESSOR) 25 MG tablet Take 25 mg by mouth daily.    Yes [provider]  ondansetron (ZOFRAN ODT) 4 MG disintegrating tablet Take 1 tablet (4 mg total) by mouth 4 (four) times daily -  before meals and at bedtime. 06/14/17  Yes Mahala Menghini, PA-C  pantoprazole (PROTONIX) 40 MG tablet Take 1 tablet (40 mg total) by mouth daily. Patient taking differently: Take 40 mg by mouth 2 (two) times daily.  10/25/17  Yes Rourk, Cristopher Estimable, MD  polyethylene glycol [facility] / Floria Raveling) packet Take 17 g by mouth daily.   Yes [provider]  sodium chloride (OCEAN) 0.65 % SOLN nasal spray Place 1 spray into both nostrils as needed for congestion.   Yes [provider]  fluconazole (DIFLUCAN) 150 MG tablet Take 1 now and repeat 1 in 3 days if needed Patient not taking: Reported on 05/13/2018 03/15/18   Estill Dooms, NP    Family History Family History  Problem Relation Age of Onset  . Other Father 20       hit by train  . Heart disease Mother   . Kidney disease Mother   . Colon cancer Neg Hx     Social History Social History   Tobacco Use  . Smoking status: Never Smoker  . Smokeless tobacco: Never Used  Substance Use Topics  . Alcohol use: Yes    Comment: occ  . Drug use: No     Allergies   Codeine   Review of Systems Review of Systems ROS: Statement: All systems negative except as marked or noted in the HPI; Constitutional: Negative for fever and chills. ; ; Eyes: Negative for eye pain, redness and discharge. ; ; ENMT: Negative for ear pain, hoarseness, nasal congestion, sinus pressure and sore throat. ; ; Cardiovascular: +CP.  Negative for palpitations, diaphoresis, dyspnea and peripheral edema. ; ; Respiratory: Negative for cough, wheezing and stridor. ; ; Gastrointestinal: Negative for nausea, vomiting, diarrhea, abdominal pain, blood in stool, hematemesis, jaundice and rectal bleeding. . ; ; Genitourinary: Negative for dysuria, flank pain and hematuria. ; ; Musculoskeletal: Negative for back pain and neck pain. Negative for swelling and trauma.; ; Skin: Negative for pruritus, rash, abrasions, blisters, bruising and skin lesion.; ; Neuro: Negative for headache, lightheadedness and neck stiffness. Negative for weakness, altered level of consciousness, altered  mental status, extremity weakness, paresthesias, involuntary movement, seizure and syncope.       Physical Exam Updated Vital Signs BP 138/73   Pulse 65   Temp 98 F (36.7 C) (Oral)   Resp 15   Ht 5\' 1"  (1.549 m)   Wt 84.8 kg   SpO2 99%   BMI 35.33 kg/m    Patient Vitals for the past 24 hrs:  BP Temp Temp src Pulse Resp SpO2 Height Weight  05/13/18 1500 138/73 - - 65 15 99 % - -  05/13/18 1400 137/73 - - 66 - 98 % - -  05/13/18 1230 (!) 146/70 - - 67 (!) 24 100 % - -  05/13/18 1216 (!) 178/103 98 F (36.7 C) Oral 75 18 100 % - -  05/13/18 1214 - - - - - - 5\' 1"  (1.549 m) 84.8 kg     Physical Exam 1230: Physical examination:  Nursing notes reviewed; Vital signs and O2 SAT reviewed;  Constitutional: Well developed, Well nourished, Well hydrated, In no acute distress; Head:  Normocephalic, atraumatic; Eyes: EOMI, PERRL, No scleral icterus; ENMT: Mouth and pharynx normal, Mucous membranes moist; Neck: Supple, Full range of motion, No lymphadenopathy; Cardiovascular: Regular rate and rhythm, No gallop; Respiratory: Breath sounds clear & equal bilaterally, No wheezes.  Speaking full sentences with ease, Normal respiratory effort/excursion; Chest: Nontender, Movement normal; Abdomen: Soft, Nontender, Nondistended, Normal bowel sounds; Genitourinary: No CVA  tenderness; Extremities: Peripheral pulses normal, No tenderness, No edema, No calf edema or asymmetry.; Neuro: AA&Ox3, Major CN grossly intact.  Speech clear. No gross focal motor or sensory deficits in extremities.; Skin: Color normal, Warm, Dry.    ED Treatments / Results  Labs (all labs ordered are listed, but only abnormal results are displayed)   EKG EKG Interpretation  Date/Time:  Saturday May 13 2018 12:20:27 EST Ventricular Rate:  72 PR Interval:    QRS Duration: 83 QT Interval:  400 QTC Calculation: 438 R Axis:   55 Text Interpretation:  Sinus rhythm When compared with ECG of 02/05/2016 No significant change was found Confirmed by Francine Graven [phone]) on 05/13/2018 1:47:03 PM   Radiology   Procedures Procedures (including critical care time)  Medications Ordered in ED Medications  sodium chloride 0.9 % bolus 1,000 mL (1,000 mLs Intravenous New Bag/Given 05/13/18 1448)  sodium chloride flush (NS) 0.9 % injection 3 mL (3 mLs Intravenous Given 05/13/18 1313)  HYDROcodone-acetaminophen (NORCO/VICODIN) 5-325 MG per tablet 2 tablet (2 tablets Oral Given 05/13/18 1308)  iopamidol (ISOVUE-370) 76 % injection 100 mL (100 mLs Intravenous Contrast Given 05/13/18 1435)     Initial Impression / Assessment and Plan / ED Course  I have reviewed the triage vital signs and the nursing notes.  Pertinent labs & imaging results that were available during my care of the patient were reviewed by me and considered in my medical decision making (see chart for details).  MDM Reviewed: previous chart, nursing note and vitals Reviewed previous: labs and ECG Interpretation: labs, ECG, x-ray and CT scan   Results for orders placed or performed during the hospital encounter of 55/73/22  Basic metabolic panel  Result Value Ref Range   Sodium 139 135 - 145 mmol/L   Potassium 3.6 3.5 - 5.1 mmol/L   Chloride 108 98 - 111 mmol/L   CO2 24 22 - 32 mmol/L   Glucose, Bld 155 (H) 70 - 99  mg/dL   BUN 12 6 - 20 mg/dL   Creatinine, Ser 1.33 (H)  0.44 - 1.00 mg/dL   Calcium 8.8 (L) 8.9 - 10.3 mg/dL   GFR calc non Af Amer 46 (L) >60 mL/min   GFR calc Af Amer 53 (L) >60 mL/min   Anion gap 7 5 - 15  CBC  Result Value Ref Range   WBC 6.6 4.0 - 10.5 K/uL   RBC 4.04 3.87 - 5.11 MIL/uL   Hemoglobin 10.7 (L) 12.0 - 15.0 g/dL   HCT 36.9 36.0 - 46.0 %   MCV 91.3 80.0 - 100.0 fL   MCH 26.5 26.0 - 34.0 pg   MCHC 29.0 (L) 30.0 - 36.0 g/dL   RDW 14.1 11.5 - 15.5 %   Platelets 330 150 - 400 K/uL   nRBC 0.0 0.0 - 0.2 %  Troponin I - ONCE - STAT  Result Value Ref Range   Troponin I <0.03 <0.03 ng/mL  D-dimer, quantitative  Result Value Ref Range   D-Dimer, Quant 0.72 (H) 0.00 - 0.50 ug/mL-FEU   Dg Chest 2 View Result Date: 05/13/2018 CLINICAL DATA:  Chest pain EXAM: CHEST - 2 VIEW COMPARISON:  09/17/2013 FINDINGS: Borderline cardiac enlargement with increased vascular congestion. No focal pneumonia, collapse or consolidation. Negative for significant edema, effusion or pneumothorax. Trachea is midline. No acute osseous finding. Remote cholecystectomy. IMPRESSION: Mild cardiac enlargement with vascular congestion. Electronically Signed   By: Jerilynn Mages.  Shick M.D.   On: 05/13/2018 13:10   Ct Angio Chest Pe W/cm &/or Wo Cm Result Date: 05/13/2018 CLINICAL DATA:  Chest pain EXAM: CT ANGIOGRAPHY CHEST WITH CONTRAST TECHNIQUE: Multidetector CT imaging of the chest was performed using the standard protocol during bolus administration of intravenous contrast. Multiplanar CT image reconstructions and MIPs were obtained to evaluate the vascular anatomy. CONTRAST:  148mL ISOVUE-370 IOPAMIDOL (ISOVUE-370) INJECTION 76% COMPARISON:  Chest radiograph March 14, 2019 FINDINGS: Cardiovascular: There is no demonstrable pulmonary embolus. There is no thoracic aortic aneurysm or dissection. The visualized great vessels appear unremarkable. Note that the right innominate and left common carotid arteries arise as a  common trunk, an anatomic variant. There is no pericardial effusion or pericardial thickening. Mediastinum/Nodes: Thyroid is diminutive. There is no appreciable thoracic adenopathy. No esophageal lesions are evident. Lungs/Pleura: There is bibasilar atelectatic change. There is no appreciable edema or consolidation. On axial slice 57 series 7, there is a 3 mm nodular opacity arising from the minor fissure on the right, a probable small intrafissural lymph node. Upper Abdomen: Visualized upper abdominal structures appear unremarkable except for absence of gallbladder. Musculoskeletal: There are no blastic or lytic bone lesions. No chest wall lesions are evident. Review of the MIP images confirms the above findings. IMPRESSION: 1. No demonstrable pulmonary embolus. No thoracic aortic aneurysm or dissection. 2. No lung edema or consolidation. 3 mm nodular opacity on the right arising along the minor fissure, a probable small intra-fissural lymph node. No follow-up needed if patient is low-risk. Non-contrast chest CT can be considered in 12 months if patient is high-risk. This recommendation follows the consensus statement: Guidelines for Management of Incidental Pulmonary Nodules Detected on CT Images: From the Fleischner Society 2017; Radiology 2017; 284:228-243. 3.  No demonstrable thoracic adenopathy. 4.  Gallbladder absent. Electronically Signed   By: Lowella Grip III M.D.   On: 05/13/2018 15:03    1530:  Doubt PE as cause for symptoms with negative CTA chest.  Doubt ACS as cause for symptoms with normal troponin and unchanged EKG from previous after 2 days of constant symptoms. BUN/Cr within pt's baseline range.  IV NS bolus given as well as pain meds with improvement of symptoms and BP. Pt states she is ready to go home now. Dx and testing, including incidental findings, d/w pt.  Questions answered.  Verb understanding, agreeable to d/c home with outpt f/u.     Final Clinical Impressions(s) / ED  Diagnoses   Final diagnoses:  Chest wall pain    ED Discharge Orders    None       Francine Graven, DO 05/17/18 1216

## 2018-05-13 NOTE — Discharge Instructions (Signed)
Take the prescriptions as directed.  Apply moist heat or ice to the area(s) of discomfort, for 15 minutes at a time, several times per day for the next few days.  Do not fall asleep on a heating or ice pack. Your renal function test (BUN/Cr) was mildly elevated today, but within your baseline range. You will need to have your regular medical doctor continue to follow this finding. Your CT scan showed an incidental finding: "3 mm nodular opacity on the right arising along the minor fissure, a probable small intra-fissural lymph node. No follow-up needed if patient is low-risk. Non-contrast chest CT can be considered in 12 months if patient is high-risk. This recommendation follows the consensus statement: Guidelines for Management of Incidental Pulmonary Nodules Detected on CT Images: From the Fleischner Society 2017; Radiology 2017; [id number]."  Your regular medical doctor can follow up this finding.  Call your regular medical doctor on Monday to schedule a follow up appointment this week.  Return to the Emergency Department immediately if worsening.

## 2018-06-06 ENCOUNTER — Emergency Department (HOSPITAL_COMMUNITY)
Admission: EM | Admit: 2018-06-06 | Discharge: 2018-06-06 | Disposition: A | Discharge status: Home / Self Care | Payer: No Typology Code available for payment source | Attending: Emergency Medicine | Admitting: Emergency Medicine

## 2018-06-06 ENCOUNTER — Encounter (HOSPITAL_COMMUNITY): Payer: Self-pay | Admitting: Emergency Medicine

## 2018-06-06 ENCOUNTER — Other Ambulatory Visit: Payer: Self-pay

## 2018-06-06 DIAGNOSIS — J40 Bronchitis, not specified as acute or chronic: Secondary | ICD-10-CM | POA: Diagnosis not present

## 2018-06-06 DIAGNOSIS — J069 Acute upper respiratory infection, unspecified: Secondary | ICD-10-CM | POA: Diagnosis not present

## 2018-06-06 DIAGNOSIS — E039 Hypothyroidism, unspecified: Secondary | ICD-10-CM | POA: Diagnosis not present

## 2018-06-06 DIAGNOSIS — I10 Essential (primary) hypertension: Secondary | ICD-10-CM | POA: Insufficient documentation

## 2018-06-06 DIAGNOSIS — F17290 Nicotine dependence, other tobacco product, uncomplicated: Secondary | ICD-10-CM | POA: Insufficient documentation

## 2018-06-06 DIAGNOSIS — Z9049 Acquired absence of other specified parts of digestive tract: Secondary | ICD-10-CM | POA: Insufficient documentation

## 2018-06-06 DIAGNOSIS — Z79899 Other long term (current) drug therapy: Secondary | ICD-10-CM | POA: Diagnosis not present

## 2018-06-06 DIAGNOSIS — R05 Cough: Secondary | ICD-10-CM | POA: Diagnosis present

## 2018-06-06 MED ORDER — ALBUTEROL SULFATE HFA 108 (90 BASE) MCG/ACT IN AERS
2.0000 | INHALATION_SPRAY | Freq: Once | RESPIRATORY_TRACT | Status: AC
  Administered 2018-06-06: 2 via RESPIRATORY_TRACT
  Filled 2018-06-06: qty 6.7

## 2018-06-06 MED ORDER — DEXAMETHASONE 4 MG PO TABS: 4.0000 mg | ORAL_TABLET | Freq: Two times a day (BID) | ORAL | 0 refills | Status: DC

## 2018-06-06 MED ORDER — HYDROCODONE-HOMATROPINE 5-1.5 MG/5ML PO SYRP: 5.0000 mL | ORAL_SOLUTION | Freq: Four times a day (QID) | ORAL | 0 refills | Status: DC | PRN

## 2018-06-06 MED ORDER — PREDNISONE 20 MG PO TABS
40.0000 mg | ORAL_TABLET | Freq: Once | ORAL | Status: AC
  Administered 2018-06-06: 40 mg via ORAL
  Filled 2018-06-06: qty 2

## 2018-06-06 NOTE — ED Provider Notes (Signed)
[facility] EMERGENCY DEPARTMENT Provider Note   CSN: [id number] Arrival date & time: 06/06/18  1256     History   Chief Complaint Chief Complaint  Patient presents with  . Cough    HPI [name] [name] is a 53 y.o. female.  Pt reports she did not have flu shot this season.  The history is provided by the patient.  Cough  Cough characteristics:  Productive Sputum characteristics:  Green Severity:  Moderate Timing:  Intermittent Progression:  Worsening Chronicity:  New Smoker: yes   Context: sick contacts and weather changes   Relieved by:  Nothing Worsened by:  Nothing Associated symptoms: chills and sore throat   Associated symptoms: no chest pain, no eye discharge, no fever, no shortness of breath and no wheezing   Risk factors: no recent travel     Past Medical History:  Diagnosis Date  . Grave's disease    diagnosed years ago/ now hypothyroidism  . Hypertension   . IBS (irritable colon syndrome)    constipation predominant  . Nondiabetic gastroparesis     Patient Active Problem List   Diagnosis Date Noted  . Cystocele, midline 10/10/2017  . Vaginal dryness 10/10/2017  . Screening for colorectal cancer 10/10/2017  . Encounter for well woman exam with routine gynecological exam 10/10/2017  . Abnormal CT scan, neck 06/14/2017  . Odynophagia 01/10/2017  . Neck pain on right side 01/10/2017  . Abdominal pain, epigastric 02/09/2016  . Colon cancer screening   . Mucosal abnormality of stomach   . Dysphagia, pharyngoesophageal phase   . Esophageal dysphagia 10/15/2014  . Chronic fatigue 04/10/2014  . Encounter for screening colonoscopy 09/05/2013  . Chest wall pain 09/11/2012  . Hypothyroidism 11/18/2011  . Obesity, unspecified 11/18/2011  . GERD 12/05/2008  . GASTROPARESIS 04/03/2008  . CONSTIPATION, CHRONIC 04/03/2008    Past Surgical History:  Procedure Laterality Date  . ABDOMINAL HYSTERECTOMY    . attempted colonoscopy  03/2004   Dr.  Rourk--> prep was poor. Exam to 40 cm. No gross abnormalities. Internal hemorrhoids. Air contrast barium enema was normal.  . CHOLECYSTECTOMY    . COLONOSCOPY N/A 10/30/2014   RMR: normal  . ESOPHAGEAL DILATION N/A 10/30/2014   Procedure: ESOPHAGEAL DILATION;  Surgeon: Daneil Dolin, MD;  Location: [facility];  Service: Endoscopy;  Laterality: N/A;  . ESOPHAGOGASTRODUODENOSCOPY  01/2004   Dr. Almyra Free small polyps in postbulbar area ablated  . ESOPHAGOGASTRODUODENOSCOPY N/A 10/30/2014   [id number]  . S/P Hysterectomy     . TONSILLECTOMY    . TUBAL LIGATION       OB History    Gravida  1   Para  1   Term  1   Preterm      AB      Living  1     SAB      TAB      Ectopic      Multiple      Live Births  1            Home Medications    Prior to Admission medications   Medication Sig Start Date End Date Taking? Authorizing Provider  acetaminophen (TYLENOL) 500 MG tablet Take 1,000 mg by mouth every 6 (six) hours as needed for headache.    [provider]  atorvastatin (LIPITOR) 20 MG tablet Take 20 mg by mouth daily.    [provider]  cetirizine (ZYRTEC) 10 MG tablet Take 10 mg by mouth daily.  [provider]  conjugated estrogens (PREMARIN) vaginal cream Use 0.5 gm in vagina daily at bedtime for 2 weeks then 2-3 x weekly 02/13/18   Derrek Monaco A, NP  estradiol (ESTRACE) 1 MG tablet Take 1 tablet (1 mg total) by mouth daily. 03/27/18   Estill Dooms, NP  fluconazole (DIFLUCAN) 150 MG tablet Take 1 now and repeat 1 in 3 days if needed Patient not taking: Reported on 05/13/2018 03/15/18   Estill Dooms, NP  hydrochlorothiazide (HYDRODIURIL) 25 MG tablet Take 25 mg by mouth daily. 09/29/17   [provider]  HYDROcodone-acetaminophen (NORCO/VICODIN) 5-325 MG tablet 1 or 2 tabs PO q8 hours prn pain 05/13/18   Francine Graven, DO  hydrocortisone cream 1 % Apply 1 application topically as needed for itching. For  itching     [provider]  levothyroxine (SYNTHROID, LEVOTHROID) 75 MCG tablet Take 75 mcg by mouth daily. 09/29/17   [provider]  lisinopril-hydrochlorothiazide (PRINZIDE,ZESTORETIC) 20-25 MG tablet Take 1 tablet by mouth daily. 04/27/17   [provider]  loratadine (CLARITIN) 10 MG tablet Take 10 mg by mouth daily. 09/27/17   [provider]  lovastatin (MEVACOR) 20 MG tablet Take 20 mg by mouth at bedtime.    [provider]  methocarbamol (ROBAXIN) 500 MG tablet Take 2 tablets (1,000 mg total) by mouth 4 (four) times daily as needed for muscle spasms (muscle spasm/pain). 05/13/18   Francine Graven, DO  metoCLOPramide (REGLAN) 5 MG tablet Take 0.5 tablets by mouth 4 (four) times daily -  before meals and at bedtime. 02/14/18   [provider]  metoprolol tartrate (LOPRESSOR) 25 MG tablet Take 25 mg by mouth daily.     [provider]  ondansetron (ZOFRAN ODT) 4 MG disintegrating tablet Take 1 tablet (4 mg total) by mouth 4 (four) times daily -  before meals and at bedtime. 06/14/17   Mahala Menghini, PA-C  pantoprazole (PROTONIX) 40 MG tablet Take 1 tablet (40 mg total) by mouth daily. Patient taking differently: Take 40 mg by mouth 2 (two) times daily.  10/25/17   Rourk, Cristopher Estimable, MD  polyethylene glycol [facility] / Floria Raveling) packet Take 17 g by mouth daily.    [provider]  sodium chloride (OCEAN) 0.65 % SOLN nasal spray Place 1 spray into both nostrils as needed for congestion.    [provider]    Family History Family History  Problem Relation Age of Onset  . Other Father 20       hit by train  . Heart disease Mother   . Kidney disease Mother   . Colon cancer Neg Hx     Social History Social History   Tobacco Use  . Smoking status: Current Some Day Smoker    Types: E-cigarettes  . Smokeless tobacco: Never Used  Substance Use Topics  . Alcohol use: Yes    Comment: occ  . Drug use: No      Allergies   Codeine   Review of Systems Review of Systems  Constitutional: Positive for chills. Negative for activity change and fever.       All ROS Neg except as noted in HPI  HENT: Positive for sore throat. Negative for nosebleeds.   Eyes: Negative for photophobia and discharge.  Respiratory: Positive for cough. Negative for shortness of breath and wheezing.   Cardiovascular: Negative for chest pain and palpitations.  Gastrointestinal: Negative for abdominal pain and blood in stool.  Genitourinary: Negative for  dysuria, frequency and hematuria.  Musculoskeletal: Negative for arthralgias, back pain and neck pain.  Skin: Negative.   Neurological: Negative for dizziness, seizures and speech difficulty.  Psychiatric/Behavioral: Negative for confusion and hallucinations.     Physical Exam Updated Vital Signs BP (!) 147/84 (BP Location: Right Arm)   Pulse 95   Temp 98.7 F (37.1 C) (Oral)   Resp 16   Ht 5\' 1"  (1.549 m)   Wt 84.8 kg   SpO2 93%   BMI 35.33 kg/m   Physical Exam Vitals signs and nursing note reviewed.  Constitutional:      Appearance: She is well-developed. She is not toxic-appearing.  HENT:     Head: Normocephalic.     Right Ear: Tympanic membrane and external ear normal.     Left Ear: Tympanic membrane and external ear normal.     Nose: Congestion present.  Eyes:     General: Lids are normal.     Pupils: Pupils are equal, round, and reactive to light.  Neck:     Musculoskeletal: Normal range of motion and neck supple.     Vascular: No carotid bruit.  Cardiovascular:     Rate and Rhythm: Normal rate and regular rhythm.     Pulses: Normal pulses.     Heart sounds: Normal heart sounds.  Pulmonary:     Effort: No respiratory distress.     Comments: Course breath sounds few rhonchi noted. Abdominal:     General: Bowel sounds are normal.     Palpations: Abdomen is soft.     Tenderness: There is no abdominal tenderness. There is no guarding.   Musculoskeletal: Normal range of motion.  Lymphadenopathy:     Head:     Right side of head: No submandibular adenopathy.     Left side of head: No submandibular adenopathy.     Cervical: No cervical adenopathy.  Skin:    General: Skin is warm and dry.  Neurological:     Mental Status: She is alert and oriented to person, place, and time.     Cranial Nerves: No cranial nerve deficit.     Sensory: No sensory deficit.  Psychiatric:        Speech: Speech normal.      ED Treatments / Results  Labs (all labs ordered are listed, but only abnormal results are displayed) Labs Reviewed - No data to display  EKG None  Radiology No results found.  Procedures Procedures (including critical care time)  Medications Ordered in ED Medications - No data to display   Initial Impression / Assessment and Plan / ED Course  I have reviewed the triage vital signs and the nursing notes.  Pertinent labs & imaging results that were available during my care of the patient were reviewed by me and considered in my medical decision making (see chart for details).       Final Clinical Impressions(s) / ED Diagnoses MDM  Vital signs reviewed.  Pulse oximetry is 93% on room air.  The examination suggest bronchitis and upper respiratory infection.  Patient has not been traveling out side of the [place] recently.  I have advised the patient that she could also be having some early symptoms of flu.  The patient is speaking in complete sentences.  She is ambulatory without problem.  Patient will be asked to increase fluids, wash hands frequently.  Mask has been provided for the patient.  The patient will be treated with Decadron and Hycodan.  I have  asked the patient to use Tylenol every 4 hours or ibuprofen every 6 hours for fever or body aches or chills.  Patient is to return to the emergency department if any changes in condition, problems, or concerns.   Final diagnoses:  Bronchitis   Upper respiratory tract infection, unspecified type    ED Discharge Orders         Ordered    dexamethasone (DECADRON) 4 MG tablet  2 times daily with meals     06/06/18 1514    HYDROcodone-homatropine (HYCODAN) 5-1.5 MG/5ML syrup  Every 6 hours PRN     06/06/18 1514           Lily Kocher, PA-C 06/07/18 [place], Ankit, MD 06/07/18 1554

## 2018-06-06 NOTE — ED Triage Notes (Addendum)
Patient complaining of cough, body aches, sore throat, and headache x 2 days. States she took tylenol at 1200 today.

## 2018-06-06 NOTE — Discharge Instructions (Signed)
Your examination favors an upper respiratory infection with bronchitis.  Please wash hands frequently, and have everyone in your home wash hands frequently.  Please use Tylenol extra strength every 4 hours for fever or aching.  Use 2 puffs of albuterol every 4 hours.  Use Decadron 2 times daily until all taken.  Please see Ms. Muse in the office for follow-up and recheck.  Return to the emergency department if any high fever, excessive nausea vomiting, shortness of breath, changes in your condition, problems, or concerns.

## 2018-06-13 ENCOUNTER — Ambulatory Visit: Payer: No Typology Code available for payment source | Admitting: Internal Medicine

## 2018-07-25 ENCOUNTER — Other Ambulatory Visit: Payer: Self-pay

## 2018-07-25 ENCOUNTER — Encounter: Payer: Self-pay | Admitting: Internal Medicine

## 2018-07-25 ENCOUNTER — Ambulatory Visit (INDEPENDENT_AMBULATORY_CARE_PROVIDER_SITE_OTHER): Payer: No Typology Code available for payment source | Admitting: Internal Medicine

## 2018-07-25 DIAGNOSIS — K219 Gastro-esophageal reflux disease without esophagitis: Secondary | ICD-10-CM

## 2018-07-25 DIAGNOSIS — R110 Nausea: Secondary | ICD-10-CM | POA: Diagnosis not present

## 2018-07-25 DIAGNOSIS — K5909 Other constipation: Secondary | ICD-10-CM | POA: Diagnosis not present

## 2018-07-25 NOTE — Patient Instructions (Signed)
  Follow Up Instructions:  Stop Reglan/metoclopramide indefinitely  Continue Protonix 40 mg twice daily  Continue MiraLAX daily as needed for constipation  Office visit with [place] in 1 year.

## 2018-07-25 NOTE — Progress Notes (Addendum)
Virtual Visit via Telephone Note Due to COVID-19, visit is conducted virtually at patient's request.   I connected with [name] [name] on 07/25/18 at  2:30 PM EDT by telephone and verified that I am speaking with the correct person using two identifiers.   I discussed the limitations, risks, security and privacy concerns of performing an evaluation and management service by telephone and the availability of in person appointments. I also discussed with the patient that there may be a patient responsible charge related to this service. The patient expressed understanding and agreed to proceed.  No chief complaint on file.  Nausea, constipation, GERD   History of Present Illness:   53 year old lady with history of GERD and gastroparesis.  Last seen last fall.  Reglan bumped up to 5 mg before meals and at bedtime.  Protonix 40 mg twice daily.  At my request, she communicated via my chart back in the fall stated she was doing quite well with this regimen regimen.  Taking MiraLAX as needed for constipation.  Colonoscopy 2016 normal.  Last EGD 2016-for dysphagia.  Normal esophagus gastric erosions.  Mal[place] performed.  No H. pylori on biopsies. Gastric emptying study back in April of last year actually was within normal limits.  Patient tells me today that she is no longer nauseated she figured out when she takes her morning medications she needs to eat before she takes them when she does that no nausea.  No dysphagia.  BMs good on MiraLAX.  Continues on Reglan but misses occasionally and cannot tell any difference.  Saw Dr. Melene Plan, ENT.  Felt she had some musculoskeletal swelling in her neck.  Benign condition.  No biopsy, etc. no further evaluation.       Past Medical History:  Diagnosis Date  . Grave's disease    diagnosed years ago/ now hypothyroidism  . Hypertension   . IBS (irritable colon syndrome)    constipation predominant  . Nondiabetic gastroparesis      Past Surgical  History:  Procedure Laterality Date  . ABDOMINAL HYSTERECTOMY    . attempted colonoscopy  03/2004   Dr. Emeric Novinger--> prep was poor. Exam to 40 cm. No gross abnormalities. Internal hemorrhoids. Air contrast barium enema was normal.  . CHOLECYSTECTOMY    . COLONOSCOPY N/A 10/30/2014   RMR: normal  . ESOPHAGEAL DILATION N/A 10/30/2014   Procedure: ESOPHAGEAL DILATION;  Surgeon: Daneil Dolin, MD;  Location: [facility];  Service: Endoscopy;  Laterality: N/A;  . ESOPHAGOGASTRODUODENOSCOPY  01/2004   Dr. Almyra Free small polyps in postbulbar area ablated  . ESOPHAGOGASTRODUODENOSCOPY N/A 10/30/2014   [id number]  . S/P Hysterectomy     . TONSILLECTOMY    . TUBAL LIGATION       No outpatient medications have been marked as taking for the 07/25/18 encounter (Appointment) with Daneil Dolin, MD.       Observations/Objective: No distress. Unable to perform physical exam due to telephone encounter. No video available.   Assessment and Plan: Chronic constipation well managed now with MiraLAX.  GERD well-controlled.  Nausea, in retrospect, likely related to medications.  Changing the manner which she takes her medications has alleviated her symptoms.  Reglan in this clinical setting likely superfluous and should be stopped.   Follow Up Instructions:  Stop Reglan/metoclopramide indefinitely  Continue Protonix 40 mg twice daily  Continue MiraLAX daily as needed for constipation  Office visit with [place] in 1 year.    I discussed the assessment and treatment  plan with the patient. The patient was provided an opportunity to ask questions and all were answered. The patient agreed with the plan and demonstrated an understanding of the instructions.   The patient was advised to call back or seek an in-person evaluation if the symptoms worsen or if the condition fails to improve as anticipated.  I provided 7.37 minutes of non-face-to-face time during this encounter.   [facility]

## 2018-08-09 ENCOUNTER — Other Ambulatory Visit: Payer: Self-pay | Admitting: Gastroenterology

## 2018-08-14 ENCOUNTER — Telehealth: Payer: Self-pay | Admitting: *Deleted

## 2018-08-14 NOTE — Telephone Encounter (Signed)
Lmom, waiting on a return call.  

## 2018-08-14 NOTE — Telephone Encounter (Signed)
Per patient email sent in from patient:  Hello Dr Gala Romney we talked on the phone last week about my nausea it's back I'm taking zofran it's not working it stays it doesn't go away I feel terrible

## 2018-08-14 NOTE — Telephone Encounter (Signed)
Pt returned call. She has had nausea and vomiting that comes and goes for several months. Pt states that the nausea without vomiting is constant for and she has mild abdominal aches that comes and goes. Pt is taking Zofran four times a day with no relief of nausea. Pt has a hx of Gerd, nausea without vomiting and esophagitis.

## 2018-08-15 NOTE — Telephone Encounter (Signed)
Need list of current medications.  Is she eating before she takes her medications in the morning.  Started doing that previously and the nausea was much better.  Is she on a PPI?,  Etc.

## 2018-08-15 NOTE — Telephone Encounter (Signed)
Lmom, waiting on a return call.  

## 2018-08-16 ENCOUNTER — Telehealth: Payer: Self-pay | Admitting: Internal Medicine

## 2018-08-16 ENCOUNTER — Other Ambulatory Visit: Payer: Self-pay

## 2018-08-16 MED ORDER — METOCLOPRAMIDE HCL 5 MG PO TABS: ORAL_TABLET | ORAL | 0 refills | Status: DC

## 2018-08-16 NOTE — Telephone Encounter (Signed)
See other phone note for documentation.  

## 2018-08-16 NOTE — Telephone Encounter (Signed)
Patient called and said she is having nausea, she is not throwing up, but feels like it.  Lasting all day. Taking Zofran but that is not [phone] She was supposed to call us and relay that information.

## 2018-08-16 NOTE — Telephone Encounter (Signed)
Communication/progress noted.  History of gastroparesis.  Lets reinforce/send her information on the gastroparesis diet.  5-6 smaller meals daily.  Make sure she is taking some food with her other medications.  Continue Protonix, continue Zofran.  For now, will go back with a short course of low-dose Reglan.  Patient understands the risks as she has been on this medication before.  Specifically I want her to take Reglan 2.5 mg twice daily as needed for nausea.  Let us prescribe the 5 mg tablet dispense 30-one half a tablet twice daily as needed no refills.  Call if any side effects.  Telephonic visit with [place] in 4 weeks

## 2018-08-16 NOTE — Telephone Encounter (Signed)
Noted. RX Reglan 5 mg (take 1/2 tab bid as directed) #30 sent to pts pharmacy. Pt is aware of RMR's recommendations, telephone apt scheduled for 09/12/18 @ 8:00 AM. Gastroparesis info mailed to pt.

## 2018-08-16 NOTE — Telephone Encounter (Signed)
Spoke with pt. The nausea started and has been constant for 2 weeks. Pt is taking Protonix bid as directed. Pt's appetite has changed some. She is eating three times daily but if she smells the food, she can't eat it. She is taking Zofran 4 times daily. Pt was asked if she is having regular bowel movements. Pt said her bowel movements are normal but she has been told that her bowels move slowly.

## 2018-09-03 ENCOUNTER — Other Ambulatory Visit: Payer: Self-pay

## 2018-09-03 ENCOUNTER — Emergency Department (HOSPITAL_COMMUNITY): Payer: No Typology Code available for payment source

## 2018-09-03 ENCOUNTER — Emergency Department (HOSPITAL_COMMUNITY)
Admission: EM | Admit: 2018-09-03 | Discharge: 2018-09-03 | Disposition: A | Discharge status: Home / Self Care | Payer: No Typology Code available for payment source | Attending: Emergency Medicine | Admitting: Emergency Medicine

## 2018-09-03 ENCOUNTER — Encounter (HOSPITAL_COMMUNITY): Payer: Self-pay | Admitting: Emergency Medicine

## 2018-09-03 DIAGNOSIS — I10 Essential (primary) hypertension: Secondary | ICD-10-CM | POA: Insufficient documentation

## 2018-09-03 DIAGNOSIS — M25551 Pain in right hip: Secondary | ICD-10-CM | POA: Diagnosis present

## 2018-09-03 DIAGNOSIS — Z87891 Personal history of nicotine dependence: Secondary | ICD-10-CM | POA: Insufficient documentation

## 2018-09-03 DIAGNOSIS — Z79899 Other long term (current) drug therapy: Secondary | ICD-10-CM | POA: Diagnosis not present

## 2018-09-03 DIAGNOSIS — M79671 Pain in right foot: Secondary | ICD-10-CM | POA: Insufficient documentation

## 2018-09-03 MED ORDER — HYDROCODONE-ACETAMINOPHEN 5-325 MG PO TABS: 1.0000 | ORAL_TABLET | Freq: Four times a day (QID) | ORAL | 0 refills | Status: DC | PRN

## 2018-09-03 MED ORDER — ONDANSETRON 4 MG PO TBDP: 4.0000 mg | ORAL_TABLET | Freq: Three times a day (TID) | ORAL | 1 refills | Status: DC | PRN

## 2018-09-03 NOTE — ED Triage Notes (Signed)
Pt states she has not been feeling well for a month. Went to pcp a few days ago and had labs done but has not heard results. Is having pain in right hip and trouble walking because of it.

## 2018-09-03 NOTE — ED Provider Notes (Signed)
[facility] EMERGENCY DEPARTMENT Provider Note   CSN: [id number] Arrival date & time: 09/03/18  0935    History   Chief Complaint Chief Complaint  Patient presents with  . Leg Pain    HPI [name] [name] is a 53 y.o. female.     Patient with a complaint of right hip pain and a complaint of right heel pain.  No distinct injury.  States that this is been ongoing for a few days.  Patient had told triage that she was not feeling well for about a month.  Vital signs were significant for heart rate of 112.  Patient without any chest pain or shortness of breath.  No fevers no upper respiratory symptoms.  Chart review shows that patient is seen for various pain complaints.  Has received hydrocodone in the past but is always been small numbers of prescriptions according to the database.  Oxygen saturations are 95%.     Past Medical History:  Diagnosis Date  . Grave's disease    diagnosed years ago/ now hypothyroidism  . Hypertension   . IBS (irritable colon syndrome)    constipation predominant  . Nondiabetic gastroparesis     Patient Active Problem List   Diagnosis Date Noted  . Cystocele, midline 10/10/2017  . Vaginal dryness 10/10/2017  . Screening for colorectal cancer 10/10/2017  . Encounter for well woman exam with routine gynecological exam 10/10/2017  . Abnormal CT scan, neck 06/14/2017  . Odynophagia 01/10/2017  . Neck pain on right side 01/10/2017  . Abdominal pain, epigastric 02/09/2016  . Colon cancer screening   . Mucosal abnormality of stomach   . Dysphagia, pharyngoesophageal phase   . Esophageal dysphagia 10/15/2014  . Chronic fatigue 04/10/2014  . Encounter for screening colonoscopy 09/05/2013  . Chest wall pain 09/11/2012  . Hypothyroidism 11/18/2011  . Obesity, unspecified 11/18/2011  . GERD 12/05/2008  . GASTROPARESIS 04/03/2008  . CONSTIPATION, CHRONIC 04/03/2008    Past Surgical History:  Procedure Laterality Date  . ABDOMINAL HYSTERECTOMY     . attempted colonoscopy  03/2004   Dr. Rourk--> prep was poor. Exam to 40 cm. No gross abnormalities. Internal hemorrhoids. Air contrast barium enema was normal.  . CHOLECYSTECTOMY    . COLONOSCOPY N/A 10/30/2014   RMR: normal  . ESOPHAGEAL DILATION N/A 10/30/2014   Procedure: ESOPHAGEAL DILATION;  Surgeon: Daneil Dolin, MD;  Location: [facility];  Service: Endoscopy;  Laterality: N/A;  . ESOPHAGOGASTRODUODENOSCOPY  01/2004   Dr. Almyra Free small polyps in postbulbar area ablated  . ESOPHAGOGASTRODUODENOSCOPY N/A 10/30/2014   OAC:ZYSAYT  . S/P Hysterectomy     . TONSILLECTOMY    . TUBAL LIGATION       OB History    Gravida  1   Para  1   Term  1   Preterm      AB      Living  1     SAB      TAB      Ectopic      Multiple      Live Births  1            Home Medications    Prior to Admission medications   Medication Sig Start Date End Date Taking? Authorizing Provider  acetaminophen (TYLENOL) 500 MG tablet Take 1,000 mg by mouth every 6 (six) hours as needed for headache.    [provider]  atorvastatin (LIPITOR) 20 MG tablet Take 20 mg by mouth daily.  [provider]  cetirizine (ZYRTEC) 10 MG tablet Take 10 mg by mouth daily.    [provider]  conjugated estrogens (PREMARIN) vaginal cream Use 0.5 gm in vagina daily at bedtime for 2 weeks then 2-3 x weekly Patient not taking: Reported on 07/25/2018 02/13/18   Estill Dooms, NP  dexamethasone (DECADRON) 4 MG tablet Take 1 tablet (4 mg total) by mouth 2 (two) times daily with a meal. 06/06/18   Lily Kocher, PA-C  estradiol (ESTRACE) 1 MG tablet Take 1 tablet (1 mg total) by mouth daily. 03/27/18   Estill Dooms, NP  fluconazole (DIFLUCAN) 150 MG tablet Take 1 now and repeat 1 in 3 days if needed Patient not taking: Reported on 05/13/2018 03/15/18   Estill Dooms, NP  hydrochlorothiazide (HYDRODIURIL) 25 MG tablet Take 25 mg by mouth daily. 09/29/17   [provider]  HYDROcodone-acetaminophen (NORCO/VICODIN) 5-325 MG tablet 1 or 2 tabs PO q8 hours prn pain 05/13/18   Francine Graven, DO  HYDROcodone-acetaminophen (NORCO/VICODIN) 5-325 MG tablet Take 1 tablet by mouth every 6 (six) hours as needed. 09/03/18   Fredia Sorrow, MD  HYDROcodone-homatropine [facility]) 5-1.5 MG/5ML syrup Take 5 mLs by mouth every 6 (six) hours as needed. 06/06/18   Lily Kocher, PA-C  hydrocortisone cream 1 % Apply 1 application topically as needed for itching. For itching     [provider]  levothyroxine (SYNTHROID, LEVOTHROID) 75 MCG tablet Take 75 mcg by mouth daily. 09/29/17   [provider]  lisinopril-hydrochlorothiazide (PRINZIDE,ZESTORETIC) 20-25 MG tablet Take 1 tablet by mouth daily. 04/27/17   [provider]  loratadine (CLARITIN) 10 MG tablet Take 10 mg by mouth daily. 09/27/17   [provider]  lovastatin (MEVACOR) 20 MG tablet Take 20 mg by mouth at bedtime.    [provider]  methocarbamol (ROBAXIN) 500 MG tablet Take 2 tablets (1,000 mg total) by mouth 4 (four) times daily as needed for muscle spasms (muscle spasm/pain). 05/13/18   Francine Graven, DO  metoCLOPramide (REGLAN) 5 MG tablet Take 0.5 tablets by mouth 4 (four) times daily -  before meals and at bedtime. 02/14/18   [provider]  metoCLOPramide (REGLAN) 5 MG tablet Take 1/2 tab(2.5 mg) bid daily as directed. 08/16/18   Rourk, Cristopher Estimable, MD  metoprolol tartrate (LOPRESSOR) 25 MG tablet Take 25 mg by mouth daily.     [provider]  ondansetron (ZOFRAN ODT) 4 MG disintegrating tablet Take 1 tablet (4 mg total) by mouth every 8 (eight) hours as needed. 09/03/18   Fredia Sorrow, MD  ondansetron (ZOFRAN-ODT) 4 MG disintegrating tablet Take 1 tablet (4 mg total) by mouth every 6 (six) hours as needed for nausea or vomiting. 08/09/18   Mahala Menghini, PA-C  pantoprazole (PROTONIX) 40 MG tablet Take 1 tablet (40 mg total) by mouth  daily. Patient taking differently: Take 40 mg by mouth 2 (two) times daily.  10/25/17   Rourk, Cristopher Estimable, MD  polyethylene glycol [facility] / Floria Raveling) packet Take 17 g by mouth daily.    [provider]  sodium chloride (OCEAN) 0.65 % SOLN nasal spray Place 1 spray into both nostrils as needed for congestion.    [provider]    Family History Family History  Problem Relation Age of Onset  . Other Father 54       hit by train  . Heart disease Mother   . Kidney disease Mother   . Colon cancer Neg  Hx     Social History Social History   Tobacco Use  . Smoking status: Former Smoker    Types: E-cigarettes  . Smokeless tobacco: Never Used  Substance Use Topics  . Alcohol use: Yes    Comment: occ  . Drug use: No     Allergies   Codeine   Review of Systems Review of Systems  Constitutional: Negative for chills and fever.  HENT: Negative for congestion, rhinorrhea and sore throat.   Eyes: Negative for visual disturbance.  Respiratory: Negative for cough and shortness of breath.   Cardiovascular: Negative for chest pain and leg swelling.  Gastrointestinal: Negative for abdominal pain, diarrhea, nausea and vomiting.  Genitourinary: Negative for dysuria.  Musculoskeletal: Positive for arthralgias. Negative for back pain and neck pain.  Skin: Negative for rash.  Neurological: Negative for dizziness, light-headedness and headaches.  Hematological: Does not bruise/bleed easily.  Psychiatric/Behavioral: Negative for confusion.     Physical Exam Updated Vital Signs BP (!) 154/79 (BP Location: Right Arm)   Pulse (!) 112   Temp 97.8 F (36.6 C) (Oral)   Resp 16   Ht 1.651 m (5\' 5" )   Wt 90.7 kg   SpO2 95%   BMI 33.28 kg/m   Physical Exam Vitals signs and nursing note reviewed.  Constitutional:      General: She is not in acute distress.    Appearance: Normal appearance. She is well-developed.  HENT:     Head: Normocephalic and atraumatic.  Eyes:      Conjunctiva/sclera: Conjunctivae normal.  Neck:     Musculoskeletal: Neck supple.  Cardiovascular:     Rate and Rhythm: Normal rate and regular rhythm.     Heart sounds: No murmur.  Pulmonary:     Effort: Pulmonary effort is normal. No respiratory distress.     Breath sounds: Normal breath sounds. No wheezing or rales.  Abdominal:     Palpations: Abdomen is soft.     Tenderness: There is no abdominal tenderness.  Musculoskeletal: Normal range of motion.        General: Tenderness present. No swelling, deformity or signs of injury.     Right lower leg: No edema.     Left lower leg: No edema.     Comments: Good range of motion at the right hip.  Good range of motion at the right knee without any tenderness.  Patient was some palpable tenderness to the heel area.  No erythema no lesion no wound.  Dorsalis pedis pulse in both feet is 1+ good cap refill less than 2 seconds.  Good range of motion at the ankle and toes.  No tenderness to palpation at the Achilles tendon.  Skin:    General: Skin is warm and dry.     Capillary Refill: Capillary refill takes less than 2 seconds.  Neurological:     General: No focal deficit present.     Mental Status: She is alert and oriented to person, place, and time.     Cranial Nerves: No cranial nerve deficit.     Sensory: No sensory deficit.     Motor: No weakness.      ED Treatments / Results  Labs (all labs ordered are listed, but only abnormal results are displayed) Labs Reviewed - No data to display  EKG None  Radiology Dg Foot Complete Right  Result Date: 09/03/2018 CLINICAL DATA:  Acute RIGHT foot pain. No known injury. Initial encounter. EXAM: RIGHT FOOT COMPLETE - 3+ VIEW COMPARISON:  None. FINDINGS: No acute fracture, subluxation or dislocation. No focal bony lesions are identified. The Lisfranc joints and calcaneus are unremarkable. IMPRESSION: Negative. Electronically Signed   By: Margarette Canada M.D.   On: 09/03/2018 12:50   Dg Hip  Unilat With Pelvis 2-3 Views Right  Result Date: 09/03/2018 CLINICAL DATA:  RIGHT hip pain.  Difficulty walking. EXAM: DG HIP (WITH OR WITHOUT PELVIS) 2-3V RIGHT COMPARISON:  None. FINDINGS: Hips are located. No pelvic fracture or sacral fracture. Dedicated views of the RIGHT hip demonstrate no fracture. Mild sclerosis of the acetabular roofs. IMPRESSION: Mild osteoarthritis of the hips.  No acute findings. Electronically Signed   By: Suzy Bouchard M.D.   On: 09/03/2018 10:26    Procedures Procedures (including critical care time)  Medications Ordered in ED Medications - No data to display   Initial Impression / Assessment and Plan / ED Course  I have reviewed the triage vital signs and the nursing notes.  Pertinent labs & imaging results that were available during my care of the patient were reviewed by me and considered in my medical decision making (see chart for details).        Patient nontoxic no acute distress.  Oxygen saturations good.  A little bit of tachycardia.  Raising some concerns perhaps for pulmonary embolus.  Patient's lower extremity without any swelling.  No calf tightness or tenderness.  Patient has discomfort in the heel and discomfort in the right hip.  Clinically no concern for DVT.  Dorsalis pedis pulses 1+ good cap refill to both feet.  Patient states it is painful to walk on the right leg.  Will treat with crutches and a short course of hydrocodone.  Database was reviewed.  Patient has received short courses of pain medication in the past.  But seems appropriate at this time.  Also will be given referral as needed to orthopedics.  Does have primary care doctor to follow-up with.  Patient will return for any shortness of breath or trouble breathing.  Final Clinical Impressions(s) / ED Diagnoses   Final diagnoses:  Hip pain, acute, right  Pain of right heel    ED Discharge Orders         Ordered    HYDROcodone-acetaminophen (NORCO/VICODIN) 5-325 MG tablet   Every 6 hours PRN     09/03/18 1302    ondansetron (ZOFRAN ODT) 4 MG disintegrating tablet  Every 8 hours PRN     09/03/18 1302           Fredia Sorrow, MD 09/03/18 1310

## 2018-09-03 NOTE — Discharge Instructions (Signed)
Use the crutches as needed.  Take the hydrocodone as needed for pain.  Make appointment to follow-up with your regular doctor.  If things do not improve then recommend follow-up with orthopedics.

## 2018-09-04 ENCOUNTER — Other Ambulatory Visit: Payer: Self-pay

## 2018-09-04 ENCOUNTER — Encounter (HOSPITAL_COMMUNITY): Payer: Self-pay

## 2018-09-04 ENCOUNTER — Inpatient Hospital Stay (HOSPITAL_COMMUNITY)
Admission: EM | Admit: 2018-09-04 | Discharge: 2018-09-07 | DRG: 637 | Disposition: A | Discharge status: Home / Self Care | Payer: No Typology Code available for payment source | Attending: Internal Medicine | Admitting: Internal Medicine

## 2018-09-04 DIAGNOSIS — K589 Irritable bowel syndrome without diarrhea: Secondary | ICD-10-CM | POA: Diagnosis present

## 2018-09-04 DIAGNOSIS — Z794 Long term (current) use of insulin: Secondary | ICD-10-CM | POA: Diagnosis not present

## 2018-09-04 DIAGNOSIS — E1310 Other specified diabetes mellitus with ketoacidosis without coma: Secondary | ICD-10-CM

## 2018-09-04 DIAGNOSIS — N179 Acute kidney failure, unspecified: Secondary | ICD-10-CM

## 2018-09-04 DIAGNOSIS — G9341 Metabolic encephalopathy: Secondary | ICD-10-CM | POA: Diagnosis present

## 2018-09-04 DIAGNOSIS — Z79899 Other long term (current) drug therapy: Secondary | ICD-10-CM | POA: Diagnosis not present

## 2018-09-04 DIAGNOSIS — E860 Dehydration: Secondary | ICD-10-CM | POA: Diagnosis present

## 2018-09-04 DIAGNOSIS — Z885 Allergy status to narcotic agent status: Secondary | ICD-10-CM | POA: Diagnosis not present

## 2018-09-04 DIAGNOSIS — R339 Retention of urine, unspecified: Secondary | ICD-10-CM | POA: Diagnosis present

## 2018-09-04 DIAGNOSIS — E870 Hyperosmolality and hypernatremia: Secondary | ICD-10-CM | POA: Diagnosis not present

## 2018-09-04 DIAGNOSIS — E785 Hyperlipidemia, unspecified: Secondary | ICD-10-CM | POA: Diagnosis present

## 2018-09-04 DIAGNOSIS — Z79890 Hormone replacement therapy (postmenopausal): Secondary | ICD-10-CM

## 2018-09-04 DIAGNOSIS — I10 Essential (primary) hypertension: Secondary | ICD-10-CM | POA: Diagnosis present

## 2018-09-04 DIAGNOSIS — E1010 Type 1 diabetes mellitus with ketoacidosis without coma: Secondary | ICD-10-CM

## 2018-09-04 DIAGNOSIS — R030 Elevated blood-pressure reading, without diagnosis of hypertension: Secondary | ICD-10-CM | POA: Diagnosis present

## 2018-09-04 DIAGNOSIS — Z1159 Encounter for screening for other viral diseases: Secondary | ICD-10-CM | POA: Diagnosis not present

## 2018-09-04 DIAGNOSIS — Z87891 Personal history of nicotine dependence: Secondary | ICD-10-CM | POA: Diagnosis not present

## 2018-09-04 DIAGNOSIS — E1110 Type 2 diabetes mellitus with ketoacidosis without coma: Principal | ICD-10-CM

## 2018-09-04 DIAGNOSIS — E039 Hypothyroidism, unspecified: Secondary | ICD-10-CM | POA: Diagnosis present

## 2018-09-04 DIAGNOSIS — K3184 Gastroparesis: Secondary | ICD-10-CM | POA: Diagnosis present

## 2018-09-04 DIAGNOSIS — Z79891 Long term (current) use of opiate analgesic: Secondary | ICD-10-CM | POA: Diagnosis not present

## 2018-09-04 DIAGNOSIS — R5381 Other malaise: Secondary | ICD-10-CM | POA: Diagnosis not present

## 2018-09-04 DIAGNOSIS — E1143 Type 2 diabetes mellitus with diabetic autonomic (poly)neuropathy: Secondary | ICD-10-CM | POA: Diagnosis present

## 2018-09-04 DIAGNOSIS — E032 Hypothyroidism due to medicaments and other exogenous substances: Secondary | ICD-10-CM | POA: Diagnosis not present

## 2018-09-04 LAB — BASIC METABOLIC PANEL
Anion gap: >20 — ABNORMAL HIGH (ref 5–15)
Anion gap: 17 — ABNORMAL HIGH (ref 5–15)
Anion gap: 13 (ref 5–15)
BUN: 90 mg/dL — ABNORMAL HIGH (ref 6–20)
BUN: 77 mg/dL — ABNORMAL HIGH (ref 6–20)
BUN: 66 mg/dL — ABNORMAL HIGH (ref 6–20)
BUN: 58 mg/dL — ABNORMAL HIGH (ref 6–20)
CO2: 18 mmol/L — ABNORMAL LOW (ref 22–32)
CO2: 16 mmol/L — ABNORMAL LOW (ref 22–32)
CO2: 21 mmol/L — ABNORMAL LOW (ref 22–32)
CO2: 26 mmol/L (ref 22–32)
Calcium: 10.8 mg/dL — ABNORMAL HIGH (ref 8.9–10.3)
Calcium: 8.9 mg/dL (ref 8.9–10.3)
Calcium: 9 mg/dL (ref 8.9–10.3)
Calcium: 9.2 mg/dL (ref 8.9–10.3)
Chloride: 82 mmol/L — ABNORMAL LOW (ref 98–111)
Chloride: 107 mmol/L (ref 98–111)
Chloride: 118 mmol/L — ABNORMAL HIGH (ref 98–111)
Chloride: 120 mmol/L — ABNORMAL HIGH (ref 98–111)
Creatinine, Ser: 4.65 mg/dL — ABNORMAL HIGH (ref 0.44–1.00)
Creatinine, Ser: 3.62 mg/dL — ABNORMAL HIGH (ref 0.44–1.00)
Creatinine, Ser: 2.82 mg/dL — ABNORMAL HIGH (ref 0.44–1.00)
Creatinine, Ser: 2.46 mg/dL — ABNORMAL HIGH (ref 0.44–1.00)
GFR calc Af Amer: 12 mL/min — ABNORMAL LOW (ref >60)
GFR calc Af Amer: 16 mL/min — ABNORMAL LOW (ref >60)
GFR calc Af Amer: 21 mL/min — ABNORMAL LOW (ref >60)
GFR calc Af Amer: 25 mL/min — ABNORMAL LOW (ref >60)
GFR calc non Af Amer: 10 mL/min — ABNORMAL LOW (ref >60)
GFR calc non Af Amer: 14 mL/min — ABNORMAL LOW (ref >60)
GFR calc non Af Amer: 18 mL/min — ABNORMAL LOW (ref >60)
GFR calc non Af Amer: 22 mL/min — ABNORMAL LOW (ref >60)
Glucose, Bld: 1386 mg/dL (ref 70–99)
Glucose, Bld: 845 mg/dL (ref 70–99)
Glucose, Bld: 420 mg/dL — ABNORMAL HIGH (ref 70–99)
Glucose, Bld: 209 mg/dL — ABNORMAL HIGH (ref 70–99)
Potassium: 4.9 mmol/L (ref 3.5–5.1)
Potassium: 3.9 mmol/L (ref 3.5–5.1)
Potassium: 3.5 mmol/L (ref 3.5–5.1)
Potassium: 3.1 mmol/L — ABNORMAL LOW (ref 3.5–5.1)
Sodium: 139 mmol/L (ref 135–145)
Sodium: 149 mmol/L — ABNORMAL HIGH (ref 135–145)
Sodium: 156 mmol/L — ABNORMAL HIGH (ref 135–145)
Sodium: 159 mmol/L — ABNORMAL HIGH (ref 135–145)

## 2018-09-04 LAB — RAPID URINE DRUG SCREEN, HOSP PERFORMED
Amphetamines: NOT DETECTED
Barbiturates: NOT DETECTED
Benzodiazepines: NOT DETECTED
Cocaine: NOT DETECTED
Opiates: NOT DETECTED
Tetrahydrocannabinol: NOT DETECTED

## 2018-09-04 LAB — URINALYSIS, ROUTINE W REFLEX MICROSCOPIC
Bilirubin Urine: NEGATIVE
Glucose, UA: >=500 mg/dL — AB
Ketones, ur: 20 mg/dL — AB
Leukocytes,Ua: NEGATIVE
Nitrite: NEGATIVE
Protein, ur: NEGATIVE mg/dL
Specific Gravity, Urine: 1.022 (ref 1.005–1.030)
pH: 5 (ref 5.0–8.0)

## 2018-09-04 LAB — GLUCOSE, CAPILLARY
Glucose-Capillary: 384 mg/dL — ABNORMAL HIGH (ref 70–99)
Glucose-Capillary: 386 mg/dL — ABNORMAL HIGH (ref 70–99)
Glucose-Capillary: 334 mg/dL — ABNORMAL HIGH (ref 70–99)
Glucose-Capillary: 305 mg/dL — ABNORMAL HIGH (ref 70–99)
Glucose-Capillary: 227 mg/dL — ABNORMAL HIGH (ref 70–99)
Glucose-Capillary: 154 mg/dL — ABNORMAL HIGH (ref 70–99)
Glucose-Capillary: 125 mg/dL — ABNORMAL HIGH (ref 70–99)

## 2018-09-04 LAB — CBC WITH DIFFERENTIAL/PLATELET
Abs Immature Granulocytes: 0.05 10*3/uL (ref 0.00–0.07)
Basophils Absolute: 0 10*3/uL (ref 0.0–0.1)
Basophils Relative: 0 %
Eosinophils Absolute: 0 10*3/uL (ref 0.0–0.5)
Eosinophils Relative: 0 %
HCT: 52.3 % — ABNORMAL HIGH (ref 36.0–46.0)
Hemoglobin: 14.4 g/dL (ref 12.0–15.0)
Immature Granulocytes: 0 %
Lymphocytes Relative: 9 %
Lymphs Abs: 1 10*3/uL (ref 0.7–4.0)
MCH: 26.7 pg (ref 26.0–34.0)
MCHC: 27.5 g/dL — ABNORMAL LOW (ref 30.0–36.0)
MCV: 96.9 fL (ref 80.0–100.0)
Monocytes Absolute: 0.5 10*3/uL (ref 0.1–1.0)
Monocytes Relative: 4 %
Neutro Abs: 10.4 10*3/uL — ABNORMAL HIGH (ref 1.7–7.7)
Neutrophils Relative %: 87 %
Platelets: 297 10*3/uL (ref 150–400)
RBC: 5.4 MIL/uL — ABNORMAL HIGH (ref 3.87–5.11)
RDW: 14.3 % (ref 11.5–15.5)
WBC: 11.9 10*3/uL — ABNORMAL HIGH (ref 4.0–10.5)
nRBC: 0 % (ref 0.0–0.2)

## 2018-09-04 LAB — HEPATIC FUNCTION PANEL
ALT: 21 U/L (ref 0–44)
AST: 16 U/L (ref 15–41)
Albumin: 3.6 g/dL (ref 3.5–5.0)
Alkaline Phosphatase: 107 U/L (ref 38–126)
Bilirubin, Direct: 0.1 mg/dL (ref 0.0–0.2)
Indirect Bilirubin: 1.8 mg/dL — ABNORMAL HIGH (ref 0.3–0.9)
Total Bilirubin: 1.9 mg/dL — ABNORMAL HIGH (ref 0.3–1.2)
Total Protein: 8.3 g/dL — ABNORMAL HIGH (ref 6.5–8.1)

## 2018-09-04 LAB — BETA-HYDROXYBUTYRIC ACID
Beta-Hydroxybutyric Acid: >8 mmol/L — ABNORMAL HIGH (ref 0.05–0.27)
Beta-Hydroxybutyric Acid: >8 mmol/L — ABNORMAL HIGH (ref 0.05–0.27)

## 2018-09-04 LAB — POC URINE PREG, ED: Preg Test, Ur: NEGATIVE

## 2018-09-04 LAB — BLOOD GAS, VENOUS
Acid-base deficit: 8 mmol/L — ABNORMAL HIGH (ref 0.0–2.0)
Bicarbonate: 16.4 mmol/L — ABNORMAL LOW (ref 20.0–28.0)
FIO2: 21
O2 Saturation: 47.7 %
Patient temperature: 36.4
pCO2, Ven: 47.8 mmHg (ref 44.0–60.0)
pH, Ven: 7.211 — ABNORMAL LOW (ref 7.250–7.430)
pO2, Ven: 34.5 mmHg (ref 32.0–45.0)

## 2018-09-04 LAB — ETHANOL: Alcohol, Ethyl (B): <10 mg/dL (ref <10)

## 2018-09-04 LAB — SALICYLATE LEVEL: Salicylate Lvl: <7 mg/dL (ref 2.8–30.0)

## 2018-09-04 LAB — MAGNESIUM: Magnesium: 3.9 mg/dL — ABNORMAL HIGH (ref 1.7–2.4)

## 2018-09-04 LAB — SARS CORONAVIRUS 2 BY RT PCR (HOSPITAL ORDER, PERFORMED IN ~~LOC~~ HOSPITAL LAB): SARS Coronavirus 2: NEGATIVE

## 2018-09-04 LAB — CBG MONITORING, ED
Glucose-Capillary: >600 mg/dL (ref 70–99)
Glucose-Capillary: >600 mg/dL (ref 70–99)
Glucose-Capillary: >600 mg/dL (ref 70–99)
Glucose-Capillary: >600 mg/dL (ref 70–99)
Glucose-Capillary: 597 mg/dL (ref 70–99)

## 2018-09-04 LAB — MRSA PCR SCREENING: MRSA by PCR: NEGATIVE

## 2018-09-04 LAB — ACETAMINOPHEN LEVEL: Acetaminophen (Tylenol), Serum: <10 ug/mL — ABNORMAL LOW (ref 10–30)

## 2018-09-04 MED ORDER — METOPROLOL TARTRATE 25 MG PO TABS
25.0000 mg | ORAL_TABLET | Freq: Every day | ORAL | Status: DC
  Administered 2018-09-05 – 2018-09-07 (×3): 25 mg via ORAL
  Filled 2018-09-04 (×3): qty 1

## 2018-09-04 MED ORDER — POTASSIUM CHLORIDE 10 MEQ/100ML IV SOLN
10.0000 meq | INTRAVENOUS | Status: AC
  Administered 2018-09-04 (×2): 10 meq via INTRAVENOUS
  Filled 2018-09-04: qty 100

## 2018-09-04 MED ORDER — LEVOTHYROXINE SODIUM 88 MCG PO TABS
88.0000 ug | ORAL_TABLET | Freq: Every day | ORAL | Status: DC
  Administered 2018-09-05 – 2018-09-07 (×3): 88 ug via ORAL
  Filled 2018-09-04 (×5): qty 1

## 2018-09-04 MED ORDER — POTASSIUM CHLORIDE IN NACL 20-0.45 MEQ/L-% IV SOLN
INTRAVENOUS | Status: DC
  Administered 2018-09-04 – 2018-09-05 (×2): via INTRAVENOUS
  Filled 2018-09-04 (×3): qty 1000

## 2018-09-04 MED ORDER — INSULIN REGULAR(HUMAN) IN NACL 100-0.9 UT/100ML-% IV SOLN
INTRAVENOUS | Status: DC
  Administered 2018-09-04: 18:00:00 16.3 [IU]/h via INTRAVENOUS
  Administered 2018-09-04: 5.4 [IU]/h via INTRAVENOUS
  Filled 2018-09-04: qty 100

## 2018-09-04 MED ORDER — DEXTROSE-NACL 5-0.45 % IV SOLN: INTRAVENOUS | Status: DC

## 2018-09-04 MED ORDER — ACETAMINOPHEN 325 MG PO TABS: 650.0000 mg | ORAL_TABLET | Freq: Four times a day (QID) | ORAL | Status: DC | PRN

## 2018-09-04 MED ORDER — SODIUM CHLORIDE 0.9 % IV BOLUS
1000.0000 mL | Freq: Once | INTRAVENOUS | Status: AC
  Administered 2018-09-04: 1000 mL via INTRAVENOUS

## 2018-09-04 MED ORDER — SODIUM CHLORIDE 0.9 % IV SOLN: INTRAVENOUS | Status: DC

## 2018-09-04 MED ORDER — SODIUM CHLORIDE 0.9 % IV SOLN
INTRAVENOUS | Status: DC
  Administered 2018-09-04 (×2): via INTRAVENOUS

## 2018-09-04 MED ORDER — INSULIN REGULAR(HUMAN) IN NACL 100-0.9 UT/100ML-% IV SOLN
INTRAVENOUS | Status: DC
  Filled 2018-09-04: qty 100

## 2018-09-04 MED ORDER — POTASSIUM CHLORIDE 10 MEQ/100ML IV SOLN
10.0000 meq | INTRAVENOUS | Status: AC
  Administered 2018-09-04: 10 meq via INTRAVENOUS
  Filled 2018-09-04 (×2): qty 100

## 2018-09-04 MED ORDER — HEPARIN SODIUM (PORCINE) 5000 UNIT/ML IJ SOLN
5000.0000 [IU] | Freq: Three times a day (TID) | INTRAMUSCULAR | Status: DC
  Administered 2018-09-04 – 2018-09-07 (×9): 5000 [IU] via SUBCUTANEOUS
  Filled 2018-09-04 (×10): qty 1

## 2018-09-04 MED ORDER — PANTOPRAZOLE SODIUM 40 MG PO TBEC
40.0000 mg | DELAYED_RELEASE_TABLET | Freq: Every day | ORAL | Status: DC
  Administered 2018-09-04 – 2018-09-07 (×4): 40 mg via ORAL
  Filled 2018-09-04 (×4): qty 1

## 2018-09-04 MED ORDER — SODIUM CHLORIDE 0.9 % IV SOLN
INTRAVENOUS | Status: AC
  Administered 2018-09-04 (×2): via INTRAVENOUS

## 2018-09-04 MED ORDER — DEXTROSE-NACL 5-0.45 % IV SOLN
INTRAVENOUS | Status: DC
  Administered 2018-09-04: 20:00:00 via INTRAVENOUS

## 2018-09-04 MED ORDER — ONDANSETRON HCL 4 MG/2ML IJ SOLN: 4.0000 mg | Freq: Four times a day (QID) | INTRAMUSCULAR | Status: DC | PRN

## 2018-09-04 NOTE — ED Notes (Signed)
CRITICAL VALUE ALERT  Critical Value:  Glucose 1386  Date & Time Notied:  09/04/2018, 1105  Provider Notified: Dr Sabra Heck  Orders Received/Actions taken: see chart

## 2018-09-04 NOTE — ED Triage Notes (Signed)
Per EMS pt dx with diabetes on Friday and started on lantus. Pt not eating x one day. Lethargic this am. EMS reports blood sugar 497.  Seen here yesterday

## 2018-09-04 NOTE — ED Provider Notes (Signed)
[facility] EMERGENCY DEPARTMENT Provider Note   CSN: [id number] Arrival date & time: 09/04/18  1829    History   Chief Complaint Chief Complaint  Patient presents with  . Hyperglycemia    HPI AUDEN WETTSTEIN is a 53 y.o. female.     HPI  The patient is a 53 year old female, she has a known history of high blood pressure, Graves' disease, apparently the patient was diagnosed recently with diabetes as well although this information is not available in the chart.  According to a doctor visit from Aug 30, 2018 she did not have any diabetes medications listed at that time.  According to the paramedics were the primary historians as the patient has altered mental status she has had a progressive lethargy over the last 24 hours, recently started on Lantus insulin but has not eaten in 24 hours and according to their blood sugar measured very high.  The patient is states over and over "I am dehydrated".  She does not answer any other questions, level 5 caveat applies.  Past Medical History:  Diagnosis Date  . Grave's disease    diagnosed years ago/ now hypothyroidism  . Hypertension   . IBS (irritable colon syndrome)    constipation predominant  . Nondiabetic gastroparesis     Patient Active Problem List   Diagnosis Date Noted  . Cystocele, midline 10/10/2017  . Vaginal dryness 10/10/2017  . Screening for colorectal cancer 10/10/2017  . Encounter for well woman exam with routine gynecological exam 10/10/2017  . Abnormal CT scan, neck 06/14/2017  . Odynophagia 01/10/2017  . Neck pain on right side 01/10/2017  . Abdominal pain, epigastric 02/09/2016  . Colon cancer screening   . Mucosal abnormality of stomach   . Dysphagia, pharyngoesophageal phase   . Esophageal dysphagia 10/15/2014  . Chronic fatigue 04/10/2014  . Encounter for screening colonoscopy 09/05/2013  . Chest wall pain 09/11/2012  . Hypothyroidism 11/18/2011  . Obesity, unspecified 11/18/2011  . GERD 12/05/2008   . GASTROPARESIS 04/03/2008  . CONSTIPATION, CHRONIC 04/03/2008    Past Surgical History:  Procedure Laterality Date  . ABDOMINAL HYSTERECTOMY    . attempted colonoscopy  03/2004   Dr. Rourk--> prep was poor. Exam to 40 cm. No gross abnormalities. Internal hemorrhoids. Air contrast barium enema was normal.  . CHOLECYSTECTOMY    . COLONOSCOPY N/A 10/30/2014   RMR: normal  . ESOPHAGEAL DILATION N/A 10/30/2014   Procedure: ESOPHAGEAL DILATION;  Surgeon: Daneil Dolin, MD;  Location: [facility] SU[facility];  Service: Endoscopy;  Laterality: N/A;  . ESOPHAGOGASTRODUODENOSCOPY  01/2004   Dr. Almyra Free small polyps in postbulbar area ablated  . ESOPHAGOGASTRODUODENOSCOPY N/A 10/30/2014   HBZ:[id number]  . S/P Hysterectomy     . TONSILLECTOMY    . TUBAL LIGATION       OB History    Gravida  1   Para  1   Term  1   Preterm      AB      Living  1     SAB      TAB      Ectopic      Multiple      Live Births  1            Home Medications    Prior to Admission medications   Medication Sig Start Date End Date Taking? Authorizing Provider  acetaminophen (TYLENOL) 500 MG tablet Take 1,000 mg by mouth every 6 (six) hours as needed for headache.  Yes [provider]  atorvastatin (LIPITOR) 20 MG tablet Take 20 mg by mouth daily.   Yes [provider]  cetirizine (ZYRTEC) 10 MG tablet Take 10 mg by mouth daily.   Yes [provider]  estradiol (ESTRACE) 1 MG tablet Take 1 tablet (1 mg total) by mouth daily. 03/27/18  Yes Derrek Monaco A, NP  HYDROcodone-acetaminophen (NORCO/VICODIN) 5-325 MG tablet Take 1 tablet by mouth every 6 (six) hours as needed. 09/03/18  Yes Fredia Sorrow, MD  insulin glargine (LANTUS) 100 UNIT/ML injection Inject 20 Units into the skin at bedtime.   Yes [provider]  levothyroxine (SYNTHROID) 88 MCG tablet Take 88 mcg by mouth daily. 08/05/18  Yes [provider]  metoCLOPramide (REGLAN) 5 MG tablet Take 0.5  tablets by mouth 4 (four) times daily -  before meals and at bedtime. 02/14/18  Yes [provider]  metoprolol tartrate (LOPRESSOR) 25 MG tablet Take 25 mg by mouth daily.    Yes [provider]  ondansetron (ZOFRAN ODT) 4 MG disintegrating tablet Take 1 tablet (4 mg total) by mouth every 8 (eight) hours as needed. 09/03/18  Yes Fredia Sorrow, MD  pantoprazole (PROTONIX) 40 MG tablet Take 1 tablet (40 mg total) by mouth daily. Patient taking differently: Take 40 mg by mouth 2 (two) times daily.  10/25/17  Yes Rourk, Cristopher Estimable, MD  conjugated estrogens (PREMARIN) vaginal cream Use 0.5 gm in vagina daily at bedtime for 2 weeks then 2-3 x weekly Patient not taking: Reported on 07/25/2018 02/13/18   Estill Dooms, NP  dexamethasone (DECADRON) 4 MG tablet Take 1 tablet (4 mg total) by mouth 2 (two) times daily with a meal. Patient not taking: Reported on 09/04/2018 06/06/18   Lily Kocher, PA-C  fluconazole (DIFLUCAN) 150 MG tablet Take 1 now and repeat 1 in 3 days if needed Patient not taking: Reported on 05/13/2018 03/15/18   Estill Dooms, NP  hydrochlorothiazide (HYDRODIURIL) 25 MG tablet Take 25 mg by mouth daily. 09/29/17   [provider]  HYDROcodone-acetaminophen (NORCO/VICODIN) 5-325 MG tablet 1 or 2 tabs PO q8 hours prn pain Patient not taking: Reported on 09/04/2018 05/13/18   Francine Graven, DO  HYDROcodone-homatropine [facility]) 5-1.5 MG/5ML syrup Take 5 mLs by mouth every 6 (six) hours as needed. Patient not taking: Reported on 09/04/2018 06/06/18   Lily Kocher, PA-C  methocarbamol (ROBAXIN) 500 MG tablet Take 2 tablets (1,000 mg total) by mouth 4 (four) times daily as needed for muscle spasms (muscle spasm/pain). Patient not taking: Reported on 09/04/2018 05/13/18   Francine Graven, DO  metoCLOPramide (REGLAN) 5 MG tablet Take 1/2 tab(2.5 mg) bid daily as directed. Patient not taking: Reported on 09/04/2018 08/16/18   Daneil Dolin, MD  ondansetron  (ZOFRAN-ODT) 4 MG disintegrating tablet Take 1 tablet (4 mg total) by mouth every 6 (six) hours as needed for nausea or vomiting. Patient not taking: Reported on 09/04/2018 08/09/18   Mahala Menghini, PA-C    Family History Family History  Problem Relation Age of Onset  . Other Father 41       hit by train  . Heart disease Mother   . Kidney disease Mother   . Colon cancer Neg Hx     Social History Social History   Tobacco Use  . Smoking status: Former Smoker    Types: E-cigarettes  . Smokeless tobacco: Never Used  Substance Use Topics  . Alcohol use: Yes    Comment: occ  .  Drug use: No     Allergies   Codeine   Review of Systems Review of Systems  All other systems reviewed and are negative.    Physical Exam Updated Vital Signs BP (!) 152/70 (BP Location: Left Arm)   Pulse 96   Temp 97.6 F (36.4 C) (Oral)   Resp 16   Ht 1.651 m (5\' 5" )   Wt 74.8 kg   SpO2 95%   BMI 27.46 kg/m   Physical Exam Vitals signs and nursing note reviewed.  Constitutional:      Appearance: She is well-developed.     Comments: Somnolent but arousable  HENT:     Head: Normocephalic and atraumatic.     Mouth/Throat:     Mouth: Mucous membranes are dry.     Pharynx: No oropharyngeal exudate.  Eyes:     General: No scleral icterus.       Right eye: No discharge.        Left eye: No discharge.     Conjunctiva/sclera: Conjunctivae normal.     Pupils: Pupils are equal, round, and reactive to light.  Neck:     Musculoskeletal: Normal range of motion and neck supple.     Thyroid: No thyromegaly.     Vascular: No JVD.  Cardiovascular:     Rate and Rhythm: Normal rate and regular rhythm.     Heart sounds: Normal heart sounds. No murmur. No friction rub. No gallop.   Pulmonary:     Effort: Pulmonary effort is normal. No respiratory distress.     Breath sounds: Normal breath sounds. No wheezing or rales.  Abdominal:     General: Bowel sounds are normal. There is no distension.      Palpations: Abdomen is soft. There is no mass.     Tenderness: There is abdominal tenderness.     Comments: Mild diffuse abdominal tenderness without guarding or peritoneal signs  Musculoskeletal: Normal range of motion.        General: No tenderness.  Lymphadenopathy:     Cervical: No cervical adenopathy.  Skin:    General: Skin is warm and dry.     Findings: No erythema or rash.  Neurological:     Coordination: Coordination normal.     Comments: The patient has the ability to follow commands, she does this slowly, she has no obvious ataxia or facial droop, her speech is slightly slurred and slow.  She has equal grips, she appears confused and does not answer all my questions appropriately, she continues to say the same thing over and over, "I am dehydrated"  Psychiatric:        Behavior: Behavior normal.      ED Treatments / Results  Labs (all labs ordered are listed, but only abnormal results are displayed) Labs Reviewed  BASIC METABOLIC PANEL - Abnormal; Notable for the following components:      Result Value   Chloride 82 (*)    CO2 18 (*)    Glucose, Bld 1,386 (*)    BUN 90 (*)    Creatinine, Ser 4.65 (*)    Calcium 10.8 (*)    GFR calc non Af Amer 10 (*)    GFR calc Af Amer 12 (*)    All other components within normal limits  CBC WITH DIFFERENTIAL/PLATELET - Abnormal; Notable for the following components:   WBC 11.9 (*)    RBC 5.40 (*)    HCT 52.3 (*)    MCHC 27.5 (*)  Neutro Abs 10.4 (*)    All other components within normal limits  BETA-HYDROXYBUTYRIC ACID - Abnormal; Notable for the following components:   Beta-Hydroxybutyric Acid >8.00 (*)    All other components within normal limits  ACETAMINOPHEN LEVEL - Abnormal; Notable for the following components:   Acetaminophen (Tylenol), Serum <10 (*)    All other components within normal limits  CBG MONITORING, ED - Abnormal; Notable for the following components:   Glucose-Capillary >600 (*)    All other  components within normal limits  URINE CULTURE  ETHANOL  SALICYLATE LEVEL  BLOOD GAS, VENOUS  URINALYSIS, ROUTINE W REFLEX MICROSCOPIC  RAPID URINE DRUG SCREEN, HOSP PERFORMED  HEPATIC FUNCTION PANEL  POC URINE PREG, ED    EKG None  Radiology Dg Foot Complete Right  Result Date: 09/03/2018 CLINICAL DATA:  Acute RIGHT foot pain. No known injury. Initial encounter. EXAM: RIGHT FOOT COMPLETE - 3+ VIEW COMPARISON:  None. FINDINGS: No acute fracture, subluxation or dislocation. No focal bony lesions are identified. The Lisfranc joints and calcaneus are unremarkable. IMPRESSION: Negative. Electronically Signed   By: Margarette Canada M.D.   On: 09/03/2018 12:50   Dg Hip Unilat With Pelvis 2-3 Views Right  Result Date: 09/03/2018 CLINICAL DATA:  RIGHT hip pain.  Difficulty walking. EXAM: DG HIP (WITH OR WITHOUT PELVIS) 2-3V RIGHT COMPARISON:  None. FINDINGS: Hips are located. No pelvic fracture or sacral fracture. Dedicated views of the RIGHT hip demonstrate no fracture. Mild sclerosis of the acetabular roofs. IMPRESSION: Mild osteoarthritis of the hips.  No acute findings. Electronically Signed   By: Suzy Bouchard M.D.   On: 09/03/2018 10:26    Procedures .Critical Care Performed by: Noemi Chapel, MD Authorized by: Noemi Chapel, MD   Critical care provider statement:    Critical care time (minutes):  35   Critical care time was exclusive of:  Separately billable procedures and treating other patients and teaching time   Critical care was necessary to treat or prevent imminent or life-threatening deterioration of the following conditions:  Renal failure and endocrine crisis   Critical care was time spent personally by me on the following activities:  Blood draw for specimens, development of treatment plan with patient or surrogate, discussions with consultants, evaluation of patient's response to treatment, examination of patient, obtaining history from patient or surrogate, ordering and  performing treatments and interventions, ordering and review of laboratory studies, ordering and review of radiographic studies, pulse oximetry, re-evaluation of patient's condition and review of old charts   (including critical care time)  Medications Ordered in ED Medications  insulin regular, human (MYXREDLIN) 100 units/ 100 mL infusion (5.4 Units/hr Intravenous New Bag/Given 09/04/18 1048)  sodium chloride 0.9 % bolus 1,000 mL (0 mLs Intravenous Stopped 09/04/18 1126)    And  sodium chloride 0.9 % bolus 1,000 mL (1,000 mLs Intravenous New Bag/Given 09/04/18 1127)    And  0.9 %  sodium chloride infusion (has no administration in time range)  dextrose 5 %-0.45 % sodium chloride infusion (has no administration in time range)  potassium chloride 10 mEq in 100 mL IVPB (10 mEq Intravenous New Bag/Given 09/04/18 1135)     Initial Impression / Assessment and Plan / ED Course  I have reviewed the triage vital signs and the nursing notes.  Pertinent labs & imaging results that were available during my care of the patient were reviewed by me and considered in my medical decision making (see chart for details).  Clinical Course as of May  [place] Sep 04, 2018  1047 The anion gap is approximately 59 raising suspicion for diabetic ketoacidosis.  Her creatinine is 4.65.  I have reviewed her prior medical records and this appears to be significantly elevated from her baseline of 1.3.  I suspect this is in someway related to dehydration.  Will obtain urinalysis by in and out catheterization, insulin drip will be started, her potassium is 4.9, white blood cell count is 11,900 and there is no anemia.  Placed on an insulin drip, she is critically ill and will need to be admitted.   [BM]  1105 The patient's work-up shows significant abnormalities in her lab work, her blood sugar has returned at 1386.   [BM]    Clinical Course User Index [BM] Noemi Chapel, MD       The patient does not have any  acute findings on exam to suggest a cause of her severe hyperglycemia.  This may be related to being a new diabetic and noncompliance or just the honeymoon.  Not going well after diagnosis.  She is not tachycardic or hypotensive however she appears severely dehydrated and with her altered mental status will need further evaluation with labs, drug screen, IV fluid resuscitation and likely insulin.  She may be in diabetic ketoacidosis.  The patient's ketoacidosis is being treated with a insulin drip.  She has some urinary retention with almost 1 L of urine in the bladder.  This is been drained with in and out catheterization.  I would consider that this could possibly potentially be a postobstructive uropathy as well.  Consulted hospitalist for admission 11:25 AM Called back - will admit  Final Clinical Impressions(s) / ED Diagnoses   Final diagnoses:  Diabetic ketoacidosis without coma associated with type 1 diabetes mellitus ([place])  Acute renal failure, unspecified acute renal failure type ([facility])      Noemi Chapel, MD 09/04/18 1136

## 2018-09-04 NOTE — H&P (Signed)
History and Physical    [name] [name] DTH:[id number] DOB: 11/21/1965 DOA: 09/04/2018  PCP: Royce Macadamia D., PA-C   Patient coming from: Home  Chief Complaint: AMS  HPI: [name] [name] is a 53 y.o. female with medical history significant for hypertension, Graves' disease/hypothyroidism, IBS, gastroparesis, dyslipidemia, and questionable recent diagnosis of diabetes who was brought to the ED via EMS with altered mental status and progressive lethargy over the last 24 hours.  Patient was recently started on Lantus, but has not eaten in the last 24 hours and blood glucose was measured to be quite elevated.  Patient is somewhat somnolent and difficult to obtain history from, but was complaining of some dehydration earlier in the ED visit.  She denies any abdominal pain, nausea, vomiting, fevers, or chills.   ED Course: Patient noted to have a blood glucose of 1386 on labs with signs of acidosis.  She was also noted to have some urinary retention and had in and out catheterization with 800 cc of urine removed at that time and urine analysis is currently pending.  She is noted to have signs of AKI with BUN of 90 and creatinine 4.65.  She was recently seen for right lower extremity pain and was not noted to have any acute abnormalities and was sent home with pain medications just yesterday.  She has been started on IV fluid and insulin drip.  Review of Systems: Very difficult to obtain given patient condition.  Past Medical History:  Diagnosis Date   Grave's disease    diagnosed years ago/ now hypothyroidism   Hypertension    IBS (irritable colon syndrome)    constipation predominant   Nondiabetic gastroparesis     Past Surgical History:  Procedure Laterality Date   ABDOMINAL HYSTERECTOMY     attempted colonoscopy  03/2004   Dr. Rourk--> prep was poor. Exam to 40 cm. No gross abnormalities. Internal hemorrhoids. Air contrast barium enema was normal.   CHOLECYSTECTOMY      COLONOSCOPY N/A 10/30/2014   RMR: normal   ESOPHAGEAL DILATION N/A 10/30/2014   Procedure: ESOPHAGEAL DILATION;  Surgeon: Daneil Dolin, MD;  Location: [facility];  Service: Endoscopy;  Laterality: N/A;   ESOPHAGOGASTRODUODENOSCOPY  01/2004   Dr. Almyra Free small polyps in postbulbar area ablated   ESOPHAGOGASTRODUODENOSCOPY N/A 10/30/2014   JKQ:ASUORV   S/P Hysterectomy      TONSILLECTOMY     TUBAL LIGATION       reports that she has quit smoking. Her smoking use included e-cigarettes. She has never used smokeless tobacco. She reports current alcohol use. She reports that she does not use drugs.  Allergies  Allergen Reactions   Codeine Nausea And Vomiting    Family History  Problem Relation Age of Onset   Other Father 56       hit by train   Heart disease Mother    Kidney disease Mother    Colon cancer Neg Hx     Prior to Admission medications   Medication Sig Start Date End Date Taking? Authorizing Provider  acetaminophen (TYLENOL) 500 MG tablet Take 1,000 mg by mouth every 6 (six) hours as needed for headache.   Yes [provider]  atorvastatin (LIPITOR) 20 MG tablet Take 20 mg by mouth daily.   Yes [provider]  cetirizine (ZYRTEC) 10 MG tablet Take 10 mg by mouth daily.   Yes [provider]  estradiol (ESTRACE) 1 MG tablet Take 1 tablet (1 mg total) by mouth  daily. 03/27/18  Yes Estill Dooms, NP  HYDROcodone-acetaminophen (NORCO/VICODIN) 5-325 MG tablet Take 1 tablet by mouth every 6 (six) hours as needed. 09/03/18  Yes Fredia Sorrow, MD  insulin glargine (LANTUS) 100 UNIT/ML injection Inject 20 Units into the skin at bedtime.   Yes [provider]  levothyroxine (SYNTHROID) 88 MCG tablet Take 88 mcg by mouth daily. 08/05/18  Yes [provider]  metoCLOPramide (REGLAN) 5 MG tablet Take 0.5 tablets by mouth 4 (four) times daily -  before meals and at bedtime. 02/14/18  Yes [provider]    metoprolol tartrate (LOPRESSOR) 25 MG tablet Take 25 mg by mouth daily.    Yes [provider]  ondansetron (ZOFRAN ODT) 4 MG disintegrating tablet Take 1 tablet (4 mg total) by mouth every 8 (eight) hours as needed. 09/03/18  Yes Fredia Sorrow, MD  pantoprazole (PROTONIX) 40 MG tablet Take 1 tablet (40 mg total) by mouth daily. Patient taking differently: Take 40 mg by mouth 2 (two) times daily.  10/25/17  Yes Rourk, Cristopher Estimable, MD  conjugated estrogens (PREMARIN) vaginal cream Use 0.5 gm in vagina daily at bedtime for 2 weeks then 2-3 x weekly Patient not taking: Reported on 07/25/2018 02/13/18   Estill Dooms, NP  dexamethasone (DECADRON) 4 MG tablet Take 1 tablet (4 mg total) by mouth 2 (two) times daily with a meal. Patient not taking: Reported on 09/04/2018 06/06/18   Lily Kocher, PA-C  fluconazole (DIFLUCAN) 150 MG tablet Take 1 now and repeat 1 in 3 days if needed Patient not taking: Reported on 05/13/2018 03/15/18   Estill Dooms, NP  hydrochlorothiazide (HYDRODIURIL) 25 MG tablet Take 25 mg by mouth daily. 09/29/17   [provider]  HYDROcodone-acetaminophen (NORCO/VICODIN) 5-325 MG tablet 1 or 2 tabs PO q8 hours prn pain Patient not taking: Reported on 09/04/2018 05/13/18   Francine Graven, DO  HYDROcodone-homatropine [facility]) 5-1.5 MG/5ML syrup Take 5 mLs by mouth every 6 (six) hours as needed. Patient not taking: Reported on 09/04/2018 06/06/18   Lily Kocher, PA-C  methocarbamol (ROBAXIN) 500 MG tablet Take 2 tablets (1,000 mg total) by mouth 4 (four) times daily as needed for muscle spasms (muscle spasm/pain). Patient not taking: Reported on 09/04/2018 05/13/18   Francine Graven, DO  metoCLOPramide (REGLAN) 5 MG tablet Take 1/2 tab(2.5 mg) bid daily as directed. Patient not taking: Reported on 09/04/2018 08/16/18   Daneil Dolin, MD  ondansetron (ZOFRAN-ODT) 4 MG disintegrating tablet Take 1 tablet (4 mg total) by mouth every 6 (six) hours as needed for  nausea or vomiting. Patient not taking: Reported on 09/04/2018 08/09/18   Mahala Menghini, PA-C    Physical Exam: Vitals:   09/04/18 1003 09/04/18 1005 09/04/18 1105  BP:  121/81 (!) 152/70  Pulse:  97 96  Resp:  20 16  Temp:  97.6 F (36.4 C)   TempSrc:  Oral   SpO2:   95%  Weight: 74.8 kg    Height: 5\' 5"  (1.651 m)      Constitutional: Somnolent but arousable Vitals:   09/04/18 1003 09/04/18 1005 09/04/18 1105  BP:  121/81 (!) 152/70  Pulse:  97 96  Resp:  20 16  Temp:  97.6 F (36.4 C)   TempSrc:  Oral   SpO2:   95%  Weight: 74.8 kg    Height: 5\' 5"  (1.651 m)     Eyes: lids and conjunctivae normal ENMT: Mucous membranes are moist.  Neck: normal, supple Respiratory:  clear to auscultation bilaterally. Normal respiratory effort. No accessory muscle use.  Cardiovascular: Regular rate and rhythm, no murmurs. No extremity edema. Abdomen: no tenderness, no distention. Bowel sounds positive.  Musculoskeletal:  No joint deformity upper and lower extremities.   Skin: no rashes, lesions, ulcers.  Psychiatric: Cannot be determined  Labs on Admission: I have personally reviewed following labs and imaging studies  CBC: Recent Labs  Lab 09/04/18 1007  WBC 11.9*  NEUTROABS 10.4*  HGB 14.4  HCT 52.3*  MCV 96.9  PLT 314   Basic Metabolic Panel: Recent Labs  Lab 09/04/18 1007  NA 139  K 4.9  CL 82*  CO2 18*  GLUCOSE 1,386*  BUN 90*  CREATININE 4.65*  CALCIUM 10.8*   GFR: Estimated Creatinine Clearance: 14.2 mL/min (A) (by C-G formula based on SCr of 4.65 mg/dL (H)). Liver Function Tests: Recent Labs  Lab 09/04/18 1125  AST 16  ALT 21  ALKPHOS 107  BILITOT 1.9*  PROT 8.3*  ALBUMIN 3.6   No results for input(s): LIPASE, AMYLASE in the last 168 hours. No results for input(s): AMMONIA in the last 168 hours. Coagulation Profile: No results for input(s): INR, PROTIME in the last 168 hours. Cardiac Enzymes: No results for input(s): CKTOTAL, CKMB, CKMBINDEX,  TROPONINI in the last 168 hours. BNP (last 3 results) No results for input(s): PROBNP in the last 8760 hours. HbA1C: No results for input(s): HGBA1C in the last 72 hours. CBG: Recent Labs  Lab 09/04/18 1001 09/04/18 1146  GLUCAP >600* >600*   Lipid Profile: No results for input(s): CHOL, HDL, LDLCALC, TRIG, CHOLHDL, LDLDIRECT in the last 72 hours. Thyroid Function Tests: No results for input(s): TSH, T4TOTAL, FREET4, T3FREE, THYROIDAB in the last 72 hours. Anemia Panel: No results for input(s): VITAMINB12, FOLATE, FERRITIN, TIBC, IRON, RETICCTPCT in the last 72 hours. Urine analysis:    Component Value Date/Time   COLORURINE YELLOW 09/04/2018 1103   APPEARANCEUR CLEAR 09/04/2018 1103   LABSPEC 1.022 09/04/2018 1103   PHURINE 5.0 09/04/2018 1103   GLUCOSEU >=500 (A) 09/04/2018 1103   HGBUR SMALL (A) 09/04/2018 1103   BILIRUBINUR NEGATIVE 09/04/2018 1103   KETONESUR 20 (A) 09/04/2018 1103   PROTEINUR NEGATIVE 09/04/2018 1103   UROBILINOGEN 0.2 12/02/2014 1224   NITRITE NEGATIVE 09/04/2018 1103   LEUKOCYTESUR NEGATIVE 09/04/2018 1103    Radiological Exams on Admission: Dg Foot Complete Right  Result Date: 09/03/2018 CLINICAL DATA:  Acute RIGHT foot pain. No known injury. Initial encounter. EXAM: RIGHT FOOT COMPLETE - 3+ VIEW COMPARISON:  None. FINDINGS: No acute fracture, subluxation or dislocation. No focal bony lesions are identified. The Lisfranc joints and calcaneus are unremarkable. IMPRESSION: Negative. Electronically Signed   By: Margarette Canada M.D.   On: 09/03/2018 12:50   Dg Hip Unilat With Pelvis 2-3 Views Right  Result Date: 09/03/2018 CLINICAL DATA:  RIGHT hip pain.  Difficulty walking. EXAM: DG HIP (WITH OR WITHOUT PELVIS) 2-3V RIGHT COMPARISON:  None. FINDINGS: Hips are located. No pelvic fracture or sacral fracture. Dedicated views of the RIGHT hip demonstrate no fracture. Mild sclerosis of the acetabular roofs. IMPRESSION: Mild osteoarthritis of the hips.  No acute  findings. Electronically Signed   By: Suzy Bouchard M.D.   On: 09/03/2018 10:26    EKG: Independently reviewed.  Sinus rhythm, 97 bpm.  Assessment/Plan Principal Problem:   DKA (diabetic ketoacidoses) (HCC) Active Problems:   Gastroparesis   Hypothyroidism   AKI (acute kidney injury) ([place])    1. Acute encephalopathy secondary to  DKA in the setting of newly diagnosed diabetes.  Will maintain on DKA protocol with insulin drip and monitor repeat labs every 4 hours and make adjustments as needed with transition to D5 IV fluid once blood glucose less than 250.  Will anticipate starting on carb modified/heart healthy diet once anion gap closes.  Placed on Zofran as needed for nausea or vomiting and check hemoglobin A1c.  Monitor in stepdown unit carefully with neurochecks.  Await UA results. 2. AKI.  Likely secondary to above versus some postobstructive process.  Will continue in and out catheterizations as needed and monitor repeat lab work for improvement on IV fluid, otherwise check renal ultrasound. 3. Hypothyroidism.  Maintain on Synthroid. 4. Gastroparesis.  Hold Reglan for now and reinitiate once diet is started and monitor symptoms. 5. Hypertension.  Maintain on home blood pressure medications and monitor.   DVT prophylaxis: Heparin Code Status: Full Family Communication: We will call husband Disposition Plan: Admit for IV insulin and hydration Consults called: None Admission status: Inpatient, stepdown   Andretta Ergle Darleen Crocker DO [facility] Pager [phone]  If 7PM-7AM, please contact night-coverage [email or web address] Password [facility]  09/04/2018, 12:03 PM

## 2018-09-04 NOTE — ED Notes (Signed)
CRITICAL VALUE ALERT  Critical Value:  Glucose 845  Date & Time Notied:  09/04/2018, 5176  Provider Notified: Dr. Sabra Heck  Orders Received/Actions taken:see chart

## 2018-09-04 NOTE — ED Notes (Signed)
EMS says husband noticed pt woke up lethargic this morning.  Pt responds to verbal stimuli but goes to sleep if not stimulated.  Pt oriented x 3, denies any pain.

## 2018-09-05 LAB — LACTIC ACID, PLASMA: Lactic Acid, Venous: 2.2 mmol/L (ref 0.5–1.9)

## 2018-09-05 LAB — CBC
HCT: 43 % (ref 36.0–46.0)
Hemoglobin: 12.8 g/dL (ref 12.0–15.0)
MCH: 26.6 pg (ref 26.0–34.0)
MCHC: 29.8 g/dL — ABNORMAL LOW (ref 30.0–36.0)
MCV: 89.4 fL (ref 80.0–100.0)
Platelets: 234 10*3/uL (ref 150–400)
RBC: 4.81 MIL/uL (ref 3.87–5.11)
RDW: 13.2 % (ref 11.5–15.5)
WBC: 17.9 10*3/uL — ABNORMAL HIGH (ref 4.0–10.5)
nRBC: 0 % (ref 0.0–0.2)

## 2018-09-05 LAB — GLUCOSE, CAPILLARY
Glucose-Capillary: 112 mg/dL — ABNORMAL HIGH (ref 70–99)
Glucose-Capillary: 118 mg/dL — ABNORMAL HIGH (ref 70–99)
Glucose-Capillary: 242 mg/dL — ABNORMAL HIGH (ref 70–99)
Glucose-Capillary: 258 mg/dL — ABNORMAL HIGH (ref 70–99)
Glucose-Capillary: 292 mg/dL — ABNORMAL HIGH (ref 70–99)
Glucose-Capillary: 99 mg/dL (ref 70–99)

## 2018-09-05 LAB — BASIC METABOLIC PANEL
Anion gap: 14 (ref 5–15)
BUN: 49 mg/dL — ABNORMAL HIGH (ref 6–20)
CO2: 26 mmol/L (ref 22–32)
Calcium: 8.9 mg/dL (ref 8.9–10.3)
Chloride: 121 mmol/L — ABNORMAL HIGH (ref 98–111)
Creatinine, Ser: 2.18 mg/dL — ABNORMAL HIGH (ref 0.44–1.00)
GFR calc Af Amer: 29 mL/min — ABNORMAL LOW (ref >60)
GFR calc non Af Amer: 25 mL/min — ABNORMAL LOW (ref >60)
Glucose, Bld: 193 mg/dL — ABNORMAL HIGH (ref 70–99)
Potassium: 3.6 mmol/L (ref 3.5–5.1)
Sodium: 161 mmol/L (ref 135–145)

## 2018-09-05 LAB — HEMOGLOBIN A1C
Hgb A1c MFr Bld: >15.5 % — ABNORMAL HIGH (ref 4.8–5.6)
Mean Plasma Glucose: >398 mg/dL

## 2018-09-05 LAB — URINE CULTURE: Culture: NO GROWTH

## 2018-09-05 MED ORDER — INSULIN ASPART 100 UNIT/ML ~~LOC~~ SOLN: 0.0000 [IU] | Freq: Every day | SUBCUTANEOUS | Status: DC

## 2018-09-05 MED ORDER — INSULIN ASPART 100 UNIT/ML ~~LOC~~ SOLN
0.0000 [IU] | Freq: Every day | SUBCUTANEOUS | Status: DC
  Administered 2018-09-05: 3 [IU] via SUBCUTANEOUS
  Administered 2018-09-06: 2 [IU] via SUBCUTANEOUS

## 2018-09-05 MED ORDER — POTASSIUM CL IN DEXTROSE 5% 20 MEQ/L IV SOLN
20.0000 meq | INTRAVENOUS | Status: AC
  Administered 2018-09-05: 20 meq via INTRAVENOUS
  Filled 2018-09-05: qty 1000

## 2018-09-05 MED ORDER — HYDROCHLOROTHIAZIDE 25 MG PO TABS
25.0000 mg | ORAL_TABLET | Freq: Every day | ORAL | Status: DC
  Administered 2018-09-05 – 2018-09-06 (×2): 25 mg via ORAL
  Filled 2018-09-05 (×2): qty 1

## 2018-09-05 MED ORDER — METOCLOPRAMIDE HCL 5 MG PO TABS
2.5000 mg | ORAL_TABLET | Freq: Three times a day (TID) | ORAL | Status: DC
  Filled 2018-09-05 (×6): qty 0.5

## 2018-09-05 MED ORDER — INSULIN GLARGINE 100 UNIT/ML ~~LOC~~ SOLN
12.0000 [IU] | SUBCUTANEOUS | Status: DC
  Administered 2018-09-05 (×2): 12 [IU] via SUBCUTANEOUS
  Filled 2018-09-05 (×3): qty 0.12

## 2018-09-05 MED ORDER — ATORVASTATIN CALCIUM 20 MG PO TABS
20.0000 mg | ORAL_TABLET | Freq: Every day | ORAL | Status: DC
  Administered 2018-09-05 – 2018-09-07 (×3): 20 mg via ORAL
  Filled 2018-09-05 (×3): qty 1

## 2018-09-05 MED ORDER — SODIUM CHLORIDE 0.45 % IV SOLN
INTRAVENOUS | Status: DC
  Administered 2018-09-05: 06:00:00 via INTRAVENOUS

## 2018-09-05 MED ORDER — INSULIN ASPART 100 UNIT/ML ~~LOC~~ SOLN: 0.0000 [IU] | Freq: Three times a day (TID) | SUBCUTANEOUS | Status: DC

## 2018-09-05 MED ORDER — METOCLOPRAMIDE HCL 5 MG/5ML PO SOLN
2.5000 mg | Freq: Three times a day (TID) | ORAL | Status: DC
  Administered 2018-09-05 – 2018-09-07 (×8): 2.5 mg via ORAL
  Filled 2018-09-05: qty 10
  Filled 2018-09-05: qty 5
  Filled 2018-09-05 (×2): qty 10
  Filled 2018-09-05: qty 5
  Filled 2018-09-05: qty 10
  Filled 2018-09-05 (×2): qty 5
  Filled 2018-09-05 (×4): qty 10
  Filled 2018-09-05: qty 5

## 2018-09-05 MED ORDER — INSULIN ASPART 100 UNIT/ML ~~LOC~~ SOLN
3.0000 [IU] | Freq: Three times a day (TID) | SUBCUTANEOUS | Status: DC
  Administered 2018-09-05 – 2018-09-07 (×8): 3 [IU] via SUBCUTANEOUS

## 2018-09-05 MED ORDER — LIVING WELL WITH DIABETES BOOK
Freq: Once | Status: AC
  Administered 2018-09-05: 14:00:00

## 2018-09-05 MED ORDER — INSULIN ASPART 100 UNIT/ML ~~LOC~~ SOLN
0.0000 [IU] | Freq: Three times a day (TID) | SUBCUTANEOUS | Status: DC
  Administered 2018-09-05 (×2): 8 [IU] via SUBCUTANEOUS
  Administered 2018-09-05: 5 [IU] via SUBCUTANEOUS
  Administered 2018-09-06: 3 [IU] via SUBCUTANEOUS
  Administered 2018-09-06 (×2): 15 [IU] via SUBCUTANEOUS
  Administered 2018-09-07: 13:00:00 14 [IU] via SUBCUTANEOUS
  Administered 2018-09-07: 8 [IU] via SUBCUTANEOUS

## 2018-09-05 NOTE — Discharge Instructions (Addendum)
Glucose Products:  ReliOnT glucose products raise low blood sugar fast. Tablets are free of fat, caffeine, sodium and gluten. They are portable and easy to carry, making it easier for people with diabetes to BE PREPARED for lows.  Glucose Tablets Available in 6 flavors . 10 ct...................................... $1.00 . 50 ct...................................... $3.98 Glucose Shot..................................$1.48 Glucose Gel....................................$3.44  Alcohol Swabs Alcohol swabs are used to sterilize your injection site. All of our swabs are individually wrapped for maximum safety, convenience and moisture retention. ReliOnT Alcohol Swabs . 100 ct Swabs..............................$1.00 . 400 ct Swabs..............................$3.74  Lancets ReliOnT offers three lancet options conveniently designed to work with almost every lancing device. Each features a protective disk, which guarantees sterility before testing. ReliOnT Lancets . 100 ct Lancets $1.56 . 200 ct Lancets $2.64 Available in Ultra-Thin, Thin & Micro-Thin ReliOnT 2-IN-1 Lancing Device . 50 ct Lancets..................................... $3.44 Available in 30 gauge and 25 gauge ReliOnT Lancing Device....................$5.84  Blood Glucose Monitors ReliOnT offers a full range of blood glucose testing options to provide an accurate, affordable system that meets each person's unique needs and preferences. Prime Meter....................................... $9.00 Prime Test Strips . 25 test strips.................................... $5.00 . 50 test strips.................................... $9.00 . 100 test strips.................................$17.88 Premier BLU Meter  ............  $18.98 Premier Voice Meter  .............  $14.98 Premier Test Strips . 50 test strips.................................... $9.00 . 100 test strips.................................$17.88 Premier [employer]   ............  $19.44 Kit includes: . 50 test strips . 10 lancets . Lancing device . Carry case    Human Insulin  Novolin/ReliOnT (recombinant DNA origin) is manufactured for [employer] by [employer] Insulin* with Vial..........$24.88 Available in N, R, 70/30 Novolin/ReliOnT Insulin Pens*  .....  $42.88 Available in 70/30, N, R  Insulin Delivery ReliOnT syringes and pen needles provide precision technology, comfort and accuracy in insulin delivery at affordable prices. ReliOnT Pen Needles* . 50 ct....................................................$9.00 Available in 4mm, 6mm, 8mm & 12mm ReliOnT Insulin Syringes* . 100 ct ............ $12.58 Available in 29G, 30G & 31G (3/10cc, 1/2cc & 1cc units)   [facility] offers FREE diabetes classes. Classes will be held in Dining Room D: ? Every first Monday of the month  @ 9:00 AM-11:00 AM ? Every third Monday of the month @ 5:30 PM- 7:30 PM  Each class will cover the following topics: ?  Diabetes Management Basics (Survival skills) ? Meal planning  Classes are 2 hours long.  Registration is required. Please call [phone]  to register.  No referral or fee is necessary to attend. Classes are open to the public.  For more information, call Norm Salt, RD, CDE at [phone].

## 2018-09-05 NOTE — Plan of Care (Signed)
  Problem: Acute Rehab PT Goals(only PT should resolve) Goal: Pt Will Go Supine/Side To Sit Outcome: Progressing Flowsheets (Taken 09/05/2018 1245) Pt will go Supine/Side to Sit: Independently Goal: Patient Will Transfer Sit To/From Stand Outcome: Progressing Flowsheets (Taken 09/05/2018 1245) Patient will transfer sit to/from stand: with supervision Goal: Pt Will Transfer Bed To Chair/Chair To Bed Outcome: Progressing Flowsheets (Taken 09/05/2018 1245) Pt will Transfer Bed to Chair/Chair to Bed: with supervision Goal: Pt Will Ambulate Outcome: Progressing Flowsheets (Taken 09/05/2018 1245) Pt will Ambulate: > 125 feet; with supervision; with cane; with rolling walker   12:46 PM, 09/05/18 Lonell Grandchild, MPT Physical Therapist with [facility] [phone] office [phone] mobile phone

## 2018-09-05 NOTE — Progress Notes (Signed)
Critical lactic acid of 2.2 and critical sodium of 161. Dr.Opyd notified via text page. Orders received and completed.

## 2018-09-05 NOTE — Progress Notes (Signed)
PROGRESS NOTE    [name]  LFY:[id number] DOB: 1965/04/27 DOA: 09/04/2018 PCP: Royce Macadamia D., PA-C   Brief Narrative:  Per HPI: [name] [name] is a 53 y.o. female with medical history significant for hypertension, Graves' disease/hypothyroidism, IBS, gastroparesis, dyslipidemia, and questionable recent diagnosis of diabetes who was brought to the ED via EMS with altered mental status and progressive lethargy over the last 24 hours.  Patient was recently started on Lantus, but has not eaten in the last 24 hours and blood glucose was measured to be quite elevated.  Patient is somewhat somnolent and difficult to obtain history from, but was complaining of some dehydration earlier in the ED visit.  She denies any abdominal pain, nausea, vomiting, fevers, or chills.  Patient was admitted with acute encephalopathy secondary to DKA in the setting of newly diagnosed diabetes.  She is overall doing much better on this morning of 5/12 with DKA that has resolved.  She will be started on a diet and has been transitioned to SSI.  She continues to have significant hypernatremia and AKI that is resolving.  Assessment & Plan:   Principal Problem:   DKA (diabetic ketoacidoses) ([place]) Active Problems:   Gastroparesis   Hypothyroidism   AKI (acute kidney injury) ([place]) 1. Acute encephalopathy secondary to DKA in the setting of newly diagnosed diabetes-resolved.  Patient now transition to [place] which has been increased to moderate scale on account of blood glucose 240 this a.m.  Will maintain on long-acting insulin and appreciate diabetes coordinator recommendations.  Hemoglobin A1c currently pending.  Plan to start carb modified diet today and transfer to [facility] floor. 2. Hypernatremia.  Secondary to excessive normal saline administration for treatment of above.  Will place on D5 water, time-limited for now as well as restart home HCTZ which should help with the process.  Repeat BMP and monitor for  improvement. 3. Mild lactic acidosis.  Likely related to above processes.  Will recheck in a.m. 4. AKI-resolving.   Patient did appear to have some urinary retention initially, but this appears to be more of a dehydration issue related to DKA.  She is noted to have good urine output and pure wick.  Continue to monitor on repeat labs.  Plan to restart HCTZ today with IV fluid to help with hypernatremia. 5. Hypothyroidism.  Maintain on Synthroid. 6. Gastroparesis.    Restart home Reglan with diet today. 7. Hypertension.  Maintain on home blood pressure medications and monitor.  Restarted HCTZ today. 8. Dyslipidemia.  Restart home statin.   DVT prophylaxis: Heparin Code Status: Full Family Communication: We will update husband Disposition Plan: Transfer to MedSurg floor and maintain on D5 water and monitor electrolytes.  Anticipate discharge in a.m. once labs are improved and tolerating diet.  Appreciate diabetes coordinator recommendations.   Consultants:   None  Procedures:   None  Antimicrobials:   None   Subjective: Patient seen and evaluated today with no new acute complaints or concerns. No acute concerns or events noted overnight.  She is much more awake and alert and denies any abdominal pain, nausea, or vomiting.  She would like to try to have some breakfast this morning.  Objective: Vitals:   09/05/18 0500 09/05/18 0600 09/05/18 0740 09/05/18 0744  BP: (!) 155/64 (!) 143/60    Pulse: (!) 103 92    Resp: (!) 22 19    Temp:    98.1 F (36.7 C)  TempSrc:    Oral  SpO2: 95% 97% 97%  Weight: 79.2 kg     Height:        Intake/Output Summary (Last 24 hours) at 09/05/2018 0751 Last data filed at 09/05/2018 0657 Gross per 24 hour  Intake 4781.98 ml  Output 1400 ml  Net 3381.98 ml   Filed Weights   09/04/18 1003 09/04/18 1545 09/05/18 0500  Weight: 74.8 kg 78.8 kg 79.2 kg    Examination:  General exam: Appears calm and comfortable  Respiratory system: Clear to  auscultation. Respiratory effort normal. Cardiovascular system: S1 & S2 heard, RRR. No JVD, murmurs, rubs, gallops or clicks. No pedal edema. Gastrointestinal system: Abdomen is nondistended, soft and nontender. No organomegaly or masses felt. Normal bowel sounds heard. Central nervous system: Alert and oriented. No focal neurological deficits. Extremities: Symmetric 5 x 5 power. Skin: No rashes, lesions or ulcers Psychiatry: Judgement and insight appear normal. Mood & affect appropriate.     Data Reviewed: I have personally reviewed following labs and imaging studies  CBC: Recent Labs  Lab 09/04/18 1007 09/05/18 0458  WBC 11.9* 17.9*  NEUTROABS 10.4*  --   HGB 14.4 12.8  HCT 52.3* 43.0  MCV 96.9 89.4  PLT 297 094   Basic Metabolic Panel: Recent Labs  Lab 09/04/18 1007 09/04/18 1146 09/04/18 1411 09/04/18 1747 09/04/18 2145 09/05/18 0458  NA 139  --  149* 156* 159* 161*  K 4.9  --  3.9 3.5 3.1* 3.6  CL 82*  --  107 118* 120* 121*  CO2 18*  --  16* 21* 26 26  GLUCOSE 1,386*  --  845* 420* 209* 193*  BUN 90*  --  77* 66* 58* 49*  CREATININE 4.65*  --  3.62* 2.82* 2.46* 2.18*  CALCIUM 10.8*  --  8.9 9.0 9.2 8.9  MG  --  3.9*  --   --   --   --    GFR: Estimated Creatinine Clearance: 31 mL/min (A) (by C-G formula based on SCr of 2.18 mg/dL (H)). Liver Function Tests: Recent Labs  Lab 09/04/18 1125  AST 16  ALT 21  ALKPHOS 107  BILITOT 1.9*  PROT 8.3*  ALBUMIN 3.6   No results for input(s): LIPASE, AMYLASE in the last 168 hours. No results for input(s): AMMONIA in the last 168 hours. Coagulation Profile: No results for input(s): INR, PROTIME in the last 168 hours. Cardiac Enzymes: No results for input(s): CKTOTAL, CKMB, CKMBINDEX, TROPONINI in the last 168 hours. BNP (last 3 results) No results for input(s): PROBNP in the last 8760 hours. HbA1C: No results for input(s): HGBA1C in the last 72 hours. CBG: Recent Labs  Lab 09/04/18 2239 09/04/18 2337  09/05/18 0029 09/05/18 0136 09/05/18 0742  GLUCAP 125* 99 112* 118* 242*   Lipid Profile: No results for input(s): CHOL, HDL, LDLCALC, TRIG, CHOLHDL, LDLDIRECT in the last 72 hours. Thyroid Function Tests: No results for input(s): TSH, T4TOTAL, FREET4, T3FREE, THYROIDAB in the last 72 hours. Anemia Panel: No results for input(s): VITAMINB12, FOLATE, FERRITIN, TIBC, IRON, RETICCTPCT in the last 72 hours. Sepsis Labs: Recent Labs  Lab 09/05/18 0458  LATICACIDVEN 2.2*    Recent Results (from the past 240 hour(s))  SARS Coronavirus 2 (CEPHEID- Performed in [facility] hospital lab), Hosp Order     Status: None   Collection Time: 09/04/18 12:33 PM  Result Value Ref Range Status   SARS Coronavirus 2 NEGATIVE NEGATIVE Final    Comment: (NOTE) If result is NEGATIVE SARS-CoV-2 target nucleic acids are NOT DETECTED. The SARS-CoV-2 RNA  is generally detectable in upper and lower  respiratory specimens during the acute phase of infection. The lowest  concentration of SARS-CoV-2 viral copies this assay can detect is 250  copies / mL. A negative result does not preclude SARS-CoV-2 infection  and should not be used as the sole basis for treatment or other  patient management decisions.  A negative result may occur with  improper specimen collection / handling, submission of specimen other  than nasopharyngeal swab, presence of viral mutation(s) within the  areas targeted by this assay, and inadequate number of viral copies  (<250 copies / mL). A negative result must be combined with clinical  observations, patient history, and epidemiological information. If result is POSITIVE SARS-CoV-2 target nucleic acids are DETECTED. The SARS-CoV-2 RNA is generally detectable in upper and lower  respiratory specimens dur ing the acute phase of infection.  Positive  results are indicative of active infection with SARS-CoV-2.  Clinical  correlation with patient history and other diagnostic information  is  necessary to determine patient infection status.  Positive results do  not rule out bacterial infection or co-infection with other viruses. If result is PRESUMPTIVE POSTIVE SARS-CoV-2 nucleic acids MAY BE PRESENT.   A presumptive positive result was obtained on the submitted specimen  and confirmed on repeat testing.  While 2019 novel coronavirus  (SARS-CoV-2) nucleic acids may be present in the submitted sample  additional confirmatory testing may be necessary for epidemiological  and / or clinical management purposes  to differentiate between  SARS-CoV-2 and other Sarbecovirus currently known to infect humans.  If clinically indicated additional testing with an alternate test  methodology [phone]) is advised. The SARS-CoV-2 RNA is generally  detectable in upper and lower respiratory sp ecimens during the acute  phase of infection. The expected result is Negative. Fact Sheet for Patients:  StrictlyIdeas.no Fact Sheet for Healthcare Providers: BankingDealers.co.za This test is not yet approved or cleared by the [place] FDA and has been authorized for detection and/or diagnosis of SARS-CoV-2 by FDA under an Emergency Use Authorization (EUA).  This EUA will remain in effect (meaning this test can be used) for the duration of the COVID-19 declaration under Section 564(b)(1) of the Act, 21 U.S.C. section 360bbb-3(b)(1), unless the authorization is terminated or revoked sooner. Performed at [facility], [address] [place], [place] [address]   MRSA PCR Screening     Status: None   Collection Time: 09/04/18  2:57 PM  Result Value Ref Range Status   MRSA by PCR NEGATIVE NEGATIVE Final    Comment:        The GeneXpert MRSA Assay (FDA approved for NASAL specimens only), is one component of a comprehensive MRSA colonization surveillance program. It is not intended to diagnose MRSA infection nor to guide or monitor treatment  for MRSA infections. Performed at [facility], [address] [place], [place] [address]          Radiology Studies: Dg Foot Complete Right  Result Date: 09/03/2018 CLINICAL DATA:  Acute RIGHT foot pain. No known injury. Initial encounter. EXAM: RIGHT FOOT COMPLETE - 3+ VIEW COMPARISON:  None. FINDINGS: No acute fracture, subluxation or dislocation. No focal bony lesions are identified. The Lisfranc joints and calcaneus are unremarkable. IMPRESSION: Negative. Electronically Signed   By: Margarette Canada M.D.   On: 09/03/2018 12:50   Dg Hip Unilat With Pelvis 2-3 Views Right  Result Date: 09/03/2018 CLINICAL DATA:  RIGHT hip pain.  Difficulty walking. EXAM: DG HIP (WITH  OR WITHOUT PELVIS) 2-3V RIGHT COMPARISON:  None. FINDINGS: Hips are located. No pelvic fracture or sacral fracture. Dedicated views of the RIGHT hip demonstrate no fracture. Mild sclerosis of the acetabular roofs. IMPRESSION: Mild osteoarthritis of the hips.  No acute findings. Electronically Signed   By: Suzy Bouchard M.D.   On: 09/03/2018 10:26        Scheduled Meds:  atorvastatin  20 mg Oral Daily   heparin  5,000 Units Subcutaneous Q8H   hydrochlorothiazide  25 mg Oral Daily   insulin aspart  0-15 Units Subcutaneous TID WC   insulin aspart  0-5 Units Subcutaneous QHS   insulin aspart  3 Units Subcutaneous TID WC   insulin glargine  12 Units Subcutaneous Q24H   levothyroxine  88 mcg Oral QAC breakfast   metoCLOPramide  2.5 mg Oral TID AC & HS   metoprolol tartrate  25 mg Oral Daily   pantoprazole  40 mg Oral Daily   Continuous Infusions:  dextrose 5 % with KCl 20 mEq / L       LOS: 1 day    Time spent: 30 minutes    Eldred Sooy Darleen Crocker, DO [facility] Pager [phone]  If 7PM-7AM, please contact night-coverage [email or web address] Password [facility] 09/05/2018, 7:51 AM

## 2018-09-05 NOTE — Progress Notes (Signed)
Has periods of confusion such as going to bathroom and not taking down underwear.  Teaching from diabetic book attempted and patient voiced understanding but needs to be reinforced.  Lab tech unable to obtain blood and Dr. Manuella Ghazi notified. Sister , Engineer, maintenance , visited patient

## 2018-09-05 NOTE — Progress Notes (Addendum)
Inpatient Diabetes Program Recommendations  AACE/ADA: New Consensus Statement on Inpatient Glycemic Control (2015)  Target Ranges:  Prepandial:   less than 140 mg/dL      Peak postprandial:   less than 180 mg/dL (1-2 hours)      Critically ill patients:  140 - 180 mg/dL   Results for [name] [name], [name] (MRN [id number]) as of 09/05/2018 12:07  Ref. Range 09/05/2018 01:36 09/05/2018 07:42 09/05/2018 11:23  Glucose-Capillary Latest Ref Range: 70 - 99 mg/dL 118 (H) 242 (H) 292 (H)    Review of Glycemic Control  Diabetes history: New DM 2 diagnosis  Current orders for Inpatient glycemic control:  Lantus 12 units Novolog 0-15 units tid + 0-5 units qhs Novolog 3 units tid meal coverage  A1c in process  Inpatient Diabetes Program Recommendations:    Based on glucose trends consider increasing Lantus to 18-20 units.  Spoke with patient over the phone in regards to new Diabetes diagnosis. Patient reports going to [facility] on Friday. She said the doctor told her she had sugar and that was it. She said she did not pick up her insulin from the pharmacy at that time but was prescribed insulin, patient is not sure at what dose. Discussed what an A1C is and that we are waiting for hers to come back.   informed patient that her glucose on admission was over 1,300 mg/dl.  Discussed basic pathophysiology of DM Type 2, basic home care, importance of checking CBGs and maintaining good CBG control to prevent long-term and short-term complications. Discussed checking glucose 2 times a day (fasting and alternating second check).  Reviewed signs and symptoms of hypoglycemia along with treatment. Discussed impact of nutrition, exercise, stress, sickness, and medications on diabetes control. Patient reports drinking gingerale and water, discussed choosing diet, zero, light beverage options. Patient also mentions she occassionally skips meals. Discussed benefits of small meals over the  course of the day on metabolism and glucose control.  Told patient to review Living Well with diabetes booklet and encouraged patient to read through entire book. Informed patient that she maybe prescribed insulin at discharged with possible oral med as well and to take with food.  Patient reported she had insurance and medication coverage.   Patient will need to review DM videos and will need to perform insulin administration and CBG check before d/c.  Patient was very tired during conversation. Had to stop education early. Will try to touch base with patient either this afternoon or tomorrow morning.  RNs to provide ongoing basic DM education at bedside with this patient and engage patient to actively check blood glucose and administer insulin injections.   Thanks, Tama Headings RN, MSN, BC-ADM Inpatient Diabetes Coordinator Team Pager [phone] (8a-5p)

## 2018-09-05 NOTE — Evaluation (Signed)
Physical Therapy Evaluation Patient Details Name: [name] [name] MRN: [id number] DOB: Sep 10, 1965 Today's Date: 09/05/2018   History of Present Illness  [name] [name] is a 53 y.o. female with medical history significant for hypertension, Graves' disease/hypothyroidism, IBS, gastroparesis, dyslipidemia, and questionable recent diagnosis of diabetes who was brought to the ED via EMS with altered mental status and progressive lethargy over the last 24 hours.  Patient was recently started on Lantus, but has not eaten in the last 24 hours and blood glucose was measured to be quite elevated.  Patient is somewhat somnolent and difficult to obtain history from, but was complaining of some dehydration earlier in the ED visit.  She denies any abdominal pain, nausea, vomiting, fevers, or chills.    Clinical Impression  Patient has to lean on nearby objects for support and requires hand held assist to avoid falling due to generalized weakness, demonstrates good return for using RW during ambulation in room and hallways without loss of balance and tolerated sitting up in chair after therapy.  Patient will benefit from continued physical therapy in hospital and recommended venue below to increase strength, balance, endurance for safe ADLs and gait.    Follow Up Recommendations Outpatient PT    Equipment Recommendations  Rolling walker with 5" wheels    Recommendations for Other Services       Precautions / Restrictions Precautions Precautions: Fall Restrictions Weight Bearing Restrictions: No      Mobility  Bed Mobility Overal bed mobility: Modified Independent             General bed mobility comments: increased time  Transfers Overall transfer level: Needs assistance Equipment used: None;Rolling walker (2 wheeled) Transfers: Sit to/from [facility] Sit to Stand: Min assist Stand pivot transfers: Min assist       General transfer comment: very unsteady having to  lean on objects requring hand held assist when not using AD, Min guard using RW  Ambulation/Gait Ambulation/Gait assistance: Supervision;Min guard Gait Distance (Feet): 100 Feet Assistive device: Rolling walker (2 wheeled) Gait Pattern/deviations: Decreased step length - right;Decreased step length - left;Decreased stride length Gait velocity: decreased   General Gait Details: very unsteady on feet when attempting ambulation without AD, required RW for safety and balance, demonstrates good return for using RW without loss of balance  Stairs            Wheelchair Mobility    Modified Rankin (Stroke Patients Only)       Balance Overall balance assessment: Needs assistance Sitting-balance support: Feet supported;No upper extremity supported Sitting balance-Leahy Scale: Good     Standing balance support: No upper extremity supported;During functional activity Standing balance-Leahy Scale: Poor Standing balance comment: fair using RW                             Pertinent Vitals/Pain Pain Assessment: 0-10 Pain Score: 9  Pain Location: stomach Pain Descriptors / Indicators: Aching;Discomfort Pain Intervention(s): Limited activity within patient's tolerance;Monitored during session    Home Living Family/patient expects to be discharged to:: Private residence Living Arrangements: Spouse/significant other Available Help at Discharge: Family Type of Home: House Home Access: Stairs to enter Entrance Stairs-Rails: None Entrance Stairs-Number of Steps: 2 Home Layout: One level Home Equipment: None      Prior Function Level of Independence: Independent         Comments: Hydrographic surveyor, drives     Journalist, newspaper  Extremity/Trunk Assessment   Upper Extremity Assessment Upper Extremity Assessment: Generalized weakness    Lower Extremity Assessment Lower Extremity Assessment: Generalized weakness    Cervical / Trunk  Assessment Cervical / Trunk Assessment: Normal  Communication   Communication: No difficulties  Cognition Arousal/Alertness: Awake/alert Behavior During Therapy: WFL for tasks assessed/performed Overall Cognitive Status: Within Functional Limits for tasks assessed                                        General Comments      Exercises     Assessment/Plan    PT Assessment Patient needs continued PT services  PT Problem List Decreased strength;Decreased activity tolerance;Decreased balance;Decreased mobility       PT Treatment Interventions Therapeutic exercise;Gait training;Stair training;Functional mobility training;Therapeutic activities;Patient/family education    PT Goals (Current goals can be found in the Care Plan section)  Acute Rehab PT Goals Patient Stated Goal: return home PT Goal Formulation: With patient Time For Goal Achievement: 09/08/18 Potential to Achieve Goals: Good    Frequency Min 3X/week   Barriers to discharge        Co-evaluation               AM-PAC PT "6 Clicks" Mobility  Outcome Measure Help needed turning from your back to your side while in a flat bed without using bedrails?: None Help needed moving from lying on your back to sitting on the side of a flat bed without using bedrails?: None Help needed moving to and from a bed to a chair (including a wheelchair)?: A Little Help needed standing up from a chair using your arms (e.g., wheelchair or bedside chair)?: A Little Help needed to walk in hospital room?: A Little Help needed climbing 3-5 steps with a railing? : A Lot 6 Click Score: 19    End of Session Equipment Utilized During Treatment: Gait belt Activity Tolerance: Patient tolerated treatment well;Patient limited by fatigue Patient left: in chair;with call bell/phone within reach Nurse Communication: Mobility status PT Visit Diagnosis: Unsteadiness on feet (R26.81);Other abnormalities of gait and mobility  (R26.89);Muscle weakness (generalized) (M62.81)    Time: 8416-6063 PT Time Calculation (min) (ACUTE ONLY): 34 min   Charges:   PT Evaluation $PT Eval Moderate Complexity: 1 Mod PT Treatments $Therapeutic Activity: 23-37 mins        12:44 PM, 09/05/18 Lonell Grandchild, MPT Physical Therapist with [facility] [phone] office [phone] mobile phone

## 2018-09-05 NOTE — TOC Transition Note (Addendum)
Transition of Care [facility]) - CM/SW Discharge Note   Patient Details  Name: [name] [name] MRN: [id number] Date of Birth: 09-03-1965  Transition of Care [facility]) CM/SW Contact:  Andre Swander, Chauncey [place], RN Phone Number: 09/05/2018, 2:20 PM   Clinical Narrative:   New Diabetic. From home, works FT. Goes to [facility] for primary care. Reports she has insurance that pays for medications, gets prescriptions filled at [facility]. Discussed Reli on brand and cheapest insulin at [facility].  Recommended for outpatient PT. Agreeable. Would like referral to [facility] OP rehab.  Agreeable to RW, elects [facility]     Final next level of care: OP Rehab Barriers to Discharge: No Barriers Identified   Discharge Plan and Services   Discharge Planning Services: CM Consult            DME Arranged: Walker rolling DME Agency: [facility] Date DME Agency Contacted: 09/05/18 Time DME Agency Contacted: [phone]      Readmission Risk Interventions No flowsheet data found.     Expected Discharge Plan: OP Rehab Barriers to Discharge: No Barriers Identified    Expected Discharge Plan and Services Expected Discharge Plan: OP Rehab   Discharge Planning Services: CM Consult                     DME Arranged: Walker rolling DME Agency: [facility] Date DME Agency Contacted: 09/05/18 Time DME Agency Contacted: [phone]                 Activities of Daily Living Home Assistive Devices/Equipment: None ADL Screening (condition at time of admission) Patient's cognitive ability adequate to safely complete daily activities?: Yes Is the patient deaf or have difficulty hearing?: No Does the patient have difficulty seeing, even when wearing glasses/contacts?: No Does the patient have difficulty concentrating, remembering, or making decisions?: No Patient able to express need for assistance with ADLs?: Yes Does the patient have difficulty dressing or bathing?: No Independently performs ADLs?: Yes (appropriate for  developmental age) Does the patient have difficulty walking or climbing stairs?: No Weakness of Legs: None Weakness of Arms/Hands: None   Emotional Assessment     Affect (typically observed): Accepting Orientation: : Oriented to Self, Oriented to Place, Oriented to  Time      Admission diagnosis:  Acute renal failure, unspecified acute renal failure type ([place]) [N17.9] Diabetic ketoacidosis without coma associated with type 1 diabetes mellitus ([place]) [E10.10] Patient Active Problem List   Diagnosis Date Noted  . DKA (diabetic ketoacidoses) ([place]) 09/04/2018  . AKI (acute kidney injury) ([place]) 09/04/2018  . Cystocele, midline 10/10/2017  . Vaginal dryness 10/10/2017  . Screening for colorectal cancer 10/10/2017  . Encounter for well woman exam with routine gynecological exam 10/10/2017  . Abnormal CT scan, neck 06/14/2017  . Odynophagia 01/10/2017  . Neck pain on right side 01/10/2017  . Abdominal pain, epigastric 02/09/2016  . Colon cancer screening   . Mucosal abnormality of stomach   . Dysphagia, pharyngoesophageal phase   . Esophageal dysphagia 10/15/2014  . Chronic fatigue 04/10/2014  . Encounter for screening colonoscopy 09/05/2013  . Chest wall pain 09/11/2012  . Hypothyroidism 11/18/2011  . Obesity, unspecified 11/18/2011  . GERD 12/05/2008  . Gastroparesis 04/03/2008  . CONSTIPATION, CHRONIC 04/03/2008   PCP:  Raiford Simmonds., PA-C Pharmacy:   [place], [place] - [address] [id number] [place] [address] Phone: [phone] Fax: [phone]   Readmission Risk Interventions No flowsheet data found.

## 2018-09-06 DIAGNOSIS — R5381 Other malaise: Secondary | ICD-10-CM

## 2018-09-06 DIAGNOSIS — E032 Hypothyroidism due to medicaments and other exogenous substances: Secondary | ICD-10-CM

## 2018-09-06 DIAGNOSIS — K3184 Gastroparesis: Secondary | ICD-10-CM

## 2018-09-06 DIAGNOSIS — N179 Acute kidney failure, unspecified: Secondary | ICD-10-CM

## 2018-09-06 LAB — CBC
HCT: 44.1 % (ref 36.0–46.0)
Hemoglobin: 12.7 g/dL (ref 12.0–15.0)
MCH: 26.5 pg (ref 26.0–34.0)
MCHC: 28.8 g/dL — ABNORMAL LOW (ref 30.0–36.0)
MCV: 91.9 fL (ref 80.0–100.0)
Platelets: 164 10*3/uL (ref 150–400)
RBC: 4.8 MIL/uL (ref 3.87–5.11)
RDW: 13.2 % (ref 11.5–15.5)
WBC: 9.2 10*3/uL (ref 4.0–10.5)
nRBC: 0.2 % (ref 0.0–0.2)

## 2018-09-06 LAB — BASIC METABOLIC PANEL
Anion gap: 15 (ref 5–15)
BUN: 27 mg/dL — ABNORMAL HIGH (ref 6–20)
CO2: 24 mmol/L (ref 22–32)
Calcium: 8.4 mg/dL — ABNORMAL LOW (ref 8.9–10.3)
Chloride: 111 mmol/L (ref 98–111)
Creatinine, Ser: 1.6 mg/dL — ABNORMAL HIGH (ref 0.44–1.00)
GFR calc Af Amer: 42 mL/min — ABNORMAL LOW (ref >60)
GFR calc non Af Amer: 36 mL/min — ABNORMAL LOW (ref >60)
Glucose, Bld: 326 mg/dL — ABNORMAL HIGH (ref 70–99)
Potassium: 5 mmol/L (ref 3.5–5.1)
Sodium: 150 mmol/L — ABNORMAL HIGH (ref 135–145)

## 2018-09-06 LAB — MAGNESIUM: Magnesium: 2.5 mg/dL — ABNORMAL HIGH (ref 1.7–2.4)

## 2018-09-06 LAB — GLUCOSE, CAPILLARY
Glucose-Capillary: 269 mg/dL — ABNORMAL HIGH (ref 70–99)
Glucose-Capillary: 361 mg/dL — ABNORMAL HIGH (ref 70–99)
Glucose-Capillary: 373 mg/dL — ABNORMAL HIGH (ref 70–99)
Glucose-Capillary: 185 mg/dL — ABNORMAL HIGH (ref 70–99)
Glucose-Capillary: 243 mg/dL — ABNORMAL HIGH (ref 70–99)

## 2018-09-06 LAB — LACTIC ACID, PLASMA: Lactic Acid, Venous: 2.8 mmol/L (ref 0.5–1.9)

## 2018-09-06 MED ORDER — SODIUM CHLORIDE 0.9 % IV SOLN: INTRAVENOUS | Status: DC

## 2018-09-06 MED ORDER — SODIUM CHLORIDE 0.45 % IV SOLN
INTRAVENOUS | Status: DC
  Administered 2018-09-06 – 2018-09-07 (×3): via INTRAVENOUS

## 2018-09-06 MED ORDER — INSULIN GLARGINE 100 UNIT/ML ~~LOC~~ SOLN
20.0000 [IU] | SUBCUTANEOUS | Status: DC
  Administered 2018-09-06: 20 [IU] via SUBCUTANEOUS
  Filled 2018-09-06 (×2): qty 0.2

## 2018-09-06 MED ORDER — SODIUM CHLORIDE 0.45 % IV BOLUS
250.0000 mL | Freq: Once | INTRAVENOUS | Status: AC
  Administered 2018-09-06: 250 mL via INTRAVENOUS

## 2018-09-06 NOTE — Progress Notes (Signed)
Insulin pen teaching attempted with repeat demonstration.  Needs reinforcement.

## 2018-09-06 NOTE — Progress Notes (Signed)
Talked with husband, Legrand Como , concerning diabetes education, the need to check blood glucose, insulin, diet and staying active and signs of hypo and hyperglycemia.

## 2018-09-06 NOTE — Progress Notes (Addendum)
Inpatient Diabetes Program Recommendations  AACE/ADA: New Consensus Statement on Inpatient Glycemic Control (2015)  Target Ranges:  Prepandial:   less than 140 mg/dL      Peak postprandial:   less than 180 mg/dL (1-2 hours)      Critically ill patients:  140 - 180 mg/dL   Results for AVEEN, STANSEL (MRN [id number]) as of 09/05/2018 12:07  Ref. Range 09/05/2018 01:36 09/05/2018 07:42 09/05/2018 11:23  Glucose-Capillary Latest Ref Range: 70 - 99 mg/dL 118 (H) 242 (H) 292 (H)    Review of Glycemic Control  Diabetes history: New DM 2 diagnosis  Current orders for Inpatient glycemic control:  Lantus 12 units Novolog 0-15 units tid + 0-5 units qhs Novolog 3 units tid meal coverage  A1c >15.5%  Inpatient Diabetes Program Recommendations:    Based on glucose trends consider increasing Lantus to 20-24 units.  Spoke with patient over the phone in regards to new Diabetes diagnosis. Answered questions about diet and portion sizes, exercise. Discussed basic patho again and A1c and glucose goals. Discussed checking glucose twice a day and where to inject insulin. Patient has insurance so will most likely benefit from insulin pen and pen needles. At d/c. Spoke with patient about possibly being on basal insulin in addition to an oral agent at time of d/c.  Told patient to review Living Well with diabetes booklet and encouraged patient to read through entire book.   Discussed hypoglycemia and hyperglycemia gain s/s and treatment for both.  Patient will need to review DM videos. Patient reports going to change PCP and to see Lucas Mallow on [address].  Per RN patient has self injected and performed CBG at bedside and tolerated well.  RNs to provide ongoing basic DM education at bedside with this patient and engage patient to actively check blood glucose and administer insulin injections.   Thanks, Tama Headings RN, MSN, BC-ADM Inpatient Diabetes Coordinator Team Pager [phone] (8a-5p)

## 2018-09-06 NOTE — Progress Notes (Signed)
Physical Therapy Treatment Patient Details Name: [name] [name] MRN: [id number] DOB: 27-Apr-1965 Today's Date: 09/06/2018    History of Present Illness [name] [name] is a 53 y.o. female with medical history significant for hypertension, Graves' disease/hypothyroidism, IBS, gastroparesis, dyslipidemia, and questionable recent diagnosis of diabetes who was brought to the ED via EMS with altered mental status and progressive lethargy over the last 24 hours.  Patient was recently started on Lantus, but has not eaten in the last 24 hours and blood glucose was measured to be quite elevated.  Patient is somewhat somnolent and difficult to obtain history from, but was complaining of some dehydration earlier in the ED visit.  She denies any abdominal pain, nausea, vomiting, fevers, or chills.    PT Comments    Pt friendly and willing to participate.  Pt presents I bed mobility and min guard with transfer training with just cueing for hand placement to assist with sit to stands for safety, increased time to complete due to weakness.  Pt presents with improved balance upon standing with steadiness without UE A.  Used RW with gait training for stability, no LOB episodes through session.  EOS pt left in chair, no reports of pain though was limited by fatigue.     Follow Up Recommendations  Outpatient PT     Equipment Recommendations  Rolling walker with 5" wheels    Recommendations for Other Services       Precautions / Restrictions Precautions Precautions: Fall Restrictions Weight Bearing Restrictions: No    Mobility  Bed Mobility Overal bed mobility: Independent             General bed mobility comments: increased time  Transfers Overall transfer level: Modified independent   Transfers: Sit to/from Stand Sit to Stand: Min guard         General transfer comment: cueing for mechanics to assist with sit to stand.  Improved steadiness upon standing.  Did use RW to assist wtih  balance during standing and gait  Ambulation/Gait Ambulation/Gait assistance: Supervision;Min guard Gait Distance (Feet): 150 Feet Assistive device: Rolling walker (2 wheeled) Gait Pattern/deviations: Decreased step length - right;Decreased step length - left;Decreased stride length Gait velocity: decreased   General Gait Details: slow labored movement, no LOB episodes   Stairs             Wheelchair Mobility    Modified Rankin (Stroke Patients Only)       Balance                                            Cognition Arousal/Alertness: Awake/alert Behavior During Therapy: WFL for tasks assessed/performed Overall Cognitive Status: Within Functional Limits for tasks assessed                                        Exercises      General Comments        Pertinent Vitals/Pain Pain Assessment: No/denies pain    Home Living                      Prior Function            PT Goals (current goals can now be found in the care plan section)  Frequency    Min 3X/week      PT Plan Current plan remains appropriate    Co-evaluation              AM-PAC PT "6 Clicks" Mobility   Outcome Measure  Help needed turning from your back to your side while in a flat bed without using bedrails?: None Help needed moving from lying on your back to sitting on the side of a flat bed without using bedrails?: None Help needed moving to and from a bed to a chair (including a wheelchair)?: A Little Help needed standing up from a chair using your arms (e.g., wheelchair or bedside chair)?: A Little Help needed to walk in hospital room?: A Little Help needed climbing 3-5 steps with a railing? : A Lot 6 Click Score: 19    End of Session Equipment Utilized During Treatment: Gait belt Activity Tolerance: Patient tolerated treatment well;Patient limited by fatigue Patient left: in chair;with call bell/phone within  reach Nurse Communication: Mobility status PT Visit Diagnosis: Unsteadiness on feet (R26.81);Other abnormalities of gait and mobility (R26.89);Muscle weakness (generalized) (M62.81)     Time: 8295-6213 PT Time Calculation (min) (ACUTE ONLY): 22 min  Charges:  $Therapeutic Activity: 8-22 mins                     [address] LPTA; CBIS [phone]  Aldona Lento 09/06/2018, 10:14 AM

## 2018-09-06 NOTE — Progress Notes (Addendum)
CRITICAL VALUE ALERT  Critical Value:  Lactic Acid 2.8  Date & Time Notied:  09/06/18  Provider Notified: Opyd  Orders Received/Actions taken: orders received

## 2018-09-06 NOTE — Progress Notes (Signed)
PROGRESS NOTE    [name] [name]  YHC:[id number] DOB: Jan 31, 1966 DOA: 09/04/2018 PCP: Royce Macadamia D., PA-C   Brief Narrative:  Per HPI: [name] [name] is a 53 y.o. female with medical history significant for hypertension, Graves' disease/hypothyroidism, IBS, gastroparesis, dyslipidemia, and questionable recent diagnosis of diabetes who was brought to the ED via EMS with altered mental status and progressive lethargy over the last 24 hours.  Patient was recently started on Lantus, but has not eaten in the last 24 hours and blood glucose was measured to be quite elevated.  Patient is somewhat somnolent and difficult to obtain history from, but was complaining of some dehydration earlier in the ED visit.  She denies any abdominal pain, nausea, vomiting, fevers, or chills.  Patient was admitted with acute encephalopathy secondary to DKA in the setting of newly diagnosed diabetes.  She is overall doing much better on this morning of 5/12 with DKA that has resolved.  She will be started on a diet and has been transitioned to SSI.  She continues to have significant hypernatremia and AKI that is resolving.  Assessment & Plan:   Principal Problem:   DKA (diabetic ketoacidoses) ([place]) Active Problems:   Gastroparesis   Hypothyroidism   AKI (acute kidney injury) ([place]) 1. Acute encephalopathy secondary to DKA in the setting of newly diagnosed diabetes-resolved; mentation is back to normal, patient alert, awake and oriented x3.  DKA process is now resolved.  Hemoglobin A1c more than 15.5; CBGs continue to be elevated.  Continue treatment with Lantus and a sliding scale insulin.  Lantus dose will be adjusted to 20 units and will continue to follow CBGs fluctuation.  Patient educated about modified carbohydrate diet and appropriate hydration 2. Mild lactic acidosis.  Likely related to above processes.  Will continue IVF's 3. AKI with hypernatremia-resolving.   Patient did appear to have some urinary  retention initially, but this appears to be more of a dehydration issue related to DKA.  She is noted to have good urine output now. Will hold diuretics, continue IVF's. Follow renal function and electrolytes trend.  4. Hypothyroidism. Continue Synthroid. 5. Gastroparesis.   Continue home Reglan with diet today. 6. Hypertension.  BP stable, will continue lopressor, due to hypernatremia and AKI will continue holding HCTZ. 7. Dyslipidemia.  Continue statin. 8. Physical deconditioning: Patient has been seen by physical therapy who recommended outpatient PT and the use of rolling walker.   DVT prophylaxis: Heparin Code Status: Full Family Communication: We will update husband Disposition Plan: Appreciate diabetes coordinator recommendations.  CBGs continue to fluctuate high, sodium level now back to normal and renal function still is stabilizing.  Hopefully discharge home in the next 24 to 48 hours.   Consultants:   None  Procedures:   None  Antimicrobials:   None   Subjective: Afebrile, no chest pain, no nausea, no vomiting.  Still feeling weak and deconditioned.  Renal function continues to improve, sodium level is still high and patient is still requiring adjustment to diabetes regimen due to elevated CBGs.  Objective: Vitals:   09/05/18 1930 09/05/18 2216 09/06/18 0521 09/06/18 1304  BP:  139/75 (!) 153/67 125/61  Pulse:  84 70 85  Resp:  20 16 16   Temp:  98.6 F (37 C) 98.5 F (36.9 C) 98.1 F (36.7 C)  TempSrc:  Oral Oral Oral  SpO2: 99% 98% 100% 100%  Weight:      Height:        Intake/Output Summary (Last 24  hours) at 09/06/2018 1635 Last data filed at 09/06/2018 1300 Gross per 24 hour  Intake 720 ml  Output 3 ml  Net 717 ml   Filed Weights   09/04/18 1003 09/04/18 1545 09/05/18 0500  Weight: 74.8 kg 78.8 kg 79.2 kg    Examination: General exam: Alert, awake, oriented x 3; feeling weak and tired.  No nausea, no vomiting, no chest pain; patient is  afebrile. Respiratory system: Clear to auscultation. Respiratory effort normal.  Good oxygen saturation on room air. Cardiovascular system:RRR. No murmurs, rubs, gallops. Gastrointestinal system: Abdomen is nondistended, soft and nontender. No organomegaly or masses felt. Normal bowel sounds heard. Central nervous system: Alert and oriented. No focal neurological deficits. Extremities: No C/C/E, +pedal pulses Skin: No rashes, lesions or ulcers Psychiatry: Judgement and insight appear normal. Mood & affect appropriate.    Data Reviewed: I have personally reviewed following labs and imaging studies  CBC: Recent Labs  Lab 09/04/18 1007 09/05/18 0458 09/06/18 0428  WBC 11.9* 17.9* 9.2  NEUTROABS 10.4*  --   --   HGB 14.4 12.8 12.7  HCT 52.3* 43.0 44.1  MCV 96.9 89.4 91.9  PLT 297 234 962   Basic Metabolic Panel: Recent Labs  Lab 09/04/18 1146 09/04/18 1411 09/04/18 1747 09/04/18 2145 09/05/18 0458 09/06/18 0428  NA  --  149* 156* 159* 161* 150*  K  --  3.9 3.5 3.1* 3.6 5.0  CL  --  107 118* 120* 121* 111  CO2  --  16* 21* 26 26 24   GLUCOSE  --  845* 420* 209* 193* 326*  BUN  --  77* 66* 58* 49* 27*  CREATININE  --  3.62* 2.82* 2.46* 2.18* 1.60*  CALCIUM  --  8.9 9.0 9.2 8.9 8.4*  MG 3.9*  --   --   --   --  2.5*   GFR: Estimated Creatinine Clearance: 42.3 mL/min (A) (by C-G formula based on SCr of 1.6 mg/dL (H)).   Liver Function Tests: Recent Labs  Lab 09/04/18 1125  AST 16  ALT 21  ALKPHOS 107  BILITOT 1.9*  PROT 8.3*  ALBUMIN 3.6   HbA1C: Recent Labs    09/04/18 1146  HGBA1C >15.5*   CBG: Recent Labs  Lab 09/05/18 1617 09/05/18 2214 09/06/18 0730 09/06/18 1121 09/06/18 1606  GLUCAP 258* 269* 361* 373* 185*   Sepsis Labs: Recent Labs  Lab 09/05/18 0458 09/06/18 0428  LATICACIDVEN 2.2* 2.8*    Recent Results (from the past 240 hour(s))  Urine Culture     Status: None   Collection Time: 09/04/18 11:07 AM  Result Value Ref Range Status    Specimen Description   Final    URINE, CATHETERIZED Performed at [facility], [address] [place], [place] [address]    Special Requests   Final    NONE Performed at [facility], [address], [place], [place] [address]    Culture   Final    NO GROWTH Performed at [facility], [place] [address] [place], [place] [address]    Report Status 09/05/2018 FINAL  Final  SARS Coronavirus 2 (CEPHEID- Performed in [facility] hospital lab), Hosp Order     Status: None   Collection Time: 09/04/18 12:33 PM  Result Value Ref Range Status   SARS Coronavirus 2 NEGATIVE NEGATIVE Final    Comment: (NOTE) If result is NEGATIVE SARS-CoV-2 target nucleic acids are NOT DETECTED. The SARS-CoV-2 RNA is generally detectable in upper and lower  respiratory specimens during the acute phase of infection. The lowest  concentration of SARS-CoV-2 viral copies this assay can detect is 250  copies / mL. A negative result does not preclude SARS-CoV-2 infection  and should not be used as the sole basis for treatment or other  patient management decisions.  A negative result may occur with  improper specimen collection / handling, submission of specimen other  than nasopharyngeal swab, presence of viral mutation(s) within the  areas targeted by this assay, and inadequate number of viral copies  (<250 copies / mL). A negative result must be combined with clinical  observations, patient history, and epidemiological information. If result is POSITIVE SARS-CoV-2 target nucleic acids are DETECTED. The SARS-CoV-2 RNA is generally detectable in upper and lower  respiratory specimens dur ing the acute phase of infection.  Positive  results are indicative of active infection with SARS-CoV-2.  Clinical  correlation with patient history and other diagnostic information is  necessary to determine patient infection status.  Positive results do  not rule out bacterial infection or co-infection with other viruses.  If result is PRESUMPTIVE POSTIVE SARS-CoV-2 nucleic acids MAY BE PRESENT.   A presumptive positive result was obtained on the submitted specimen  and confirmed on repeat testing.  While 2019 novel coronavirus  (SARS-CoV-2) nucleic acids may be present in the submitted sample  additional confirmatory testing may be necessary for epidemiological  and / or clinical management purposes  to differentiate between  SARS-CoV-2 and other Sarbecovirus currently known to infect humans.  If clinically indicated additional testing with an alternate test  methodology [id number]) is advised. The SARS-CoV-2 RNA is generally  detectable in upper and lower respiratory sp ecimens during the acute  phase of infection. The expected result is Negative. Fact Sheet for Patients:  StrictlyIdeas.no Fact Sheet for Healthcare Providers: BankingDealers.co.za This test is not yet approved or cleared by the [place] FDA and has been authorized for detection and/or diagnosis of SARS-CoV-2 by FDA under an Emergency Use Authorization (EUA).  This EUA will remain in effect (meaning this test can be used) for the duration of the COVID-19 declaration under Section 564(b)(1) of the Act, 21 U.S.C. section 360bbb-3(b)(1), unless the authorization is terminated or revoked sooner. Performed at [facility], [address], [place], [place] [address]   MRSA PCR Screening     Status: None   Collection Time: 09/04/18  2:57 PM  Result Value Ref Range Status   MRSA by PCR NEGATIVE NEGATIVE Final    Comment:        The GeneXpert MRSA Assay (FDA approved for NASAL specimens only), is one component of a comprehensive MRSA colonization surveillance program. It is not intended to diagnose MRSA infection nor to guide or monitor treatment for MRSA infections. Performed at [facility], [address], [place], [place] [address]      Scheduled Meds: . atorvastatin  20 mg Oral  Daily  . heparin  5,000 Units Subcutaneous Q8H  . insulin aspart  0-15 Units Subcutaneous TID WC  . insulin aspart  0-5 Units Subcutaneous QHS  . insulin aspart  3 Units Subcutaneous TID WC  . insulin glargine  12 Units Subcutaneous Q24H  . levothyroxine  88 mcg Oral QAC breakfast  . metoCLOPramide  2.5 mg Oral TID AC & HS  . metoprolol tartrate  25 mg Oral Daily  . pantoprazole  40 mg Oral Daily   Continuous Infusions: . sodium chloride 100 mL/hr at 09/06/18 1157  LOS: 2 days    Time spent: 30 minutes    Barton Dubois, MD [facility] Pager [phone]  09/06/2018, 4:35 PM

## 2018-09-06 NOTE — Care Management (Signed)
Patient's insurance will not cover DME. Patient reports her mother will get her a walker.

## 2018-09-07 ENCOUNTER — Telehealth (HOSPITAL_COMMUNITY): Payer: Self-pay | Admitting: *Deleted

## 2018-09-07 DIAGNOSIS — E870 Hyperosmolality and hypernatremia: Secondary | ICD-10-CM

## 2018-09-07 DIAGNOSIS — E860 Dehydration: Secondary | ICD-10-CM

## 2018-09-07 DIAGNOSIS — I10 Essential (primary) hypertension: Secondary | ICD-10-CM

## 2018-09-07 DIAGNOSIS — N179 Acute kidney failure, unspecified: Secondary | ICD-10-CM

## 2018-09-07 LAB — BASIC METABOLIC PANEL
Anion gap: 11 (ref 5–15)
BUN: 22 mg/dL — ABNORMAL HIGH (ref 6–20)
CO2: 25 mmol/L (ref 22–32)
Calcium: 8.1 mg/dL — ABNORMAL LOW (ref 8.9–10.3)
Chloride: 104 mmol/L (ref 98–111)
Creatinine, Ser: 1.35 mg/dL — ABNORMAL HIGH (ref 0.44–1.00)
GFR calc Af Amer: 52 mL/min — ABNORMAL LOW (ref >60)
GFR calc non Af Amer: 45 mL/min — ABNORMAL LOW (ref >60)
Glucose, Bld: 297 mg/dL — ABNORMAL HIGH (ref 70–99)
Potassium: 3 mmol/L — ABNORMAL LOW (ref 3.5–5.1)
Sodium: 140 mmol/L (ref 135–145)

## 2018-09-07 LAB — GLUCOSE, CAPILLARY
Glucose-Capillary: 283 mg/dL — ABNORMAL HIGH (ref 70–99)
Glucose-Capillary: 341 mg/dL — ABNORMAL HIGH (ref 70–99)
Glucose-Capillary: 173 mg/dL — ABNORMAL HIGH (ref 70–99)

## 2018-09-07 MED ORDER — METFORMIN HCL 500 MG PO TABS: 500.0000 mg | ORAL_TABLET | Freq: Two times a day (BID) | ORAL | 3 refills | Status: DC

## 2018-09-07 MED ORDER — INSULIN PEN NEEDLE 32G X 4 MM MISC: 3 refills | Status: DC

## 2018-09-07 MED ORDER — BLOOD GLUCOSE METER KIT: PACK | 0 refills | Status: AC

## 2018-09-07 MED ORDER — POTASSIUM CHLORIDE CRYS ER 20 MEQ PO TBCR
40.0000 meq | EXTENDED_RELEASE_TABLET | Freq: Once | ORAL | Status: AC
  Administered 2018-09-07: 40 meq via ORAL
  Filled 2018-09-07: qty 2

## 2018-09-07 MED ORDER — LOSARTAN POTASSIUM 25 MG PO TABS: 25.0000 mg | ORAL_TABLET | Freq: Every day | ORAL | 3 refills | Status: DC

## 2018-09-07 MED ORDER — INSULIN ISOPHANE & REGULAR (HUMAN 70-30)100 UNIT/ML KWIKPEN: 25.0000 [IU] | PEN_INJECTOR | Freq: Two times a day (BID) | SUBCUTANEOUS | 11 refills | Status: DC

## 2018-09-07 MED ORDER — PANTOPRAZOLE SODIUM 40 MG PO TBEC: 40.0000 mg | DELAYED_RELEASE_TABLET | Freq: Two times a day (BID) | ORAL | 1 refills | Status: DC

## 2018-09-07 NOTE — Discharge Summary (Signed)
Physician Discharge Summary  [name] [name] OVF:[id number] DOB: Mar 22, 1966 DOA: 09/04/2018  PCP: Royce Macadamia D., PA-C  Admit date: 09/04/2018 Discharge date: 09/07/2018  Time spent: 35 minutes  Recommendations for Outpatient Follow-up:  1. Repeat basic metabolic panel to follow electrolytes and renal function 2. Reassess blood pressure and adjust antihypertensive regimen as needed 3. Close follow-up the patient's CBGs/A1c with further adjustment to hypoglycemic regimen as needed. 4. Patient will need outpatient follow-up with ophthalmologist and podiatrist.   Discharge Diagnoses:  Principal Problem:   DKA (diabetic ketoacidoses) ([facility]) Active Problems:   Gastroparesis   Hypothyroidism   AKI (acute kidney injury) ([place])   Acute renal failure ([place])   Benign essential HTN   Dehydration with hypernatremia   Discharge Condition: Stable and improved.  Patient discharged home with instruction to follow-up with PCP in 10 days.  Diet recommendation: Heart healthy and modified carbohydrate diet.  Filed Weights   09/04/18 1003 09/04/18 1545 09/05/18 0500  Weight: 74.8 kg 78.8 kg 79.2 kg    History of present illness:  As per H&P written by Dr. Manuella Ghazi on 09/04/2018 53 y.o. female with medical history significant for hypertension, Graves' disease/hypothyroidism, IBS, gastroparesis, dyslipidemia, and questionable recent diagnosis of diabetes who was brought to the ED via EMS with altered mental status and progressive lethargy over the last 24 hours.  Patient was recently started on Lantus, but has not eaten in the last 24 hours and blood glucose was measured to be quite elevated.  Patient is somewhat somnolent and difficult to obtain history from, but was complaining of some dehydration earlier in the ED visit.  She denies any abdominal pain, nausea, vomiting, fevers, or chills.   ED Course: Patient noted to have a blood glucose of 1386 on labs with signs of acidosis.  She was also noted to  have some urinary retention and had in and out catheterization with 800 cc of urine removed at that time and urine analysis is currently pending.  She is noted to have signs of AKI with BUN of 90 and creatinine 4.65.  She was recently seen for right lower extremity pain and was not noted to have any acute abnormalities and was sent home with pain medications just yesterday.  She has been started on IV fluid and insulin drip.  Hospital Course:  1-type II DKA without coma: -Patient appropriately treated with insulin drip and fluid resuscitation -DKA process resolved -Hemoglobin A1c 15.5 -Patient instructed to keep herself well-hydrated and to follow modified carbohydrate diet -Based on insurance coverage and financial affordability has been discharged on 70/30 25 units twice a day and metformin 500 mg twice a day. -Patient will require close follow-up by PCP to further adjust hypoglycemic regimen as needed. -Prescription for glucometer, strips and lancets provided at time of discharge.  2-dehydration/hypernatremia -Related to DKA process and poor oral intake. -Process resolve after fluid resuscitation. -Patient advised to keep herself well-hydrated.  3-acute kidney injury -Prerenal azotemia in the setting of dehydration and DKA. -Diuretics has been discontinued at time of discharge -Patient instructed to follow low-sodium diet and maintain adequate hydration -Creatinine 1.3 at discharge. -Will recommend repeat basic metabolic panel follow-up visit to reassess electrolytes and renal function trend.  4-essential hypertension -Stable and well-controlled patient will be discharged on metoprolol and Cozaar. -Reassess blood pressure and further adjust antihypertensive regimen as needed. -Patient instructed to follow heart healthy diet. -Diuretics regimen has been discontinued at time of discharge.  5-gastroparesis -In the setting of uncontrolled diabetes. -Continue Reglan  as prescribed prior  to admission -Discussed with patient multiple small meals throughout the day to facilitate GI transit. -Continue as needed antiemetics and the use of PPI.  6-hyperlipidemia -Continue statin.  7-hypothyroidism -Continue Synthroid.  8-physical deconditioning patient seen and evaluated by physical therapy -Recommendations given for outpatient PT and the use of rolling walker.  Procedures:  See below for x-ray reports.  Consultations:  Diabetes coordinator  Discharge Exam: Vitals:   09/07/18 0539 09/07/18 1315  BP: 136/75 118/76  Pulse: 87 81  Resp: 16 16  Temp: 98.3 F (36.8 C) 98.2 F (36.8 C)  SpO2: 98% 100%   General exam: Alert, awake, oriented x 3; still feeling tired; but overall improved.  No nausea, no vomiting, no chest pain; patient is afebrile.  Tolerating diet without any problems. Respiratory system: Clear to auscultation. Respiratory effort normal.  Good oxygen saturation on room air. Cardiovascular system:RRR. No murmurs, rubs, gallops. Gastrointestinal system: Abdomen is nondistended, soft and nontender. No organomegaly or masses felt. Normal bowel sounds heard. Central nervous system: Alert and oriented. No focal neurological deficits. Extremities: No C/C/E, +pedal pulses Skin: No rashes, lesions or ulcers Psychiatry: Judgement and insight appear normal. Mood & affect appropriate.    Discharge Instructions   Discharge Instructions    Ambulatory referral to Physical Therapy   Complete by:  As directed    Diet - low sodium heart healthy   Complete by:  As directed    Diet Carb Modified   Complete by:  As directed    Discharge instructions   Complete by:  As directed    Take medications as prescribed Keep yourself well-hydrated Follow modified carbohydrate diet Be compliant with your blood sugar checks (fasting in the morning, before lunch and prior to bedtime) and insulin injections as instructed. Follow-up with PCP in 10 days.     Allergies as  of 09/07/2018      Reactions   Codeine Nausea And Vomiting      Medication List    STOP taking these medications   conjugated estrogens vaginal cream Commonly known as:  Premarin   dexamethasone 4 MG tablet Commonly known as:  DECADRON   fluconazole 150 MG tablet Commonly known as:  DIFLUCAN   hydrochlorothiazide 25 MG tablet Commonly known as:  HYDRODIURIL   HYDROcodone-acetaminophen 5-325 MG tablet Commonly known as:  NORCO/VICODIN   HYDROcodone-homatropine 5-1.5 MG/5ML syrup Commonly known as:  HYCODAN   insulin glargine 100 UNIT/ML injection Commonly known as:  LANTUS   methocarbamol 500 MG tablet Commonly known as:  ROBAXIN     TAKE these medications   acetaminophen 500 MG tablet Commonly known as:  TYLENOL Take 1,000 mg by mouth every 6 (six) hours as needed for headache.   atorvastatin 20 MG tablet Commonly known as:  LIPITOR Take 20 mg by mouth daily.   blood glucose meter kit and supplies Use to check your blood sugar three times a day as directed. (FOR ICD-10 E10.9, E11.9).   cetirizine 10 MG tablet Commonly known as:  ZYRTEC Take 10 mg by mouth daily.   estradiol 1 MG tablet Commonly known as:  ESTRACE Take 1 tablet (1 mg total) by mouth daily.   Insulin Isophane & Regular Human (70-30) 100 UNIT/ML PEN Commonly known as:  NovoLIN 70/30 FlexPen Relion Inject 25 Units into the skin 2 (two) times a day.   Insulin Pen Needle 32G X 4 MM Misc Commonly known as:  ReliOn Pen Needles Use twice a day as instructed  to inject insulin.   levothyroxine 88 MCG tablet Commonly known as:  SYNTHROID Take 88 mcg by mouth daily.   losartan 25 MG tablet Commonly known as:  Cozaar Take 1 tablet (25 mg total) by mouth daily.   metFORMIN 500 MG tablet Commonly known as:  Glucophage Take 1 tablet (500 mg total) by mouth 2 (two) times daily with a meal.   metoCLOPramide 5 MG tablet Commonly known as:  REGLAN Take 0.5 tablets by mouth 4 (four) times daily -   before meals and at bedtime. What changed:  Another medication with the same name was removed. Continue taking this medication, and follow the directions you see here.   metoprolol tartrate 25 MG tablet Commonly known as:  LOPRESSOR Take 25 mg by mouth daily.   ondansetron 4 MG disintegrating tablet Commonly known as:  Zofran ODT Take 1 tablet (4 mg total) by mouth every 8 (eight) hours as needed. What changed:  Another medication with the same name was removed. Continue taking this medication, and follow the directions you see here.   pantoprazole 40 MG tablet Commonly known as:  PROTONIX Take 1 tablet (40 mg total) by mouth 2 (two) times daily.            Durable Medical Equipment  (From admission, onward)         Start     Ordered   09/05/18 1425  For home use only DME Walker rolling  Once    Question:  Patient needs a walker to treat with the following condition  Answer:  Weakness   09/05/18 1428         Allergies  Allergen Reactions  . Codeine Nausea And Vomiting   Follow-up Information    Raiford Simmonds., PA-C. Schedule an appointment as soon as possible for a visit in 10 day(s).   Contact information: [address] [place] [address] [phone]           The results of significant diagnostics from this hospitalization (including imaging, microbiology, ancillary and laboratory) are listed below for reference.    Significant Diagnostic Studies: Dg Foot Complete Right  Result Date: 09/03/2018 CLINICAL DATA:  Acute RIGHT foot pain. No known injury. Initial encounter. EXAM: RIGHT FOOT COMPLETE - 3+ VIEW COMPARISON:  None. FINDINGS: No acute fracture, subluxation or dislocation. No focal bony lesions are identified. The Lisfranc joints and calcaneus are unremarkable. IMPRESSION: Negative. Electronically Signed   By: Margarette Canada M.D.   On: 09/03/2018 12:50   Dg Hip Unilat With Pelvis 2-3 Views Right  Result Date: 09/03/2018 CLINICAL DATA:  RIGHT hip pain.   Difficulty walking. EXAM: DG HIP (WITH OR WITHOUT PELVIS) 2-3V RIGHT COMPARISON:  None. FINDINGS: Hips are located. No pelvic fracture or sacral fracture. Dedicated views of the RIGHT hip demonstrate no fracture. Mild sclerosis of the acetabular roofs. IMPRESSION: Mild osteoarthritis of the hips.  No acute findings. Electronically Signed   By: Suzy Bouchard M.D.   On: 09/03/2018 10:26    Microbiology: Recent Results (from the past 240 hour(s))  Urine Culture     Status: None   Collection Time: 09/04/18 11:07 AM  Result Value Ref Range Status   Specimen Description   Final    URINE, CATHETERIZED Performed at [facility], [address], [place], [place] [id number]    Special Requests   Final    NONE Performed at [facility], [address] [place], [place] [address]    Culture   Final  NO GROWTH Performed at [facility], [place] [address], [place], [place] [address]    Report Status 09/05/2018 FINAL  Final  SARS Coronavirus 2 (CEPHEID- Performed in [place] hospital lab), Hosp Order     Status: None   Collection Time: 09/04/18 12:33 PM  Result Value Ref Range Status   SARS Coronavirus 2 NEGATIVE NEGATIVE Final    Comment: (NOTE) If result is NEGATIVE SARS-CoV-2 target nucleic acids are NOT DETECTED. The SARS-CoV-2 RNA is generally detectable in upper and lower  respiratory specimens during the acute phase of infection. The lowest  concentration of SARS-CoV-2 viral copies this assay can detect is 250  copies / mL. A negative result does not preclude SARS-CoV-2 infection  and should not be used as the sole basis for treatment or other  patient management decisions.  A negative result may occur with  improper specimen collection / handling, submission of specimen other  than nasopharyngeal swab, presence of viral mutation(s) within the  areas targeted by this assay, and inadequate number of viral copies  (<250 copies / mL). A negative result must be combined with clinical   observations, patient history, and epidemiological information. If result is POSITIVE SARS-CoV-2 target nucleic acids are DETECTED. The SARS-CoV-2 RNA is generally detectable in upper and lower  respiratory specimens dur ing the acute phase of infection.  Positive  results are indicative of active infection with SARS-CoV-2.  Clinical  correlation with patient history and other diagnostic information is  necessary to determine patient infection status.  Positive results do  not rule out bacterial infection or co-infection with other viruses. If result is PRESUMPTIVE POSTIVE SARS-CoV-2 nucleic acids MAY BE PRESENT.   A presumptive positive result was obtained on the submitted specimen  and confirmed on repeat testing.  While 2019 novel coronavirus  (SARS-CoV-2) nucleic acids may be present in the submitted sample  additional confirmatory testing may be necessary for epidemiological  and / or clinical management purposes  to differentiate between  SARS-CoV-2 and other Sarbecovirus currently known to infect humans.  If clinically indicated additional testing with an alternate test  methodology [phone]) is advised. The SARS-CoV-2 RNA is generally  detectable in upper and lower respiratory sp ecimens during the acute  phase of infection. The expected result is Negative. Fact Sheet for Patients:  StrictlyIdeas.no Fact Sheet for Healthcare Providers: BankingDealers.co.za This test is not yet approved or cleared by the [place] FDA and has been authorized for detection and/or diagnosis of SARS-CoV-2 by FDA under an Emergency Use Authorization (EUA).  This EUA will remain in effect (meaning this test can be used) for the duration of the COVID-19 declaration under Section 564(b)(1) of the Act, 21 U.S.C. section 360bbb-3(b)(1), unless the authorization is terminated or revoked sooner. Performed at [facility], [address]  [place], [place] [address]   MRSA PCR Screening     Status: None   Collection Time: 09/04/18  2:57 PM  Result Value Ref Range Status   MRSA by PCR NEGATIVE NEGATIVE Final    Comment:        The GeneXpert MRSA Assay (FDA approved for NASAL specimens only), is one component of a comprehensive MRSA colonization surveillance program. It is not intended to diagnose MRSA infection nor to guide or monitor treatment for MRSA infections. Performed at [facility], [address], [place],  [address]      Labs: Basic Metabolic Panel: Recent Labs  Lab 09/04/18 1146  09/04/18 1747 09/04/18 2145 09/05/18  8937 09/06/18 0428 09/07/18 0456  NA  --    < > 156* 159* 161* 150* 140  K  --    < > 3.5 3.1* 3.6 5.0 3.0*  CL  --    < > 118* 120* 121* 111 104  CO2  --    < > 21* '26 26 24 25  '$ GLUCOSE  --    < > 420* 209* 193* 326* 297*  BUN  --    < > 66* 58* 49* 27* 22*  CREATININE  --    < > 2.82* 2.46* 2.18* 1.60* 1.35*  CALCIUM  --    < > 9.0 9.2 8.9 8.4* 8.1*  MG 3.9*  --   --   --   --  2.5*  --    < > = values in this interval not displayed.   Liver Function Tests: Recent Labs  Lab 09/04/18 1125  AST 16  ALT 21  ALKPHOS 107  BILITOT 1.9*  PROT 8.3*  ALBUMIN 3.6   CBC: Recent Labs  Lab 09/04/18 1007 09/05/18 0458 09/06/18 0428  WBC 11.9* 17.9* 9.2  NEUTROABS 10.4*  --   --   HGB 14.4 12.8 12.7  HCT 52.3* 43.0 44.1  MCV 96.9 89.4 91.9  PLT 297 234 164   CBG: Recent Labs  Lab 09/06/18 1121 09/06/18 1606 09/06/18 2114 09/07/18 0737 09/07/18 1112  GLUCAP 373* 185* 243* 283* 341*    Signed:  Barton Dubois MD.  [facility] 09/07/2018, 2:47 PM

## 2018-09-07 NOTE — Progress Notes (Signed)
Inpatient Diabetes Program Recommendations  AACE/ADA: New Consensus Statement on Inpatient Glycemic Control (2015)  Target Ranges:  Prepandial:   less than 140 mg/dL      Peak postprandial:   less than 180 mg/dL (1-2 hours)      Critically ill patients:  140 - 180 mg/dL   Results for [name] [name], [name] [name] (MRN [id number]) as of 09/07/2018 11:26  Ref. Range 09/06/2018 07:30 09/06/2018 11:21 09/06/2018 16:06 09/06/2018 21:14  Glucose-Capillary Latest Ref Range: 70 - 99 mg/dL 361 (H)  18 units NOVOLOG  373 (H)  18 units NOVOLOG  185 (H)  6 units NOVOLOG  243 (H)  2 units NOVOLOG +  20 units LANTUS    Results for [name] [name], [name] [name] (MRN [id number]) as of 09/07/2018 11:26  Ref. Range 09/07/2018 07:37 09/07/2018 11:12  Glucose-Capillary Latest Ref Range: 70 - 99 mg/dL 283 (H)  11 units NOVOLOG  341 (H)    Diabetes history: New DM 2 diagnosis   Current Orders: Lantus 20 units QHS     Novolog Moderate Correction Scale/ SSI (0-15 units) TID AC + HS     Novolog 3 units TID with meals     DM Coordinator has spoken with patient the last 2 days about her new diagnosis of Diabetes.  RNs have been actively working with pt on self-injection of insulin and fingerstick CBGs.    MD- Please consider the following in-hospital insulin adjustments:  1. Increase Lantus to 25 units QHS  2. Increase Novolog Meal Coverage to: Novolog 6 units TID with meals  (Please add the following Hold Parameters: Hold if pt eats <50% of meal, Hold if pt NPO)    --Will follow patient during hospitalization--  Wyn Quaker RN, MSN, CDE Diabetes Coordinator Inpatient Glycemic Control Team Team Pager: [phone] (8a-5p)

## 2018-09-07 NOTE — Progress Notes (Signed)
Paged by Dr. Dyann Kief for assistance with converting pt to less expensive insulin regimen for home.  Also discussed with Care Manager.  Dr. Dyann Kief asked me to send him a Secure Chat with recs for conversion to 70/30 insulin.  Secure Chat message sent to Dr. Dyann Kief at 2:22pm with recs.     --Will follow patient during hospitalization--  Wyn Quaker RN, MSN, CDE Diabetes Coordinator Inpatient Glycemic Control Team Team Pager: [phone] (8a-5p)

## 2018-09-07 NOTE — Progress Notes (Signed)
Physical Therapy Treatment Patient Details Name: [name] [name] MRN: [id number] DOB: Apr 08, 1966 Today's Date: 09/07/2018    History of Present Illness [name] [name] is a 53 y.o. female with medical history significant for hypertension, Graves' disease/hypothyroidism, IBS, gastroparesis, dyslipidemia, and questionable recent diagnosis of diabetes who was brought to the ED via EMS with altered mental status and progressive lethargy over the last 24 hours.  Patient was recently started on Lantus, but has not eaten in the last 24 hours and blood glucose was measured to be quite elevated.  Patient is somewhat somnolent and difficult to obtain history from, but was complaining of some dehydration earlier in the ED visit.  She denies any abdominal pain, nausea, vomiting, fevers, or chills.    PT Comments    Pt sitting in chair, friendly and willing to participate with therapy today.  No reports of pain.  Gait training with LRAD.  Pt educated on proper cane height fitting and sequencing with initially 3-point then 2-point sequence with good mechanics following initial instructions.  No LOB episodes, pt did reach for wall on turn for stability.  Also address stair training this session as pt has 2 steps to enter home.  Pt able to demonstrate step to pattern ascending and descending with good mechanics.  Encouraged pt to ask for handrails to assist with landlord for safety.  EOS pt left in chair with slight fatigue, no reports of pain.   Follow Up Recommendations  Outpatient PT     Equipment Recommendations  Rolling walker with 5" wheels    Recommendations for Other Services       Precautions / Restrictions Precautions Precautions: Fall Restrictions Weight Bearing Restrictions: No    Mobility  Bed Mobility               General bed mobility comments: sitting in chair upon entrance  Transfers Overall transfer level: Independent   Transfers: Sit to/from Stand Sit to Stand:  Supervision         General transfer comment: safe mechanics for standing  Ambulation/Gait   Gait Distance (Feet): 150 Feet Assistive device: Quad cane Gait Pattern/deviations: Decreased step length - right;Decreased step length - left;Decreased stride length Gait velocity: decreased   General Gait Details: increased cadence, no LOB episodes.  did use wall to stable around first turn   Stairs Stairs: Yes Stairs assistance: Supervision Stair Management: Two rails;Step to pattern Number of Stairs: 4 General stair comments: step to pattern ascending and descending with both rails   Wheelchair Mobility    Modified Rankin (Stroke Patients Only)       Balance                                            Cognition Arousal/Alertness: Awake/alert Behavior During Therapy: WFL for tasks assessed/performed Overall Cognitive Status: Within Functional Limits for tasks assessed                                        Exercises      General Comments        Pertinent Vitals/Pain Pain Assessment: No/denies pain    Home Living                      Prior Function  PT Goals (current goals can now be found in the care plan section)      Frequency    Min 3X/week      PT Plan Current plan remains appropriate    Co-evaluation              AM-PAC PT "6 Clicks" Mobility   Outcome Measure  Help needed turning from your back to your side while in a flat bed without using bedrails?: None Help needed moving from lying on your back to sitting on the side of a flat bed without using bedrails?: None Help needed moving to and from a bed to a chair (including a wheelchair)?: A Little Help needed standing up from a chair using your arms (e.g., wheelchair or bedside chair)?: A Little Help needed to walk in hospital room?: A Little Help needed climbing 3-5 steps with a railing? : A Lot 6 Click Score: 19    End of  Session Equipment Utilized During Treatment: Gait belt Activity Tolerance: Patient tolerated treatment well;Patient limited by fatigue;No increased pain Patient left: in chair;with call bell/phone within reach Nurse Communication: Mobility status PT Visit Diagnosis: Unsteadiness on feet (R26.81);Other abnormalities of gait and mobility (R26.89);Muscle weakness (generalized) (M62.81)     Time: 2202-5427 PT Time Calculation (min) (ACUTE ONLY): 18 min  Charges:  $Therapeutic Activity: 8-22 mins                     [address] LPTA; CBIS [phone]  Aldona Lento 09/07/2018, 9:10 AM

## 2018-09-07 NOTE — TOC Transition Note (Signed)
Transition of Care [facility]) - CM/SW Discharge Note   Patient Details  Name: [name] [name] MRN: [id number] Date of Birth: 1965/10/18  [facility] of [facility]) CM/SW Contact:  Savir Blanke, Chauncey Reading, RN Phone Number: 09/07/2018, 2:10 PM   Clinical Narrative:   Patient discharging home. Benefits check done. Insurance will only cover $25-50 of medication, patient will be responsible for the remaining amount. Discussed with diabetes coordinator, she will make new note with recommendations. Attending notified.       Final next level of care: OP Rehab Barriers to Discharge: No Barriers Identified

## 2018-09-07 NOTE — TOC Benefit Eligibility Note (Signed)
Transition of Care [facility]) Benefit Eligibility Note    Patient Details  Name: [name] [name] MRN: [id number] Date of Birth: 01/22/1966   Medication/Dose:   1-       Lantus 20 units at bedtime                                      2-   Novolog 3 units three times a day and at bedtime        Prescription Coverage Preferred Pharmacy: n/a  Spoke with Person/Company/Phone Number:: Marium with [employer] at [phone]  Co-Pay: n/a        Additional Notes: $50 max toward brand name drugs and $25 max toward generic name drugs is how this plan works, rep could only quote price on Lantus because of a  previous submission for this drug.  Lantus would be $346.93 ($50 max included)    Tommy Medal Phone Number: 09/07/2018, 1:48 PM

## 2018-09-07 NOTE — Telephone Encounter (Signed)
09/07/18  Called to verify insurance and found out that her insurance doesn't cover PT.  I left a message explaining that she would be considered self-pay and the visits would average out to about $90 per visit.  She just needs to let us know if she wants to still come tomorrow for the eval or what she would like to do.

## 2018-09-07 NOTE — Care Management (Signed)
Follow up appt scheduled.

## 2018-09-07 NOTE — Progress Notes (Signed)
PT DISCHARGED HOME, IV REMOVED, ANGIO INTACT, VS STABLE, DENIES C/O DISCOMFORT, PT VERBALIZED UNDERSTANDING OF ALL INSTRUCTIONS PROVIDED. PT LEFT FLOOR VIA WHEELCHAIR WITH BELONGINGS ACCOMPANIED BY NURSING STAFF. PT MET BY SPOUSE AT SHORT STAY ENTRANCE FOR TRANSPORTATION HOME.

## 2018-09-08 ENCOUNTER — Ambulatory Visit (HOSPITAL_COMMUNITY): Payer: No Typology Code available for payment source

## 2018-09-08 ENCOUNTER — Encounter (HOSPITAL_COMMUNITY): Payer: Self-pay

## 2018-09-08 ENCOUNTER — Other Ambulatory Visit: Payer: Self-pay

## 2018-09-08 DIAGNOSIS — R2689 Other abnormalities of gait and mobility: Secondary | ICD-10-CM

## 2018-09-08 DIAGNOSIS — R2681 Unsteadiness on feet: Secondary | ICD-10-CM

## 2018-09-08 LAB — HIV 1/2 AB DIFFERENTIATION
HIV 1 Ab: NEGATIVE
HIV 2 Ab: NEGATIVE
Note: NEGATIVE

## 2018-09-08 LAB — RNA QUALITATIVE: HIV 1 RNA Qualitative: 1

## 2018-09-08 LAB — HIV ANTIBODY (ROUTINE TESTING W REFLEX): HIV Screen 4th Generation wRfx: REACTIVE — AB

## 2018-09-08 NOTE — Therapy (Signed)
[place], [place], [address] Phone: [phone]   Fax:  [phone]  Physical Therapy Evaluation  Patient Details  Name: [name] [name] MRN: [id number] Date of Birth: [birth year] Referring Provider (PT): Heath Lark D, DO   Encounter Date: 09/08/2018  PT End of Session - 09/08/18 1701    Visit Number  1    Number of Visits  6    Date for PT Re-Evaluation  10/20/18    Authorization Type  [employer] (NO PT Coverage- pt is self pay)    Authorization Time Period  09/08/18-10/20/18    Authorization - Visit Number  1    Authorization - Number of Visits  6    PT Start Time  5188    PT Stop Time  1429    PT Time Calculation (min)  32 min    Equipment Utilized During Treatment  Gait belt    Activity Tolerance  Patient tolerated treatment well    Behavior During Therapy  [facility] for tasks assessed/performed       Past Medical History:  Diagnosis Date  . Grave's disease    diagnosed years ago/ now hypothyroidism  . Hypertension   . IBS (irritable colon syndrome)    constipation predominant  . Nondiabetic gastroparesis     Past Surgical History:  Procedure Laterality Date  . ABDOMINAL HYSTERECTOMY    . attempted colonoscopy  03/2004   Dr. Rourk--> prep was poor. Exam to 40 cm. No gross abnormalities. Internal hemorrhoids. Air contrast barium enema was normal.  . CHOLECYSTECTOMY    . COLONOSCOPY N/A 10/30/2014   RMR: normal  . ESOPHAGEAL DILATION N/A 10/30/2014   Procedure: ESOPHAGEAL DILATION;  Surgeon: Daneil Dolin, MD;  [facility];  Service: Endoscopy;  Laterality: N/A;  . ESOPHAGOGASTRODUODENOSCOPY  01/2004   Dr. Almyra Free small polyps in postbulbar area ablated  . ESOPHAGOGASTRODUODENOSCOPY N/A 10/30/2014   [id number]  . S/P Hysterectomy     . TONSILLECTOMY    . TUBAL LIGATION      There were no vitals filed for this visit.   Subjective Assessment - 09/08/18 1400    Subjective   Patient reports she was admitted this Monday to [facility] for diabetic ketoacidosis and discharged yesterday. She has reported feeling unsteady and off balance since being admitted to the hospital. She reports her blood sugar remains high and regularly goes into the 300's. She states she checks it at least 2x daily and keeps a log and also keeps a log of her food as she watches this as well.     Currently in Pain?  No/denies         [facility] PT Assessment - 09/08/18 0001      Assessment   Medical Diagnosis  Unsteadiness secondary to Diabetic ketoaccidosis    Referring Provider (PT)  Heath Lark D, DO    Onset Date/Surgical Date  09/04/18    Next MD Visit  none    Prior Therapy  for back >5 years ago      Precautions   Precautions  None      Restrictions   Weight Bearing Restrictions  No      Balance Screen   Has the patient fallen in the past 6 months  No    Has the patient had a decrease in activity level because of a fear of falling?   Yes    Is the patient reluctant to leave their home because  of a fear of falling?   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; was a Designer, multimedia 2  worked with a lot of glass (doctor wooried she would get cut) no plan to return to work    Leisure  [employer], work in garden, walk [employer] trail      Cognition   Overall Cognitive Status  Within Functional Limits for tasks assessed      Standardized Balance Assessment   Standardized Balance Assessment  Dynamic Gait Index      Dynamic Gait Index   Level Surface  Normal    Change in Gait Speed  Normal    Gait with Horizontal Head Turns  Mild Impairment    Gait with Vertical Head Turns  Mild Impairment    Gait and Pivot Turn  Mild Impairment    Step Over Obstacle  Mild Impairment    Step Around Obstacles  Normal     Steps  Mild Impairment    Total Score  19    DGI comment:  socrd of 19 indicates patient is at fall risk      Functional Gait  Assessment   Gait assessed   Yes    Gait Level Surface  Walks 20 ft in less than 5.5 sec, no assistive devices, good speed, no evidence for imbalance, normal gait pattern, deviates no more than 6 in outside of the 12 in walkway width.    Change in Gait Speed  Able to smoothly change walking speed without loss of balance or gait deviation. Deviate no more than 6 in outside of the 12 in walkway width.    Gait with Horizontal Head Turns  Performs head turns smoothly with slight change in gait velocity (eg, minor disruption to smooth gait path), deviates 6-10 in outside 12 in walkway width, or uses an assistive device.    Gait with Vertical Head Turns  Performs task with slight change in gait velocity (eg, minor disruption to smooth gait path), deviates 6 - 10 in outside 12 in walkway width or uses assistive device    Gait and Pivot Turn  Pivot turns safely in greater than 3 sec and stops with no loss of balance, or pivot turns safely within 3 sec and stops with mild imbalance, requires small steps to catch balance.    Step Over Obstacle  Is able to step over one shoe box (4.5 in total height) without changing gait speed. No evidence of imbalance.    Gait with Narrow Base of Support  Ambulates 7-9 steps.    Gait with Eyes Closed  Walks 20 ft, uses assistive device, slower speed, mild gait deviations, deviates 6-10 in outside 12 in walkway width. Ambulates 20 ft in less than 9 sec but greater than 7 sec.    Ambulating Backwards  Walks 20 ft, uses assistive device, slower speed, mild gait deviations, deviates 6-10 in outside 12 in walkway width.    Steps  Alternating feet, must use rail.    Total Score  22    FGA comment:  score indicates patient is at medium fall risk        Objective measurements completed on examination: See above findings.     PT Education - 09/08/18  1717    Education Details  Patient provided medbridge education handouts on diabetes education for: exercis, medication management, and nutrition.     Person(s) Educated  Patient    Methods  Explanation;Handout    Comprehension  Verbalized understanding       PT Short Term Goals - 09/08/18 1718      PT SHORT TERM GOAL #1   Title  Patient will follow up with her MD about medication management to develop plan to better control blood sugar.     Time  2    Period  Weeks    Status  New    Target Date  09/22/18        PT Long Term Goals - 09/08/18 1719      PT LONG TERM GOAL #1   Title  Patient will verablize understanding of improtance of regular exercise for health and wellness to prevent complications related to DM.    Time  6    Period  Weeks    Status  New    Target Date  10/20/18      PT LONG TERM GOAL #2   Title  Patient will improve DGI to 23/24 or greater to indicate significant change in balance with gait and reduced fall risk.    Time  6    Period  Weeks    Status  New      PT LONG TERM GOAL #3   Title  Patient will improve FGA of 26/28 or greater to indicate significant change in balance with gait and reduced fall risk.    Time  6    Period  Weeks    Status  New         Plan - 09/08/18 1705    Clinical Impression Statement  Ms. Keep presents to physical therapy for evaluation of gait and balance following recent hospital admission due to diabetic ketoacidosis. Patient demonstrates slightly impaired gait and slow cadence throughout session. She scored a 19/24 on the DGI and 22/28 on the FGA both indicating she is at risk for falling. She has expressed concern over blood sugar management and would benefit from follow up with her PCP to adjust medication to better control blood sugar levels. She will benefit from skilled PT interventions to address balance impairments and improve safety with gait and functional mobility.    Personal Factors and Comorbidities   Comorbidity 2;Age;Fitness    Comorbidities  HTN, DM    Examination-Activity Limitations  Locomotion Level;Stairs;Stand;Reach Overhead;Lift;Carry;Bend    Examination-Participation Restrictions  Driving;Community Activity    Stability/Clinical Decision Making  Stable/Uncomplicated    Clinical Decision Making  Low    Rehab Potential  Good    PT Frequency  1x / week    PT Duration  6 weeks    PT Treatment/Interventions  ADLs/Self Care Home Management;Stair training;Functional mobility training;Therapeutic activities;Therapeutic exercise;Balance training;Neuromuscular re-education;Patient/family education;Manual techniques;Passive range of motion;Vestibular    PT Next Visit Plan  Review educational handouts on diabetes. Follow up on blood sugar readings. Follow up on patient discussing medication management with MD. Begin balance training and strength training for stair mobility.    Consulted and Agree with Plan of Care  Patient       Patient will benefit from skilled therapeutic intervention in order to improve the following deficits and impairments:  Abnormal gait, Decreased activity tolerance, Decreased endurance, Decreased balance, Decreased mobility, Dizziness, Difficulty walking  Visit Diagnosis: Unsteadiness on feet  Other abnormalities of gait and mobility  Problem List Patient Active Problem List   Diagnosis Date Noted  . Acute renal failure ([place])   . Benign essential HTN   . Dehydration with hypernatremia   . DKA (diabetic ketoacidoses) ([place]) 09/04/2018  . AKI (acute kidney injury) ([place]) 09/04/2018  . Cystocele, midline 10/10/2017  . Vaginal dryness 10/10/2017  . Screening for colorectal cancer 10/10/2017  . Encounter for well woman exam with routine gynecological exam 10/10/2017  . Abnormal CT scan, neck 06/14/2017  . Odynophagia 01/10/2017  . Neck pain on right side 01/10/2017  . Abdominal pain, epigastric 02/09/2016  . Colon cancer screening   . Mucosal  abnormality of stomach   . Dysphagia, pharyngoesophageal phase   . Esophageal dysphagia 10/15/2014  . Chronic fatigue 04/10/2014  . Encounter for screening colonoscopy 09/05/2013  . Chest wall pain 09/11/2012  . Hypothyroidism 11/18/2011  . Obesity, unspecified 11/18/2011  . GERD 12/05/2008  . Gastroparesis 04/03/2008  . CONSTIPATION, CHRONIC 04/03/2008    Kipp Brood, PT, DPT, [facility] Physical Therapist with [facility]  09/08/2018 5:25 PM    [place] [address] [place], [place], [address] Phone: [phone]   Fax:  [phone]  Name: LOANN CHAHAL MRN: [id number] Date of Birth: 02/11/1966

## 2018-09-12 ENCOUNTER — Encounter: Payer: Self-pay | Admitting: Internal Medicine

## 2018-09-12 ENCOUNTER — Other Ambulatory Visit: Payer: Self-pay

## 2018-09-12 ENCOUNTER — Ambulatory Visit (INDEPENDENT_AMBULATORY_CARE_PROVIDER_SITE_OTHER): Payer: No Typology Code available for payment source | Admitting: Internal Medicine

## 2018-09-12 DIAGNOSIS — K219 Gastro-esophageal reflux disease without esophagitis: Secondary | ICD-10-CM

## 2018-09-12 DIAGNOSIS — K3184 Gastroparesis: Secondary | ICD-10-CM

## 2018-09-12 NOTE — Progress Notes (Signed)
Primary Care Physician:  Raiford Simmonds., PA-C Primary Gastroenterologist:  Dr. Gala Romney  Pre-Procedure History & Physical: HPI:  [name] [name] is a 53 y.o. female here for follow-up insulin-dependent diabetes mellitus.  Gastroparesis.  GERD.  We took her off low-dose Reglan previously because she was really doing well continued on Protonix 40 mg twice daily.  She called in a few weeks ago stating nausea had returned.  Not adequately, controlled with Zofran alone. Continues on Protonix twice daily.  No bowel issues.  No real vomiting.  Hemoglobin A1c unknown.  Recent morning blood sugar at health department 88.  No abdominal pain or fever. I instructed her to go back on Reglan 2.5 mg before meals and at bedtime.  She has done that her nausea has subsided.  She does note from time to time she is having some burning/tingling of her face but no muscle movement issues.  Does not recall the symptoms previously on a higher dose of Reglan. Past Medical History:  Diagnosis Date  . Grave's disease    diagnosed years ago/ now hypothyroidism  . Hypertension   . IBS (irritable colon syndrome)    constipation predominant  . Nondiabetic gastroparesis     Past Surgical History:  Procedure Laterality Date  . ABDOMINAL HYSTERECTOMY    . attempted colonoscopy  03/2004   Dr. Tekeya Geffert--> prep was poor. Exam to 40 cm. No gross abnormalities. Internal hemorrhoids. Air contrast barium enema was normal.  . CHOLECYSTECTOMY    . COLONOSCOPY N/A 10/30/2014   RMR: normal  . ESOPHAGEAL DILATION N/A 10/30/2014   Procedure: ESOPHAGEAL DILATION;  Surgeon: Daneil Dolin, MD;  [facility];  Service: Endoscopy;  Laterality: N/A;  . ESOPHAGOGASTRODUODENOSCOPY  01/2004   Dr. Almyra Free small polyps in postbulbar area ablated  . ESOPHAGOGASTRODUODENOSCOPY N/A 10/30/2014   [id number]  . S/P Hysterectomy     . TONSILLECTOMY    . TUBAL LIGATION      Prior to Admission medications   Medication Sig Start Date  End Date Taking? Authorizing Provider  acetaminophen (TYLENOL) 500 MG tablet Take 1,000 mg by mouth every 6 (six) hours as needed for headache.   Yes [provider]  atorvastatin (LIPITOR) 20 MG tablet Take 20 mg by mouth daily.   Yes [provider]  blood glucose meter kit and supplies Use to check your blood sugar three times a day as directed. (FOR ICD-10 E10.9, E11.9). 09/07/18  Yes Barton Dubois, MD  cetirizine (ZYRTEC) 10 MG tablet Take 10 mg by mouth daily.   Yes [provider]  estradiol (ESTRACE) 1 MG tablet Take 1 tablet (1 mg total) by mouth daily. 03/27/18  Yes Estill Dooms, NP  Insulin Isophane & Regular Human (NOVOLIN 70/30 FLEXPEN RELION) (70-30) 100 UNIT/ML PEN Inject 25 Units into the skin 2 (two) times a day. 09/07/18  Yes Barton Dubois, MD  Insulin Pen Needle (RELION PEN NEEDLES) 32G X 4 MM MISC Use twice a day as instructed to inject insulin. 09/07/18  Yes Barton Dubois, MD  LANTUS SOLOSTAR 100 UNIT/ML Solostar Pen Inject 20 Units into the skin at bedtime. 09/01/18  Yes [provider]  levothyroxine (SYNTHROID) 88 MCG tablet Take 88 mcg by mouth daily. 08/05/18  Yes [provider]  losartan (COZAAR) 25 MG tablet Take 1 tablet (25 mg total) by mouth daily. 09/07/18 09/07/19 Yes Barton Dubois, MD  metFORMIN (GLUCOPHAGE) 500 MG tablet Take 1 tablet (500 mg total) by mouth 2 (two)  times daily with a meal. 09/07/18 09/07/19 Yes Barton Dubois, MD  metoCLOPramide (REGLAN) 5 MG tablet Take 0.5 tablets by mouth 4 (four) times daily -  before meals and at bedtime. 02/14/18  Yes [provider]  metoprolol tartrate (LOPRESSOR) 25 MG tablet Take 25 mg by mouth daily.    Yes [provider]  ondansetron (ZOFRAN ODT) 4 MG disintegrating tablet Take 1 tablet (4 mg total) by mouth every 8 (eight) hours as needed. 09/03/18  Yes Fredia Sorrow, MD  pantoprazole (PROTONIX) 40 MG tablet Take 1 tablet (40 mg total) by mouth 2 (two)  times daily. 09/07/18  Yes Barton Dubois, MD  Vitamin D, Ergocalciferol, (DRISDOL) 1.25 MG (50000 UT) CAPS capsule Take 50,000 Units by mouth once a week. 09/01/18  Yes [provider]    Allergies as of 09/12/2018 - Review Complete 09/12/2018  Allergen Reaction Noted  . Codeine Nausea And Vomiting     Family History  Problem Relation Age of Onset  . Other Father 53       hit by train  . Heart disease Mother   . Kidney disease Mother   . Colon cancer Neg Hx     Social History   Socioeconomic History  . Marital status: Single    Spouse name: Not on file  . Number of children: 1  . Years of education: Not on file  . Highest education level: Not on file  Occupational History  . Occupation: unemployed since July 3    Employer: [employer]  Social Needs  . Financial resource strain: Not on file  . Food insecurity:    Worry: Not on file    Inability: Not on file  . Transportation needs:    Medical: Not on file    Non-medical: Not on file  Tobacco Use  . Smoking status: Former Smoker    Types: E-cigarettes  . Smokeless tobacco: Never Used  Substance and Sexual Activity  . Alcohol use: Not Currently    Comment: occ  . Drug use: No  . Sexual activity: Yes    Birth control/protection: Surgical    Comment: hyst  Lifestyle  . Physical activity:    Days per week: Not on file    Minutes per session: Not on file  . Stress: Not on file  Relationships  . Social connections:    Talks on phone: Not on file    Gets together: Not on file    Attends religious service: Not on file    Active member of club or organization: Not on file    Attends meetings of clubs or organizations: Not on file    Relationship status: Not on file  . Intimate partner violence:    Fear of current or ex partner: Not on file    Emotionally abused: Not on file    Physically abused: Not on file    Forced sexual activity: Not on file  Other Topics Concern  . Not on file  Social  History Narrative   1 grown healthy daughter    Review of Systems: See HPI, otherwise negative ROS  Physical Exam: There were no vitals taken for this visit. General:   Alert,  Well-developed, well-nourished, pleasant and cooperative in NAD  Impression/Plan: Pleasant 53 year old lady with GERD and7 diabetic gastroparesis.  Symptoms quiescent  now back on low-dose Reglan continue on Protonix with Zofran as needed. Face "burning/tingling".  A bit strange.  Roughly temporally related to reinstitution of Reglan.  Otherwise, nothing  to sounds like dystonia or tardive dyskinesia.  We had a lengthy discussion about potential side effects of Reglan.  ``` Recommendations:  Decrease Reglan to 2.5 mg twice daily as needed for nausea; Zofran for breakthrough symptoms  Continue Protonix 40 mg twice daily  If tingling/burning symptoms persist she is to let me know  Continue on a gastroparesis diet  Continue to strive for good blood sugar control every day  Face-to-face office visit in 3 months  Call with any problems whatsoever between now and `next office visit.    Notice: This dictation was prepared with Dragon dictation along with smaller phrase technology. Any transcriptional errors that result from this process are unintentional and may not be corrected upon review.

## 2018-09-12 NOTE — Patient Instructions (Signed)
   Decrease Reglan to 2.5 mg twice daily as needed for nausea; Zofran for breakthrough symptoms  Continue Protonix 40 mg twice daily  If tingling/burning symptoms persist she is to let me know  Continue on a gastroparesis diet  Continue to strive for good blood sugar control every day  Face-to-face office visit in 3 months  Call with any problems whatsoever between now and `next office visit.

## 2018-09-14 ENCOUNTER — Emergency Department (HOSPITAL_COMMUNITY)
Admission: EM | Admit: 2018-09-14 | Discharge: 2018-09-14 | Disposition: A | Discharge status: Home / Self Care | Payer: No Typology Code available for payment source | Attending: Emergency Medicine | Admitting: Emergency Medicine

## 2018-09-14 ENCOUNTER — Encounter (HOSPITAL_COMMUNITY): Payer: Self-pay

## 2018-09-14 ENCOUNTER — Other Ambulatory Visit: Payer: Self-pay

## 2018-09-14 DIAGNOSIS — Z79899 Other long term (current) drug therapy: Secondary | ICD-10-CM | POA: Insufficient documentation

## 2018-09-14 DIAGNOSIS — E039 Hypothyroidism, unspecified: Secondary | ICD-10-CM | POA: Diagnosis not present

## 2018-09-14 DIAGNOSIS — I10 Essential (primary) hypertension: Secondary | ICD-10-CM | POA: Diagnosis not present

## 2018-09-14 DIAGNOSIS — Z87891 Personal history of nicotine dependence: Secondary | ICD-10-CM | POA: Insufficient documentation

## 2018-09-14 DIAGNOSIS — Z794 Long term (current) use of insulin: Secondary | ICD-10-CM | POA: Insufficient documentation

## 2018-09-14 DIAGNOSIS — E162 Hypoglycemia, unspecified: Secondary | ICD-10-CM | POA: Diagnosis not present

## 2018-09-14 LAB — CBG MONITORING, ED
Glucose-Capillary: 118 mg/dL — ABNORMAL HIGH (ref 70–99)
Glucose-Capillary: 124 mg/dL — ABNORMAL HIGH (ref 70–99)

## 2018-09-14 NOTE — ED Notes (Signed)
MD at bedside. Per request, pt provided with sandwich, cheese stick, and juice.

## 2018-09-14 NOTE — ED Notes (Signed)
Bed: WA17 Expected date:  Expected time:  Means of arrival:  Comments: EMS hypoglycemia

## 2018-09-14 NOTE — ED Triage Notes (Signed)
Pt BIBA from car in parking lot. Initial FSBG was 38. Pt had 1 mg with glucagon, and then 25 g D10. FSBG 105 with EMS.

## 2018-09-14 NOTE — ED Provider Notes (Signed)
[facility] DEPT Provider Note   CSN: [id number] Arrival date & time: 09/14/18  1510    History   Chief Complaint Chief Complaint  Patient presents with  . Hypoglycemia    HPI [name] [name] is a 53 y.o. female.     The history is provided by the patient.  Hypoglycemia  Initial blood sugar:  44 Blood sugar after intervention:  116 Severity:  Mild Onset quality:  Gradual Progression:  Resolved Chronicity:  New Diabetic status:  Controlled with insulin Context: decreased oral intake   Relieved by:  Oral glucose and glucagon Associated symptoms: no altered mental status, no decreased responsiveness, no dizziness, no seizures, no shortness of breath, no speech difficulty and no vomiting     Past Medical History:  Diagnosis Date  . Grave's disease    diagnosed years ago/ now hypothyroidism  . Hypertension   . IBS (irritable colon syndrome)    constipation predominant  . Nondiabetic gastroparesis     Patient Active Problem List   Diagnosis Date Noted  . Acute renal failure ([place])   . Benign essential HTN   . Dehydration with hypernatremia   . DKA (diabetic ketoacidoses) ([place]) 09/04/2018  . AKI (acute kidney injury) ([place]) 09/04/2018  . Cystocele, midline 10/10/2017  . Vaginal dryness 10/10/2017  . Screening for colorectal cancer 10/10/2017  . Encounter for well woman exam with routine gynecological exam 10/10/2017  . Abnormal CT scan, neck 06/14/2017  . Odynophagia 01/10/2017  . Neck pain on right side 01/10/2017  . Abdominal pain, epigastric 02/09/2016  . Colon cancer screening   . Mucosal abnormality of stomach   . Dysphagia, pharyngoesophageal phase   . Esophageal dysphagia 10/15/2014  . Chronic fatigue 04/10/2014  . Encounter for screening colonoscopy 09/05/2013  . Chest wall pain 09/11/2012  . Hypothyroidism 11/18/2011  . Obesity, unspecified 11/18/2011  . GERD 12/05/2008  . Gastroparesis 04/03/2008  . CONSTIPATION,  CHRONIC 04/03/2008    Past Surgical History:  Procedure Laterality Date  . ABDOMINAL HYSTERECTOMY    . attempted colonoscopy  03/2004   Dr. Rourk--> prep was poor. Exam to 40 cm. No gross abnormalities. Internal hemorrhoids. Air contrast barium enema was normal.  . CHOLECYSTECTOMY    . COLONOSCOPY N/A 10/30/2014   RMR: normal  . ESOPHAGEAL DILATION N/A 10/30/2014   Procedure: ESOPHAGEAL DILATION;  Surgeon: Daneil Dolin, MD;  Location: [facility];  Service: Endoscopy;  Laterality: N/A;  . ESOPHAGOGASTRODUODENOSCOPY  01/2004   Dr. Almyra Free small polyps in postbulbar area ablated  . ESOPHAGOGASTRODUODENOSCOPY N/A 10/30/2014   YIA:[id number]  . S/P Hysterectomy     . TONSILLECTOMY    . TUBAL LIGATION       OB History    Gravida  1   Para  1   Term  1   Preterm      AB      Living  1     SAB      TAB      Ectopic      Multiple      Live Births  1            Home Medications    Prior to Admission medications   Medication Sig Start Date End Date Taking? Authorizing Provider  acetaminophen (TYLENOL) 500 MG tablet Take 1,000 mg by mouth every 6 (six) hours as needed for headache.    [provider]  atorvastatin (LIPITOR) 20 MG tablet Take 20 mg by mouth daily.  [provider]  blood glucose meter kit and supplies Use to check your blood sugar three times a day as directed. (FOR ICD-10 E10.9, E11.9). 09/07/18   Barton Dubois, MD  cetirizine (ZYRTEC) 10 MG tablet Take 10 mg by mouth daily.    [provider]  estradiol (ESTRACE) 1 MG tablet Take 1 tablet (1 mg total) by mouth daily. 03/27/18   Estill Dooms, NP  Insulin Isophane & Regular Human (NOVOLIN 70/30 FLEXPEN RELION) (70-30) 100 UNIT/ML PEN Inject 25 Units into the skin 2 (two) times a day. 09/07/18   Barton Dubois, MD  Insulin Pen Needle (RELION PEN NEEDLES) 32G X 4 MM MISC Use twice a day as instructed to inject insulin. 09/07/18   Barton Dubois, MD  LANTUS SOLOSTAR 100  UNIT/ML Solostar Pen Inject 20 Units into the skin at bedtime. 09/01/18   [provider]  levothyroxine (SYNTHROID) 88 MCG tablet Take 88 mcg by mouth daily. 08/05/18   [provider]  losartan (COZAAR) 25 MG tablet Take 1 tablet (25 mg total) by mouth daily. 09/07/18 09/07/19  Barton Dubois, MD  metFORMIN (GLUCOPHAGE) 500 MG tablet Take 1 tablet (500 mg total) by mouth 2 (two) times daily with a meal. 09/07/18 09/07/19  Barton Dubois, MD  metoCLOPramide (REGLAN) 5 MG tablet Take 0.5 tablets by mouth 4 (four) times daily -  before meals and at bedtime. 02/14/18   [provider]  metoprolol tartrate (LOPRESSOR) 25 MG tablet Take 25 mg by mouth daily.     [provider]  ondansetron (ZOFRAN ODT) 4 MG disintegrating tablet Take 1 tablet (4 mg total) by mouth every 8 (eight) hours as needed. 09/03/18   Fredia Sorrow, MD  pantoprazole (PROTONIX) 40 MG tablet Take 1 tablet (40 mg total) by mouth 2 (two) times daily. 09/07/18   Barton Dubois, MD  Vitamin D, Ergocalciferol, (DRISDOL) 1.25 MG (50000 UT) CAPS capsule Take 50,000 Units by mouth once a week. 09/01/18   [provider]    Family History Family History  Problem Relation Age of Onset  . Other Father 37       hit by train  . Heart disease Mother   . Kidney disease Mother   . Colon cancer Neg Hx     Social History Social History   Tobacco Use  . Smoking status: Former Smoker    Types: E-cigarettes  . Smokeless tobacco: Never Used  Substance Use Topics  . Alcohol use: Not Currently    Comment: occ  . Drug use: No     Allergies   Codeine   Review of Systems Review of Systems  Constitutional: Negative for chills, decreased responsiveness and fever.  HENT: Negative for ear pain and sore throat.   Eyes: Negative for pain and visual disturbance.  Respiratory: Negative for cough and shortness of breath.   Cardiovascular: Negative for chest pain and palpitations.  Gastrointestinal:  Negative for abdominal pain and vomiting.  Genitourinary: Negative for dysuria and hematuria.  Musculoskeletal: Negative for arthralgias and back pain.  Skin: Negative for color change and rash.  Neurological: Negative for dizziness, seizures, syncope and speech difficulty.  All other systems reviewed and are negative.    Physical Exam Updated Vital Signs  ED Triage Vitals  Enc Vitals Group     BP 09/14/18 1524 (!) 151/66     Pulse Rate 09/14/18 1524 69     Resp 09/14/18 1524 16     Temp 09/14/18 1524 97.8 F (36.6  C)     Temp Source 09/14/18 1524 Oral     SpO2 09/14/18 1524 100 %     Weight 09/14/18 1523 174 lb 9.7 oz (79.2 kg)     Height 09/14/18 1523 '5\' 5"'$  (1.651 m)     Head Circumference --      Peak Flow --      Pain Score 09/14/18 1523 0     Pain Loc --      Pain Edu? --      Excl. in [place]? --     Physical Exam Vitals signs and nursing note reviewed.  Constitutional:      General: She is not in acute distress.    Appearance: She is well-developed.  HENT:     Head: Normocephalic and atraumatic.     Mouth/Throat:     Mouth: Mucous membranes are moist.  Eyes:     Extraocular Movements: Extraocular movements intact.     Conjunctiva/sclera: Conjunctivae normal.     Pupils: Pupils are equal, round, and reactive to light.  Neck:     Musculoskeletal: Normal range of motion and neck supple.  Cardiovascular:     Rate and Rhythm: Normal rate and regular rhythm.     Pulses: Normal pulses.     Heart sounds: Normal heart sounds. No murmur.  Pulmonary:     Effort: Pulmonary effort is normal. No respiratory distress.     Breath sounds: Normal breath sounds.  Abdominal:     Palpations: Abdomen is soft.     Tenderness: There is no abdominal tenderness.  Skin:    General: Skin is warm and dry.     Capillary Refill: Capillary refill takes less than 2 seconds.  Neurological:     General: No focal deficit present.     Mental Status: She is alert.      ED Treatments /  Results  Labs (all labs ordered are listed, but only abnormal results are displayed) Labs Reviewed  CBG MONITORING, ED - Abnormal; Notable for the following components:      Result Value   Glucose-Capillary 118 (*)    All other components within normal limits  CBG MONITORING, ED - Abnormal; Notable for the following components:   Glucose-Capillary 124 (*)    All other components within normal limits    EKG None  Radiology No results found.  Procedures Procedures (including critical care time)  Medications Ordered in ED Medications - No data to display   Initial Impression / Assessment and Plan / ED Course  I have reviewed the triage vital signs and the nursing notes.  Pertinent labs & imaging results that were available during my care of the patient were reviewed by me and considered in my medical decision making (see chart for details).     [name] [name] is a 53 year old female history of diabetes newly on insulin who had hypoglycemic event today.  Patient with normal vitals.  No fever.  Patient was lethargic and found by EMS to have a blood sugar of 40.  She got glucagon and oral glucose and blood sugar has improved.  Patient asymptomatic upon my evaluation.  She states she just already take insulin this month.  She took her short acting insulin this morning and did not have much to eat after that.  Had run out of her Accu-Cheks and was on the way to get them at the store but ended up having change in mental status while in the car with her husband.  Patient feels better after oral glucose and glucagon.  She was observed in the ED and was able to eat and drink without any issues.  Repeat blood sugar was stable.  Given further education about using insulin and discharged from ED in good condition.  Given return precautions.  This chart was dictated using voice recognition software.  Despite best efforts to proofread,  errors can occur which can change the documentation meaning.     Final Clinical Impressions(s) / ED Diagnoses   Final diagnoses:  Hypoglycemia    ED Discharge Orders    None       Lennice Sites, DO 09/14/18 2254

## 2018-09-22 ENCOUNTER — Encounter: Payer: Self-pay | Admitting: *Deleted

## 2018-09-25 ENCOUNTER — Ambulatory Visit: Payer: Self-pay | Admitting: Adult Health

## 2018-10-03 ENCOUNTER — Telehealth: Payer: Self-pay | Admitting: Nutrition

## 2018-10-03 NOTE — Telephone Encounter (Signed)
VM left to call to schedule appt. No answer.

## 2018-10-13 ENCOUNTER — Other Ambulatory Visit: Payer: Self-pay | Admitting: Internal Medicine

## 2018-10-19 ENCOUNTER — Telehealth: Payer: Self-pay | Admitting: Nutrition

## 2018-10-19 ENCOUNTER — Ambulatory Visit: Payer: No Typology Code available for payment source | Admitting: Nutrition

## 2018-10-19 NOTE — Telephone Encounter (Signed)
No answer. Vm left to call to complete DM /Obesity visit over the phone.

## 2018-10-24 ENCOUNTER — Telehealth: Payer: Self-pay | Admitting: Nutrition

## 2018-10-24 NOTE — Telephone Encounter (Signed)
Returned call to schedule appt. VM left.

## 2018-10-24 NOTE — Telephone Encounter (Signed)
Briefly went over plate method until her visit. Recommend she contact PCP for referral to Dr. Dorris Fetch, Endocrinology for her thyroid and Dm. She verbalized understanding.

## 2018-11-08 NOTE — Telephone Encounter (Signed)
vm left to call back.

## 2018-11-09 ENCOUNTER — Telehealth: Payer: Self-pay | Admitting: Nutrition

## 2018-11-09 ENCOUNTER — Ambulatory Visit: Payer: No Typology Code available for payment source | Admitting: Nutrition

## 2018-11-09 NOTE — Telephone Encounter (Signed)
vm left to call to complete appointment.

## 2018-11-09 NOTE — Telephone Encounter (Signed)
vm left to call and reschedule missed appt.

## 2018-11-22 ENCOUNTER — Other Ambulatory Visit (HOSPITAL_COMMUNITY)
Admission: RE | Admit: 2018-11-22 | Discharge: 2018-11-22 | Disposition: A | Discharge status: Home / Self Care | Payer: Self-pay | Source: Ambulatory Visit | Attending: Family Medicine | Admitting: Family Medicine

## 2018-11-22 DIAGNOSIS — Z0000 Encounter for general adult medical examination without abnormal findings: Secondary | ICD-10-CM | POA: Insufficient documentation

## 2018-11-22 LAB — HEMOGLOBIN A1C
Hgb A1c MFr Bld: 6.9 % — ABNORMAL HIGH (ref 4.8–5.6)
Mean Plasma Glucose: 151.33 mg/dL

## 2018-11-28 ENCOUNTER — Other Ambulatory Visit: Payer: Self-pay | Admitting: Internal Medicine

## 2019-05-28 ENCOUNTER — Other Ambulatory Visit: Payer: Self-pay | Admitting: Nurse Practitioner

## 2019-05-29 ENCOUNTER — Other Ambulatory Visit: Payer: Self-pay | Admitting: Gastroenterology

## 2019-05-29 DIAGNOSIS — R110 Nausea: Secondary | ICD-10-CM

## 2019-05-29 DIAGNOSIS — K3184 Gastroparesis: Secondary | ICD-10-CM

## 2019-05-29 MED ORDER — ONDANSETRON 4 MG PO TBDP: 4.0000 mg | ORAL_TABLET | Freq: Three times a day (TID) | ORAL | 1 refills | Status: DC | PRN

## 2019-05-30 ENCOUNTER — Telehealth: Payer: Self-pay | Admitting: Internal Medicine

## 2019-05-30 NOTE — Telephone Encounter (Signed)
Lmom, waiting on a return call.  

## 2019-05-30 NOTE — Telephone Encounter (Signed)
Pt said she had a message in Mychart to call the office. Please call [phone]

## 2019-05-31 NOTE — Telephone Encounter (Signed)
Left a detailed message for pt. Pt sent a mychart message and [place] sent pt in Zofran for the nausea. Pt was advised to call the office if she is still having problems.

## 2019-06-06 ENCOUNTER — Telehealth: Payer: Self-pay | Admitting: Internal Medicine

## 2019-06-06 ENCOUNTER — Telehealth: Payer: Self-pay | Admitting: Gastroenterology

## 2019-06-06 NOTE — Telephone Encounter (Signed)
Reviewed patient message regarding request for Zofran refill. I have never seen this patient. She has been following with Dr. Gala Romney most recently with last office visit in May 2020. She does have gastroparesis and was advised at her last visit to continue Protonix 40 mg twice daily, Reglan 2.5 mg twice daily, and zofran as needed. She should also be following a strict gastroparesis diet including low fiber and 4-6 small meals daily. Has she been following these recommendations? We can mail her a gastroparesis diet.  She is also a diabetic. Not sure how her blood sugar is running at home, but she should keep a close eye on it as elevated glucose can cause nausea as well as slow her stomach emptying even more.   Patient needs an office visit with RMR. Looks like he has an opening on 06/12/19 at 3pm.

## 2019-06-06 NOTE — Telephone Encounter (Signed)
Left a message on machine. Waiting on a return call from pt. I sent pt a message yesterday through my chart. Pt had a refill on Zofran and was going to have it filled yesterday.

## 2019-06-06 NOTE — Telephone Encounter (Signed)
Pt returning call. [phone]

## 2019-06-07 NOTE — Telephone Encounter (Signed)
Spoke with pt. She was given recommendations per [facility]. Pt scheduled a f/u for 07/2019 with RMR. Pt started a new job and her insurance starts then. Pt is going to follow the gastroparesis diet, info mailed to pt, continue the Pantoprazole, Regan, Zofran prn, eat 4-6 smaller meals daily. Pt is going to keep an eye out on her blood sugar daily, since it can cause nausea as well.

## 2019-06-07 NOTE — Telephone Encounter (Signed)
Pt returned call. Lmom, waiting on a return call.

## 2019-06-13 ENCOUNTER — Other Ambulatory Visit: Payer: Self-pay | Admitting: Nurse Practitioner

## 2019-06-13 ENCOUNTER — Other Ambulatory Visit: Payer: Self-pay | Admitting: Gastroenterology

## 2019-06-13 DIAGNOSIS — K3184 Gastroparesis: Secondary | ICD-10-CM

## 2019-06-15 IMAGING — CT CT NECK W/ CM
3 of 4 series · 14 of 33 positions shown, 17 images · IV contrast (iopamidol)
Comparison: Thyroid ultrasound 12/21/2016 next healed

CLINICAL DATA: Difficulty swallowing for 1 year.

EXAM:
CT NECK WITH CONTRAST
TECHNIQUE: Multidetector CT imaging of the neck was performed using the
standard protocol following the bolus administration of intravenous
contrast.
CONTRAST:  75mL [id number] IOPAMIDOL ([id number]) INJECTION 61%

[Series 6: coronal neck · coronal · 0.40mm/px · 3 of 101 slices shown]
[im 21/101  bone]
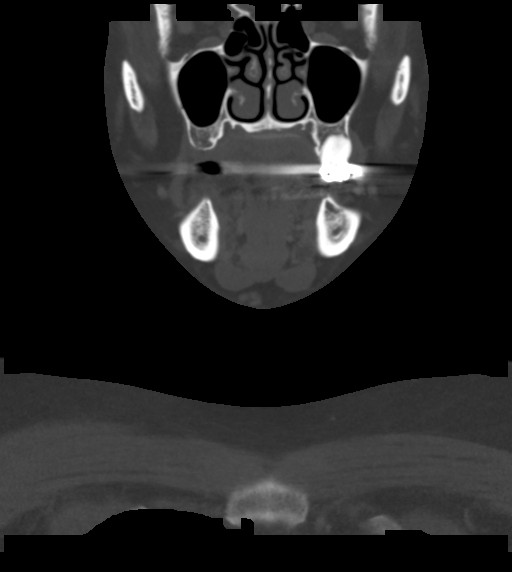
[im 41/101  bone]
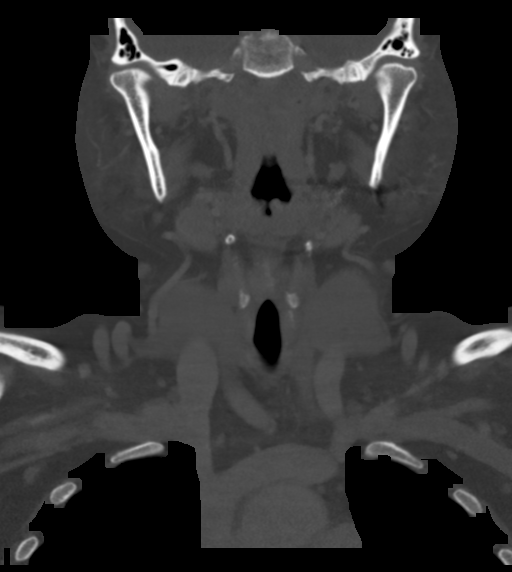
[im 61/101  bone]
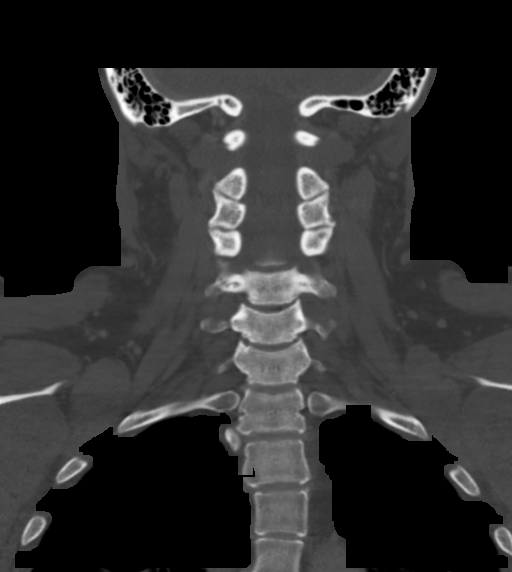

[Series 7: sagittal neck · sagittal · 0.43mm/px · 5 of 101 slices shown, 6 images]
[im 34/101  bone]
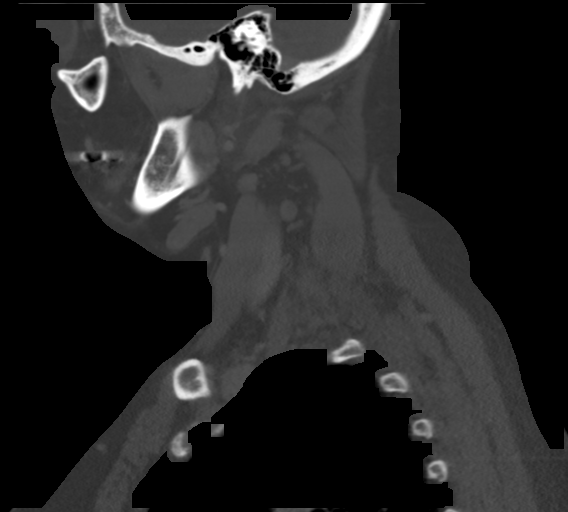
[im 42/101  bone]
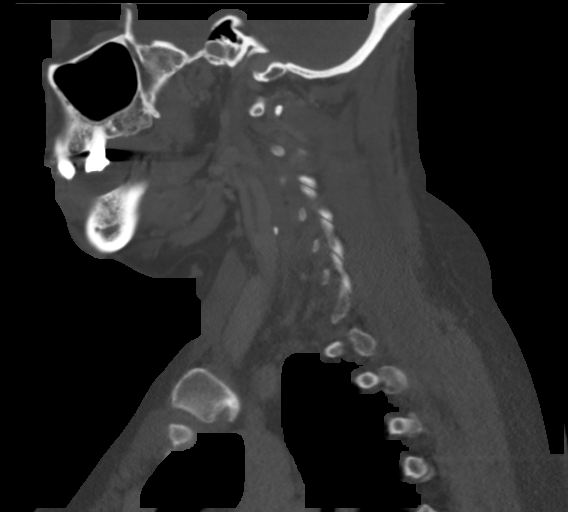
[im 51/101  soft-tissue]
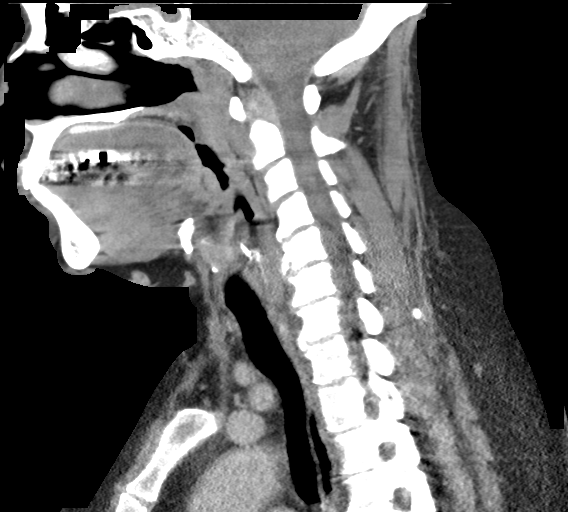
[im 51/101  bone]
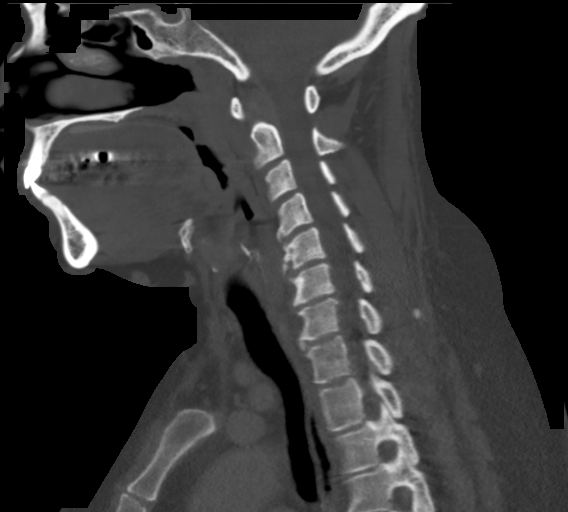
[im 59/101  bone]
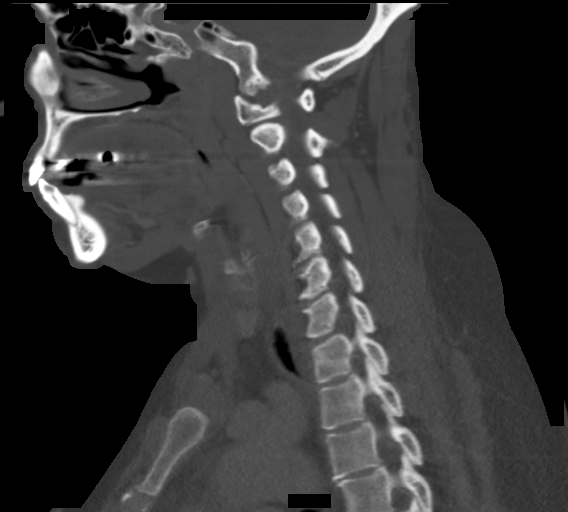
[im 67/101  bone]
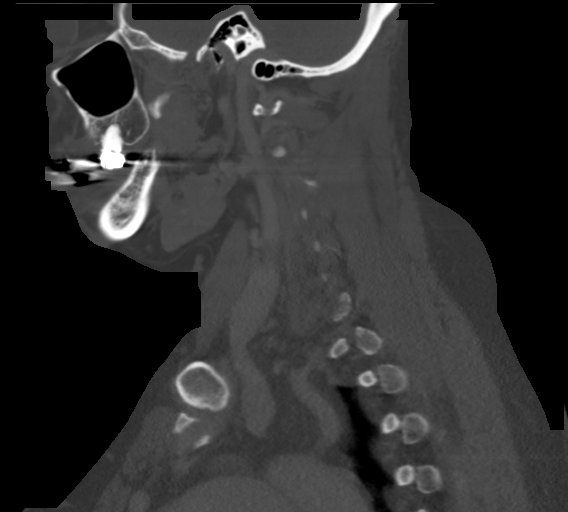

[Series 8: orthogonal ax · axial · 0.39mm/px · z∈[+38,+215]mm · 6 of 127 slices shown, 8 images]
[im 19/127  soft-tissue]
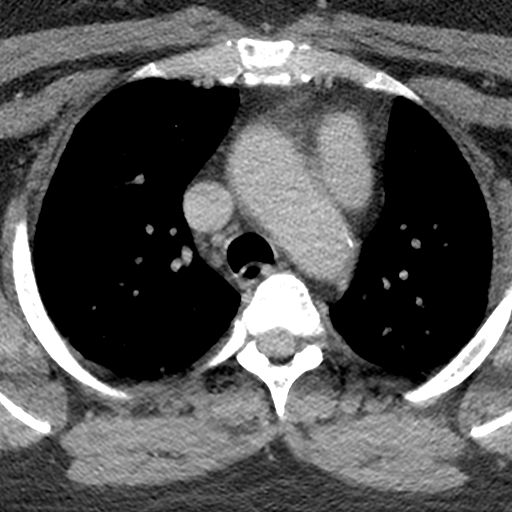
[im 19/127  bone]
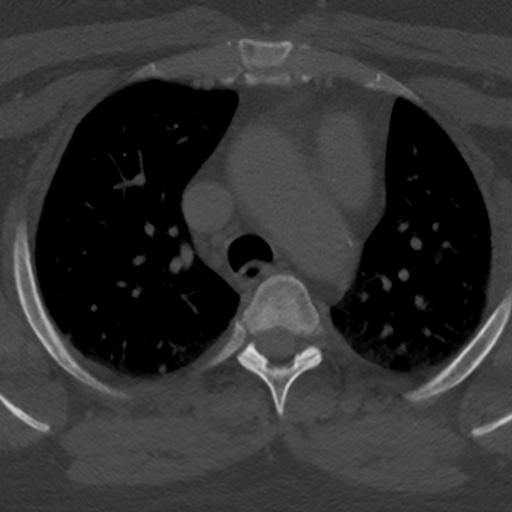
[im 37/127  bone]
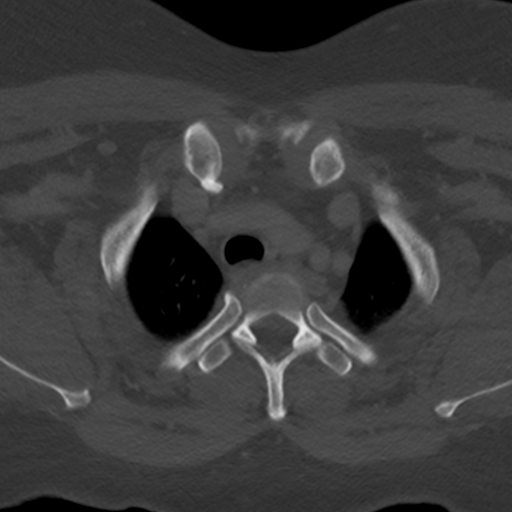
[im 55/127  bone]
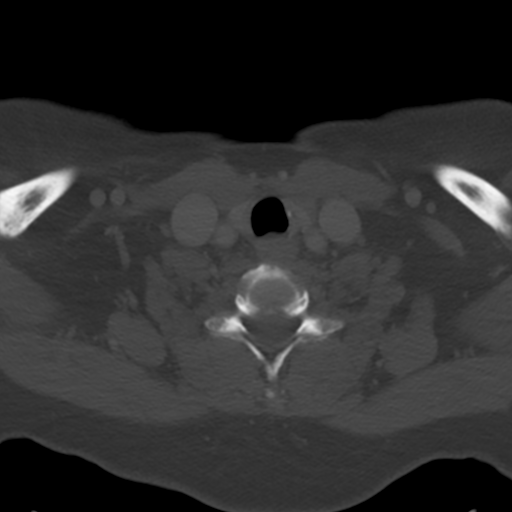
[im 73/127  bone]
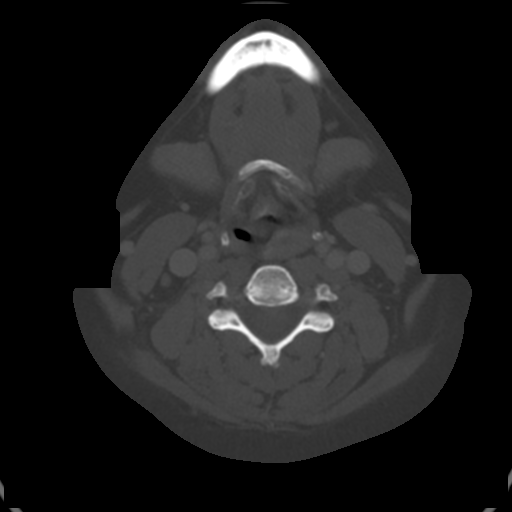
[im 91/127  soft-tissue]
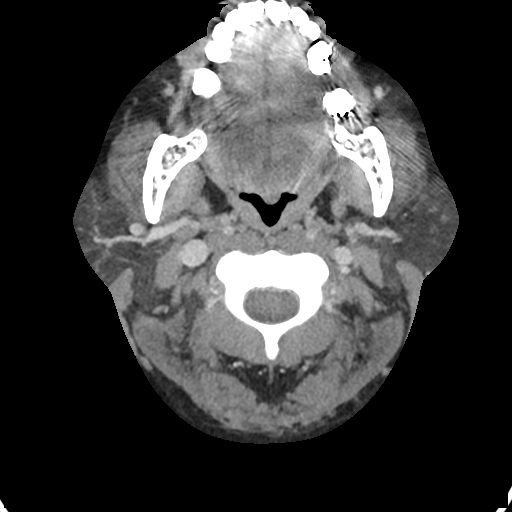
[im 91/127  bone]
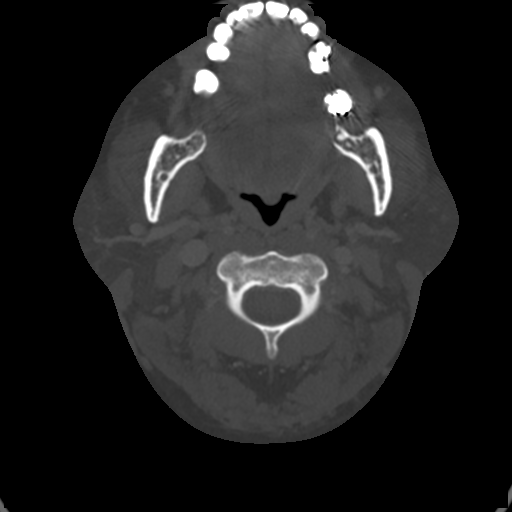
[im 109/127  bone]
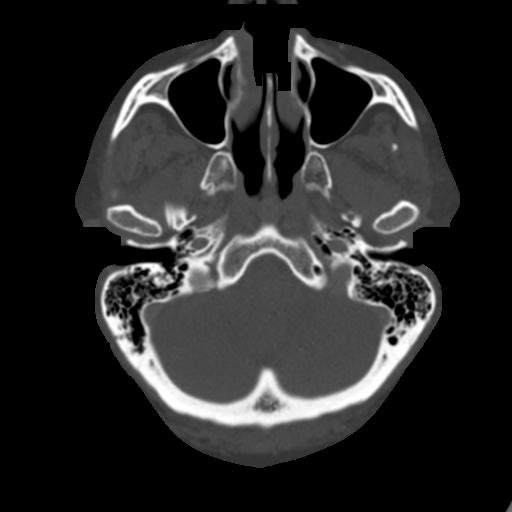

[14 of 33 positions shown; findings below may reference images not displayed]

FINDINGS: Pharynx and larynx: Asymmetric soft tissue is present along the
posterolateral aspect of the supraglottic hypopharynx. There is mild
prominence of the palatine tonsils and adenoid tissue without a
discrete lesion otherwise. The epiglottis is within normal limits.
The vallecula is clear. Lingual tonsils are within normal limits.
Parapharyngeal fat is normal. Vocal cords are midline and symmetric.
The trachea is unremarkable.

Salivary glands: The submandibular and parotid glands are normal
bilaterally. No significant ductal lesion is present.

Thyroid: Atrophic thyroid is again demonstrated.

Lymph nodes: No significant cervical adenopathy is present.

Vascular: Atherosclerotic changes are present in the distal common
carotid arteries and at the bifurcations bilaterally without a
significant stenosis relative to the more distal vessels.

Limited intracranial: Within normal limits.

Visualized orbits: Unremarkable.

Mastoids and visualized paranasal sinuses: The visualized paranasal
sinuses and mastoid air cells are clear.

Skeleton: Mild endplate changes are present in the mid cervical
spine. Large anterior osteophytes are present on the right at C5-6.
No focal lytic or blastic lesions are present. The mandible is
intact and located.

Upper chest: Lung apices are clear. The superior mediastinum is
within normal limits.
IMPRESSION: 1. Mildly prominent in asymmetric soft tissue in the posterior
hypopharynx on the left. Recommend direct inspection by ENT.
2. Mild prominence of palatine tonsils and adenoid tissue with other
without other discrete lesion.
3. No significant adenopathy.
4. Mild degenerative changes in the cervical spine.
5. Atherosclerosis without significant focal stenosis.

## 2019-06-27 ENCOUNTER — Other Ambulatory Visit (HOSPITAL_COMMUNITY): Payer: Self-pay | Admitting: Nurse Practitioner

## 2019-06-27 DIAGNOSIS — Z1231 Encounter for screening mammogram for malignant neoplasm of breast: Secondary | ICD-10-CM

## 2019-07-02 ENCOUNTER — Other Ambulatory Visit: Payer: Self-pay

## 2019-07-02 ENCOUNTER — Ambulatory Visit (HOSPITAL_COMMUNITY)
Admission: RE | Admit: 2019-07-02 | Discharge: 2019-07-02 | Disposition: A | Discharge status: Home / Self Care | Payer: Self-pay | Source: Ambulatory Visit | Attending: Nurse Practitioner | Admitting: Nurse Practitioner

## 2019-07-02 DIAGNOSIS — Z1231 Encounter for screening mammogram for malignant neoplasm of breast: Secondary | ICD-10-CM | POA: Insufficient documentation

## 2019-07-03 ENCOUNTER — Ambulatory Visit: Payer: Self-pay | Admitting: Internal Medicine

## 2019-07-03 ENCOUNTER — Ambulatory Visit: Payer: No Typology Code available for payment source | Admitting: Internal Medicine

## 2019-07-03 ENCOUNTER — Encounter: Payer: Self-pay | Admitting: Internal Medicine

## 2019-07-03 VITALS — BP 144/79 | HR 72 | Temp 96.7°F | Ht 61.0 in | Wt 202.2 lb

## 2019-07-03 DIAGNOSIS — K219 Gastro-esophageal reflux disease without esophagitis: Secondary | ICD-10-CM

## 2019-07-03 DIAGNOSIS — K3184 Gastroparesis: Secondary | ICD-10-CM

## 2019-07-03 DIAGNOSIS — R110 Nausea: Secondary | ICD-10-CM

## 2019-07-03 NOTE — Progress Notes (Signed)
Primary Care Physician:  Lemmie Evens, MD Primary Gastroenterologist:  Dr. Gala Romney  Pre-Procedure History & Physical: HPI:  [name] [name] is a 54 y.o. female here for follow-up of GERD and gastroparesis.  Early morning nausea more of a problem takes Reglan 2.5 mg in the morning occasionally takes Zofran.  Reflux symptoms well controlled on 40 mg of Protonix twice daily.  No actual vomiting no melena no hematochezia no constipation or diarrhea.  Blood sugar control has improved per patient report but I do not really have any numbers in the record for the past 6 or 7 months however.  She will be due for average her screening colonoscopy 2026.  Previously had some facial tingling.  May or may not been related to Reglan.  She is continue Reglan a lower dose.  Having no symptoms whatsoever now on no symptoms consistent with tardive dyskinesia or dystonia.  Past Medical History:  Diagnosis Date  . Grave's disease    diagnosed years ago/ now hypothyroidism  . Hypertension   . IBS (irritable colon syndrome)    constipation predominant  . Nondiabetic gastroparesis     Past Surgical History:  Procedure Laterality Date  . ABDOMINAL HYSTERECTOMY    . attempted colonoscopy  03/2004   Dr. Marshawn Normoyle--> prep was poor. Exam to 40 cm. No gross abnormalities. Internal hemorrhoids. Air contrast barium enema was normal.  . CHOLECYSTECTOMY    . COLONOSCOPY N/A 10/30/2014   RMR: normal  . ESOPHAGEAL DILATION N/A 10/30/2014   Procedure: ESOPHAGEAL DILATION;  Surgeon: Daneil Dolin, MD;  Location: [facility];  Service: Endoscopy;  Laterality: N/A;  . ESOPHAGOGASTRODUODENOSCOPY  01/2004   Dr. Almyra Free small polyps in postbulbar area ablated  . ESOPHAGOGASTRODUODENOSCOPY N/A 10/30/2014   FTD:[id number]  . S/P Hysterectomy     . TONSILLECTOMY    . TUBAL LIGATION      Prior to Admission medications   Medication Sig Start Date End Date Taking? Authorizing Provider  acetaminophen (TYLENOL) 500 MG tablet  Take 1,000 mg by mouth every 6 (six) hours as needed for headache.   Yes [provider]  atorvastatin (LIPITOR) 20 MG tablet Take 20 mg by mouth daily.   Yes [provider]  blood glucose meter kit and supplies Use to check your blood sugar three times a day as directed. (FOR ICD-10 E10.9, E11.9). 09/07/18  Yes Barton Dubois, MD  cetirizine (ZYRTEC) 10 MG tablet Take 10 mg by mouth daily.   Yes [provider]  estradiol (ESTRACE) 1 MG tablet Take 1 tablet (1 mg total) by mouth daily. 03/27/18  Yes Estill Dooms, NP  Insulin Isophane & Regular Human (NOVOLIN 70/30 FLEXPEN RELION) (70-30) 100 UNIT/ML PEN Inject 25 Units into the skin 2 (two) times a day. Patient taking differently: Inject 25 Units into the skin as needed. Only takes if blood sugar is over 150. 09/07/18  Yes Barton Dubois, MD  Insulin Pen Needle (RELION PEN NEEDLES) 32G X 4 MM MISC Use twice a day as instructed to inject insulin. 09/07/18  Yes Barton Dubois, MD  LANTUS SOLOSTAR 100 UNIT/ML Solostar Pen Inject 20 Units into the skin at bedtime. 09/01/18  Yes [provider]  levothyroxine (SYNTHROID) 88 MCG tablet Take 88 mcg by mouth daily. 08/05/18  Yes [provider]  losartan (COZAAR) 25 MG tablet Take 1 tablet (25 mg total) by mouth daily. 09/07/18 09/07/19 Yes Barton Dubois, MD  metoCLOPramide (REGLAN) 5 MG tablet TAKE 1/2 (ONE-HALF) TABLET BY  MOUTH TWICE DAILY AS DIRECTED 05/28/19  Yes Aliene Altes S, PA-C  metoprolol tartrate (LOPRESSOR) 25 MG tablet Take 25 mg by mouth daily.    Yes [provider]  ondansetron (ZOFRAN-ODT) 4 MG disintegrating tablet DISSOLVE 1 TABLET IN MOUTH EVERY 8 HOURS AS NEEDED 06/13/19  Yes Mahala Menghini, PA-C  pantoprazole (PROTONIX) 40 MG tablet Take 1 tablet (40 mg total) by mouth daily before breakfast. 06/13/19  Yes Mahala Menghini, PA-C    Allergies as of 07/03/2019 - Review Complete 07/03/2019  Allergen Reaction Noted  . Codeine Nausea  And Vomiting     Family History  Problem Relation Age of Onset  . Other Father 58       hit by train  . Heart disease Mother   . Kidney disease Mother   . Colon cancer Neg Hx     Social History   Socioeconomic History  . Marital status: Single    Spouse name: Not on file  . Number of children: 1  . Years of education: Not on file  . Highest education level: Not on file  Occupational History  . Occupation: unemployed since July 3    Employer: [employer]  Tobacco Use  . Smoking status: Former Smoker    Types: E-cigarettes  . Smokeless tobacco: Never Used  Substance and Sexual Activity  . Alcohol use: Not Currently    Comment: occ  . Drug use: No  . Sexual activity: Yes    Birth control/protection: Surgical    Comment: hyst  Other Topics Concern  . Not on file  Social History Narrative   1 grown healthy daughter   Social Determinants of Health   Financial Resource Strain:   . Difficulty of Paying Living Expenses: Not on file  Food Insecurity:   . Worried About Charity fundraiser in the Last Year: Not on file  . Ran Out of Food in the Last Year: Not on file  Transportation Needs:   . Lack of Transportation (Medical): Not on file  . Lack of Transportation (Non-Medical): Not on file  Physical Activity:   . Days of Exercise per Week: Not on file  . Minutes of Exercise per Session: Not on file  Stress:   . Feeling of Stress : Not on file  Social Connections:   . Frequency of Communication with Friends and Family: Not on file  . Frequency of Social Gatherings with Friends and Family: Not on file  . Attends Religious Services: Not on file  . Active Member of Clubs or Organizations: Not on file  . Attends Archivist Meetings: Not on file  . Marital Status: Not on file  Intimate Partner Violence:   . Fear of Current or Ex-Partner: Not on file  . Emotionally Abused: Not on file  . Physically Abused: Not on file  . Sexually Abused: Not on file     Review of Systems: See HPI, otherwise negative ROS  Physical Exam: BP (!) 144/79   Pulse 72   Temp (!) 96.7 F (35.9 C) (Temporal)   Ht '5\' 1"'$  (1.549 m)   Wt 202 lb 3.2 oz (91.7 kg)   BMI 38.21 kg/m  General:   Alert,  Well-developed, well-nourished, pleasant and cooperative in NAD Mouth:  No deformity or lesions. Neck:  Supple; no masses or thyromegaly. No significant cervical adenopathy. Lungs:  Clear throughout to auscultation.   No wheezes, crackles, or rhonchi. No acute distress. Heart:  Regular rate and rhythm;  no murmurs, clicks, rubs,  or gallops. Abdomen: Non-distended, normal bowel sounds.  Soft and nontender without appreciable mass or hepatosplenomegaly. No succussion splash. Pulses:  Normal pulses noted. Extremities:  Without clubbing or edema.  Impression/Plan: Pleasant 54 year old lady with gastroparesis likely related to diabetes continues to have some early morning nausea.  No alarm symptoms.  On a very low, nearly homeopathic, dose of Reglan without any apparent side effects.  Improved blood sugars over the past year with improved A1c's.  GERD component well-controlled on twice daily Protonix.  Due for screening colonoscopy 2026.   Recommendations:  Take one Zofran 4 mg tablet and Reglan 2.5 mg tablet as soon as you get up each morning (call with any Reglan sided effects - discussed with patient)  Gastroparesis diet information provided  Continue Protonix 40 mg twice daily  OV 3 months    Notice: This dictation was prepared with Dragon dictation along with smaller phrase technology. Any transcriptional errors that result from this process are unintentional and may not be corrected upon review.

## 2019-07-03 NOTE — Patient Instructions (Signed)
Take one Zofran 4 mg tablet and Reglan 2.5 mg tablet as soon as you get up each morning (call with any Reglan sided effects - discussed with patient)  Gastroparesis diet information provided  Continue Protonix 40 mg twice daily  OV 3 months

## 2019-07-29 ENCOUNTER — Other Ambulatory Visit: Payer: Self-pay | Admitting: Gastroenterology

## 2019-07-29 DIAGNOSIS — K3184 Gastroparesis: Secondary | ICD-10-CM

## 2019-08-07 ENCOUNTER — Ambulatory Visit: Payer: PRIVATE HEALTH INSURANCE | Admitting: Internal Medicine

## 2019-08-13 DIAGNOSIS — R600 Localized edema: Secondary | ICD-10-CM | POA: Diagnosis not present

## 2019-08-13 DIAGNOSIS — E039 Hypothyroidism, unspecified: Secondary | ICD-10-CM | POA: Diagnosis not present

## 2019-08-13 DIAGNOSIS — E1165 Type 2 diabetes mellitus with hyperglycemia: Secondary | ICD-10-CM | POA: Diagnosis not present

## 2019-08-13 DIAGNOSIS — E6609 Other obesity due to excess calories: Secondary | ICD-10-CM | POA: Diagnosis not present

## 2019-08-16 ENCOUNTER — Ambulatory Visit: Payer: BC Managed Care – PPO | Admitting: Podiatry

## 2019-08-17 ENCOUNTER — Other Ambulatory Visit: Payer: Self-pay

## 2019-08-17 ENCOUNTER — Other Ambulatory Visit (HOSPITAL_COMMUNITY)
Admission: RE | Admit: 2019-08-17 | Discharge: 2019-08-17 | Disposition: A | Discharge status: Home / Self Care | Payer: BC Managed Care – PPO | Source: Ambulatory Visit | Attending: Family Medicine | Admitting: Family Medicine

## 2019-08-17 DIAGNOSIS — E7800 Pure hypercholesterolemia, unspecified: Secondary | ICD-10-CM | POA: Insufficient documentation

## 2019-08-17 DIAGNOSIS — E782 Mixed hyperlipidemia: Secondary | ICD-10-CM | POA: Insufficient documentation

## 2019-08-17 LAB — CBC WITH DIFFERENTIAL/PLATELET
Abs Immature Granulocytes: 0.01 10*3/uL (ref 0.00–0.07)
Basophils Absolute: 0 10*3/uL (ref 0.0–0.1)
Basophils Relative: 1 %
Eosinophils Absolute: 0.4 10*3/uL (ref 0.0–0.5)
Eosinophils Relative: 7 %
HCT: 38.4 % (ref 36.0–46.0)
Hemoglobin: 11.5 g/dL — ABNORMAL LOW (ref 12.0–15.0)
Immature Granulocytes: 0 %
Lymphocytes Relative: 40 %
Lymphs Abs: 2.3 10*3/uL (ref 0.7–4.0)
MCH: 28.2 pg (ref 26.0–34.0)
MCHC: 29.9 g/dL — ABNORMAL LOW (ref 30.0–36.0)
MCV: 94.1 fL (ref 80.0–100.0)
Monocytes Absolute: 0.4 10*3/uL (ref 0.1–1.0)
Monocytes Relative: 7 %
Neutro Abs: 2.6 10*3/uL (ref 1.7–7.7)
Neutrophils Relative %: 45 %
Platelets: 310 10*3/uL (ref 150–400)
RBC: 4.08 MIL/uL (ref 3.87–5.11)
RDW: 13.3 % (ref 11.5–15.5)
WBC: 5.8 10*3/uL (ref 4.0–10.5)
nRBC: 0 % (ref 0.0–0.2)

## 2019-08-17 LAB — COMPREHENSIVE METABOLIC PANEL
ALT: 24 U/L (ref 0–44)
AST: 23 U/L (ref 15–41)
Albumin: 4.1 g/dL (ref 3.5–5.0)
Alkaline Phosphatase: 75 U/L (ref 38–126)
Anion gap: 8 (ref 5–15)
BUN: 20 mg/dL (ref 6–20)
CO2: 26 mmol/L (ref 22–32)
Calcium: 8.9 mg/dL (ref 8.9–10.3)
Chloride: 104 mmol/L (ref 98–111)
Creatinine, Ser: 1.91 mg/dL — ABNORMAL HIGH (ref 0.44–1.00)
GFR calc Af Amer: 34 mL/min — ABNORMAL LOW (ref >60)
GFR calc non Af Amer: 29 mL/min — ABNORMAL LOW (ref >60)
Glucose, Bld: 112 mg/dL — ABNORMAL HIGH (ref 70–99)
Potassium: 4.4 mmol/L (ref 3.5–5.1)
Sodium: 138 mmol/L (ref 135–145)
Total Bilirubin: 0.8 mg/dL (ref 0.3–1.2)
Total Protein: 8 g/dL (ref 6.5–8.1)

## 2019-08-17 LAB — LIPID PANEL
Cholesterol: 155 mg/dL (ref 0–200)
HDL: 41 mg/dL (ref >40)
LDL Cholesterol: 98 mg/dL (ref 0–99)
Total CHOL/HDL Ratio: 3.8 RATIO
Triglycerides: 80 mg/dL (ref <150)
VLDL: 16 mg/dL (ref 0–40)

## 2019-08-17 LAB — TSH: TSH: 21.795 u[IU]/mL — ABNORMAL HIGH (ref 0.350–4.500)

## 2019-08-23 ENCOUNTER — Ambulatory Visit: Payer: BC Managed Care – PPO | Admitting: Podiatry

## 2019-08-23 DIAGNOSIS — H5203 Hypermetropia, bilateral: Secondary | ICD-10-CM | POA: Diagnosis not present

## 2019-08-23 DIAGNOSIS — H2513 Age-related nuclear cataract, bilateral: Secondary | ICD-10-CM | POA: Diagnosis not present

## 2019-08-23 DIAGNOSIS — H524 Presbyopia: Secondary | ICD-10-CM | POA: Diagnosis not present

## 2019-08-23 DIAGNOSIS — E1136 Type 2 diabetes mellitus with diabetic cataract: Secondary | ICD-10-CM | POA: Diagnosis not present

## 2019-08-28 ENCOUNTER — Ambulatory Visit: Payer: BC Managed Care – PPO

## 2019-08-28 ENCOUNTER — Other Ambulatory Visit: Payer: Self-pay

## 2019-08-28 ENCOUNTER — Telehealth: Payer: Self-pay | Admitting: *Deleted

## 2019-08-28 ENCOUNTER — Ambulatory Visit: Payer: BC Managed Care – PPO | Admitting: Podiatry

## 2019-08-28 ENCOUNTER — Encounter: Payer: Self-pay | Admitting: Podiatry

## 2019-08-28 VITALS — BP 125/65 | HR 74 | Resp 16

## 2019-08-28 DIAGNOSIS — D492 Neoplasm of unspecified behavior of bone, soft tissue, and skin: Secondary | ICD-10-CM | POA: Diagnosis not present

## 2019-08-28 NOTE — Telephone Encounter (Signed)
-----   Message from Rip Harbour, [facility] sent at 08/28/2019  1:33 PM EDT ----- Regarding: MRI MRI ankle right with contrast - evaluate soft tissue mass medial heel right - surgical consideration    Order Request Summary    Order ID: [id number]  Request Status: Authorized   Health Plan: [employer]    Valid Dates: 08/28/2019 - 02/23/2020  Scheduled Date of Service: 08/28/2019   Member Information: [place] , [place]

## 2019-08-28 NOTE — Progress Notes (Signed)
Subjective:  Patient ID: [name] [name], female    DOB: 1965/08/23,  MRN: [id number] HPI Chief Complaint  Patient presents with  . Foot Pain    Medial heel right - lump x 1-2 years, worse now she is working again, getting larger, saw 2 docs-one rx'd itch cream and the other said was a cyst  . Diabetes    Last a1c was 7.0  . New Patient (Initial Visit)    54 y.o. female presents with the above complaint.   ROS: Denies fever chills nausea vomiting muscle aches pains calf pain back pain chest pain shortness of breath.  Past Medical History:  Diagnosis Date  . Grave's disease    diagnosed years ago/ now hypothyroidism  . Hypertension   . IBS (irritable colon syndrome)    constipation predominant  . Nondiabetic gastroparesis    Past Surgical History:  Procedure Laterality Date  . ABDOMINAL HYSTERECTOMY    . attempted colonoscopy  03/2004   Dr. Rourk--> prep was poor. Exam to 40 cm. No gross abnormalities. Internal hemorrhoids. Air contrast barium enema was normal.  . CHOLECYSTECTOMY    . COLONOSCOPY N/A 10/30/2014   RMR: normal  . ESOPHAGEAL DILATION N/A 10/30/2014   Procedure: ESOPHAGEAL DILATION;  Surgeon: Daneil Dolin, MD;  Location: [facility];  Service: Endoscopy;  Laterality: N/A;  . ESOPHAGOGASTRODUODENOSCOPY  01/2004   Dr. Almyra Free small polyps in postbulbar area ablated  . ESOPHAGOGASTRODUODENOSCOPY N/A 10/30/2014   ALP:[id number]  . S/P Hysterectomy     . TONSILLECTOMY    . TUBAL LIGATION      Current Outpatient Medications:  .  acetaminophen (TYLENOL) 500 MG tablet, Take 1,000 mg by mouth every 6 (six) hours as needed for headache., Disp: , Rfl:  .  atorvastatin (LIPITOR) 20 MG tablet, Take 20 mg by mouth daily., Disp: , Rfl:  .  blood glucose meter kit and supplies, Use to check your blood sugar three times a day as directed. (FOR ICD-10 E10.9, E11.9)., Disp: 1 each, Rfl: 0 .  cetirizine (ZYRTEC) 10 MG tablet, Take 10 mg by mouth daily., Disp: , Rfl:  .   estradiol (ESTRACE) 1 MG tablet, Take 1 tablet (1 mg total) by mouth daily., Disp: 30 tablet, Rfl: 12 .  furosemide (LASIX) 20 MG tablet, Take 20 mg by mouth daily as needed., Disp: , Rfl:  .  Insulin Isophane & Regular Human (NOVOLIN 70/30 FLEXPEN RELION) (70-30) 100 UNIT/ML PEN, Inject 25 Units into the skin 2 (two) times a day. (Patient taking differently: Inject 25 Units into the skin as needed. Only takes if blood sugar is over 150.), Disp: 15 mL, Rfl: 11 .  Insulin Pen Needle (RELION PEN NEEDLES) 32G X 4 MM MISC, Use twice a day as instructed to inject insulin., Disp: 200 each, Rfl: 3 .  ketorolac (ACULAR) 0.5 % ophthalmic solution, SMARTSIG:1 Drop(s) In Eye(s) 5 Times Daily, Disp: , Rfl:  .  LANTUS SOLOSTAR 100 UNIT/ML Solostar Pen, Inject 20 Units into the skin at bedtime., Disp: , Rfl:  .  levothyroxine (SYNTHROID) 88 MCG tablet, Take 88 mcg by mouth daily., Disp: , Rfl:  .  losartan (COZAAR) 25 MG tablet, Take 1 tablet (25 mg total) by mouth daily., Disp: 30 tablet, Rfl: 3 .  metoCLOPramide (REGLAN) 5 MG tablet, TAKE 1/2 (ONE-HALF) TABLET BY MOUTH TWICE DAILY AS DIRECTED, Disp: 30 tablet, Rfl: 3 .  metoprolol succinate (TOPROL-XL) 25 MG 24 hr tablet, Take 25 mg by mouth daily., Disp: ,  Rfl:  .  metoprolol tartrate (LOPRESSOR) 25 MG tablet, Take 25 mg by mouth daily. , Disp: , Rfl:  .  ofloxacin (OCUFLOX) 0.3 % ophthalmic solution, INSTILL 1 DROP THREE TIMES DAILY IN OPERATIVE EYE(S) STARTING 2 DAYS PRIOR TO SURGERY AND AFTER SURGERY FOR 3 WEEKS, Disp: , Rfl:  .  ondansetron (ZOFRAN-ODT) 4 MG disintegrating tablet, DISSOLVE 1 TABLET IN MOUTH EVERY 8 HOURS AS NEEDED, Disp: 30 tablet, Rfl: 3 .  pantoprazole (PROTONIX) 40 MG tablet, Take 1 tablet (40 mg total) by mouth daily before breakfast., Disp: 30 tablet, Rfl: 11 .  prednisoLONE acetate (PRED FORTE) 1 % ophthalmic suspension, INSTILL 1 DROP THREE TIMES DAILY IN OPERATIVE EYE STARTING 2 DAYS BEFORE SURGERY AND CONTINUING AFTER SURGERY FOR 3  WEEKS, Disp: , Rfl:   Allergies  Allergen Reactions  . Codeine Nausea And Vomiting   Review of Systems Objective:   Vitals:   08/28/19 1324  BP: 125/65  Pulse: 74  Resp: 16    General: Well developed, nourished, in no acute distress, alert and oriented x3   Dermatological: Skin is warm, dry and supple bilateral. Nails x 10 are well maintained; remaining integument appears unremarkable at this time. There are no open sores, no preulcerative lesions, no rash or signs of infection present.  Firm soft tissue mass measuring 3 cm in diameter just medial to the plantar surface of the heel posteriorly.  It is firm nontender with palpation but painful on ambulation.  Vascular: Dorsalis Pedis artery and Posterior Tibial artery pedal pulses are 2/4 bilateral with immedate capillary fill time. Pedal hair growth present. No varicosities and no lower extremity edema present bilateral.   Neruologic: Grossly intact via light touch bilateral. Vibratory intact via tuning fork bilateral. Protective threshold with Semmes Wienstein monofilament intact to all pedal sites bilateral. Patellar and Achilles deep tendon reflexes 2+ bilateral. No Babinski or clonus noted bilateral.   Musculoskeletal: No gross boney pedal deformities bilateral. No pain, crepitus, or limitation noted with foot and ankle range of motion bilateral. Muscular strength 5/5 in all groups tested bilateral.  Gait: Unassisted, Nonantalgic.    Radiographs:  None taken  Assessment & Plan:   Assessment: Large nonpulsatile mass posterior medial aspect of the left heel near the plantar surface.  Plan: Soft tissue tumor requesting MRI for excision evaluation for primary tumors.     Blaiden Werth T. [place], [place]

## 2019-08-28 NOTE — Telephone Encounter (Signed)
Faxed orders with authorization to [facility].

## 2019-09-11 ENCOUNTER — Other Ambulatory Visit: Payer: Self-pay

## 2019-09-11 ENCOUNTER — Other Ambulatory Visit (HOSPITAL_COMMUNITY)
Admission: RE | Admit: 2019-09-11 | Discharge: 2019-09-11 | Disposition: A | Discharge status: Home / Self Care | Payer: BC Managed Care – PPO | Source: Ambulatory Visit | Attending: Nurse Practitioner | Admitting: Nurse Practitioner

## 2019-09-11 DIAGNOSIS — E039 Hypothyroidism, unspecified: Secondary | ICD-10-CM | POA: Insufficient documentation

## 2019-09-11 DIAGNOSIS — E119 Type 2 diabetes mellitus without complications: Secondary | ICD-10-CM | POA: Diagnosis not present

## 2019-09-11 DIAGNOSIS — E6609 Other obesity due to excess calories: Secondary | ICD-10-CM | POA: Diagnosis not present

## 2019-09-11 DIAGNOSIS — R600 Localized edema: Secondary | ICD-10-CM | POA: Insufficient documentation

## 2019-09-11 DIAGNOSIS — I10 Essential (primary) hypertension: Secondary | ICD-10-CM | POA: Diagnosis not present

## 2019-09-11 DIAGNOSIS — R799 Abnormal finding of blood chemistry, unspecified: Secondary | ICD-10-CM | POA: Diagnosis not present

## 2019-09-11 LAB — RETICULOCYTES
Immature Retic Fract: 11.9 % (ref 2.3–15.9)
RBC.: 4.26 MIL/uL (ref 3.87–5.11)
Retic Count, Absolute: 60.1 10*3/uL (ref 19.0–186.0)
Retic Ct Pct: 1.4 % (ref 0.4–3.1)

## 2019-09-11 LAB — IRON AND TIBC
Iron: 72 ug/dL (ref 28–170)
Saturation Ratios: 22 % (ref 10.4–31.8)
TIBC: 325 ug/dL (ref 250–450)
UIBC: 253 ug/dL

## 2019-09-11 LAB — FOLATE: Folate: 12.4 ng/mL (ref >5.9)

## 2019-09-11 LAB — VITAMIN B12: Vitamin B-12: 378 pg/mL (ref 180–914)

## 2019-09-11 LAB — FERRITIN: Ferritin: 118 ng/mL (ref 11–307)

## 2019-09-14 DIAGNOSIS — D649 Anemia, unspecified: Secondary | ICD-10-CM | POA: Diagnosis not present

## 2019-09-14 DIAGNOSIS — L509 Urticaria, unspecified: Secondary | ICD-10-CM | POA: Diagnosis not present

## 2019-09-28 ENCOUNTER — Ambulatory Visit
Admission: RE | Admit: 2019-09-28 | Discharge: 2019-09-28 | Disposition: A | Discharge status: Home / Self Care | Payer: BC Managed Care – PPO | Source: Ambulatory Visit | Attending: Podiatry | Admitting: Podiatry

## 2019-09-28 DIAGNOSIS — R2241 Localized swelling, mass and lump, right lower limb: Secondary | ICD-10-CM | POA: Diagnosis not present

## 2019-09-28 DIAGNOSIS — D492 Neoplasm of unspecified behavior of bone, soft tissue, and skin: Secondary | ICD-10-CM

## 2019-09-28 MED ORDER — GADOBENATE DIMEGLUMINE 529 MG/ML IV SOLN
20.0000 mL | Freq: Once | INTRAVENOUS | Status: AC | PRN
  Administered 2019-09-28: 20 mL via INTRAVENOUS

## 2019-10-01 DIAGNOSIS — L509 Urticaria, unspecified: Secondary | ICD-10-CM | POA: Diagnosis not present

## 2019-10-01 DIAGNOSIS — E539 Vitamin B deficiency, unspecified: Secondary | ICD-10-CM | POA: Diagnosis not present

## 2019-10-02 ENCOUNTER — Telehealth: Payer: Self-pay | Admitting: *Deleted

## 2019-10-02 NOTE — Telephone Encounter (Signed)
-----   Message from Garrel Ridgel, [place] sent at 10/01/2019 11:58 AM EDT ----- Please send for over read and inform of the delay.  Currently no significant findings.

## 2019-10-02 NOTE — Telephone Encounter (Signed)
Left message informing pt of Dr. Stephenie Acres request to send copy of MRI disc to a radiology specialist for more details to plan treatment, there would be a 10-14 day delay for final results and once received we would call with instructions. Faxed request for MRI disc to [facility].

## 2019-10-03 ENCOUNTER — Ambulatory Visit: Payer: Self-pay | Admitting: Gastroenterology

## 2019-10-03 DIAGNOSIS — H2512 Age-related nuclear cataract, left eye: Secondary | ICD-10-CM | POA: Diagnosis not present

## 2019-10-03 DIAGNOSIS — H25811 Combined forms of age-related cataract, right eye: Secondary | ICD-10-CM | POA: Diagnosis not present

## 2019-10-03 DIAGNOSIS — H25012 Cortical age-related cataract, left eye: Secondary | ICD-10-CM | POA: Diagnosis not present

## 2019-10-08 NOTE — Telephone Encounter (Signed)
Received copy of MRI disc from [facility], mailed to [facility].

## 2019-10-17 DIAGNOSIS — H2511 Age-related nuclear cataract, right eye: Secondary | ICD-10-CM | POA: Diagnosis not present

## 2019-10-17 DIAGNOSIS — H25011 Cortical age-related cataract, right eye: Secondary | ICD-10-CM | POA: Diagnosis not present

## 2019-10-25 ENCOUNTER — Telehealth: Payer: Self-pay | Admitting: Podiatry

## 2019-10-25 NOTE — Telephone Encounter (Signed)
LVM  10/25/19 to sched for MRI results

## 2019-10-30 ENCOUNTER — Encounter: Payer: Self-pay | Admitting: Podiatry

## 2019-10-30 ENCOUNTER — Other Ambulatory Visit: Payer: Self-pay

## 2019-10-30 ENCOUNTER — Ambulatory Visit: Payer: BC Managed Care – PPO | Admitting: Podiatry

## 2019-10-30 DIAGNOSIS — M722 Plantar fascial fibromatosis: Secondary | ICD-10-CM

## 2019-10-30 MED ORDER — MELOXICAM 15 MG PO TABS
15.0000 mg | ORAL_TABLET | Freq: Every day | ORAL | 3 refills | Status: DC
Start: 2019-10-30 — End: 2020-04-07

## 2019-10-30 NOTE — Progress Notes (Signed)
She presents today for MRI results regarding soft tissue mass medial aspect of the right heel.  She states that both my feet hurt particularly in the heels.  Objective: Vital signs are stable she is alert oriented x3 there is no erythema edema cellulitis drainage or odor she has pain palpation medial calcaneal tubercle of the bilateral heels right greater than left.  Pathology report/MRI rates as subcutaneous fat noted tumorous mass.  Assessment: Plantar fasciitis most likely.  Plan: At this point I injected the bilateral heels 20 mg Kenalog 5 mg Marcaine start her on a fascial brace and meloxicam.  Discussed appropriate shoe gear stretching exercise ice therapy shoe gear modifications I will follow-up with her in 1 month

## 2019-11-02 ENCOUNTER — Telehealth: Payer: Self-pay | Admitting: Podiatry

## 2019-11-02 ENCOUNTER — Encounter: Payer: Self-pay | Admitting: Podiatry

## 2019-11-02 NOTE — Telephone Encounter (Signed)
Pt called and stated that she had cortisone shots 10/30/19 and wanted to know if its takes a while to kick in or is her pain normal please assist

## 2019-11-10 ENCOUNTER — Encounter: Payer: Self-pay | Admitting: Podiatry

## 2019-11-10 ENCOUNTER — Ambulatory Visit: Payer: BC Managed Care – PPO | Admitting: Podiatry

## 2019-11-10 ENCOUNTER — Other Ambulatory Visit: Payer: Self-pay

## 2019-11-10 DIAGNOSIS — M722 Plantar fascial fibromatosis: Secondary | ICD-10-CM

## 2019-11-10 MED ORDER — CELECOXIB 100 MG PO CAPS
100.0000 mg | ORAL_CAPSULE | Freq: Two times a day (BID) | ORAL | 3 refills | Status: DC
Start: 2019-11-10 — End: 2020-04-07

## 2019-11-11 NOTE — Progress Notes (Signed)
She presents today for follow-up of her plantar fasciitis states that they are both hurting so bad from standing at work 12-hour shifts.  She states that the meloxicam does not seem to be helping at all.  Objective: Vital signs are stable she is alert and oriented x3 exam performed today demonstrates only pain on palpation medial calcaneal tubercles otherwise the feet appear to be relatively normal with only mild pes planus.  Pes planus is flexible in nature.  Assessment: Chronic intractable plantar fasciitis with mild pes planus bilateral.  Plan: At this point I reinjected with Celestone and Kenalog mix to the bilateral heels we changed her nonsteroidal over to Celebrex 100 mg twice daily and she will follow up with Liliane Channel for orthotics.  I also recommended that she get a note for work to help with her standing all day hopefully we can at least do some kind of light duty or maybe even be out of work for a while to let these calm down.  She do not have to check with her work for that.  She will see Liliane Channel for custom-made orthotics.

## 2019-11-20 ENCOUNTER — Other Ambulatory Visit: Payer: Self-pay

## 2019-11-20 ENCOUNTER — Ambulatory Visit (INDEPENDENT_AMBULATORY_CARE_PROVIDER_SITE_OTHER): Payer: BC Managed Care – PPO | Admitting: Orthotics

## 2019-11-20 DIAGNOSIS — M722 Plantar fascial fibromatosis: Secondary | ICD-10-CM

## 2019-11-20 NOTE — Progress Notes (Signed)

## 2019-11-27 ENCOUNTER — Ambulatory Visit: Payer: Self-pay | Admitting: Gastroenterology

## 2019-12-07 ENCOUNTER — Telehealth: Payer: Self-pay | Admitting: *Deleted

## 2019-12-07 NOTE — Telephone Encounter (Signed)
Pt states her feet still hurt, the pills don't help.

## 2019-12-07 NOTE — Telephone Encounter (Signed)
Left message informing pt that she should make an appt, that any treatment for inflammation was built on previous treatments, that she should add ice therapy to her day, ice 3-4 times daily for 15-20 minutes/session protecting the skin from the ice with a light cloth.

## 2019-12-14 ENCOUNTER — Telehealth: Payer: Self-pay | Admitting: Podiatry

## 2019-12-14 NOTE — Telephone Encounter (Signed)
Pt left message @ 205pm stating she got orthotics yesterday and her feet are hurting worser.  I returned call and left message that pt should gradually break them in over a week or so. 1 hr at first and then increasing it daily by 1 hr. Her feet are not used to the support and it does take time for them to adjust. I asked pt to try this for a week or so and if still having pain to call and we can get her in to see Liliane Channel to see if they need to be adjusted.

## 2019-12-26 DIAGNOSIS — E1165 Type 2 diabetes mellitus with hyperglycemia: Secondary | ICD-10-CM | POA: Diagnosis not present

## 2019-12-26 DIAGNOSIS — I10 Essential (primary) hypertension: Secondary | ICD-10-CM | POA: Diagnosis not present

## 2019-12-26 DIAGNOSIS — E039 Hypothyroidism, unspecified: Secondary | ICD-10-CM | POA: Diagnosis not present

## 2019-12-26 DIAGNOSIS — M722 Plantar fascial fibromatosis: Secondary | ICD-10-CM | POA: Diagnosis not present

## 2020-01-03 ENCOUNTER — Other Ambulatory Visit: Payer: Self-pay | Admitting: Gastroenterology

## 2020-01-03 DIAGNOSIS — K3184 Gastroparesis: Secondary | ICD-10-CM

## 2020-01-04 ENCOUNTER — Other Ambulatory Visit (HOSPITAL_COMMUNITY)
Admission: RE | Admit: 2020-01-04 | Discharge: 2020-01-04 | Disposition: A | Discharge status: Home / Self Care | Payer: BC Managed Care – PPO | Source: Ambulatory Visit | Attending: Nurse Practitioner | Admitting: Nurse Practitioner

## 2020-01-04 ENCOUNTER — Other Ambulatory Visit: Payer: Self-pay

## 2020-01-04 DIAGNOSIS — I10 Essential (primary) hypertension: Secondary | ICD-10-CM | POA: Insufficient documentation

## 2020-01-04 DIAGNOSIS — E1165 Type 2 diabetes mellitus with hyperglycemia: Secondary | ICD-10-CM | POA: Diagnosis not present

## 2020-01-04 DIAGNOSIS — E039 Hypothyroidism, unspecified: Secondary | ICD-10-CM | POA: Diagnosis not present

## 2020-01-04 LAB — CBC
HCT: 38.9 % (ref 36.0–46.0)
Hemoglobin: 11.4 g/dL — ABNORMAL LOW (ref 12.0–15.0)
MCH: 28.1 pg (ref 26.0–34.0)
MCHC: 29.3 g/dL — ABNORMAL LOW (ref 30.0–36.0)
MCV: 95.8 fL (ref 80.0–100.0)
Platelets: 322 10*3/uL (ref 150–400)
RBC: 4.06 MIL/uL (ref 3.87–5.11)
RDW: 14.2 % (ref 11.5–15.5)
WBC: 6.7 10*3/uL (ref 4.0–10.5)
nRBC: 0 % (ref 0.0–0.2)

## 2020-01-04 LAB — COMPREHENSIVE METABOLIC PANEL
ALT: 74 U/L — ABNORMAL HIGH (ref 0–44)
AST: 53 U/L — ABNORMAL HIGH (ref 15–41)
Albumin: 4 g/dL (ref 3.5–5.0)
Alkaline Phosphatase: 83 U/L (ref 38–126)
Anion gap: 11 (ref 5–15)
BUN: 22 mg/dL — ABNORMAL HIGH (ref 6–20)
CO2: 25 mmol/L (ref 22–32)
Calcium: 9.5 mg/dL (ref 8.9–10.3)
Chloride: 105 mmol/L (ref 98–111)
Creatinine, Ser: 2.02 mg/dL — ABNORMAL HIGH (ref 0.44–1.00)
GFR calc Af Amer: 31 mL/min — ABNORMAL LOW (ref >60)
GFR calc non Af Amer: 27 mL/min — ABNORMAL LOW (ref >60)
Glucose, Bld: 128 mg/dL — ABNORMAL HIGH (ref 70–99)
Potassium: 4.6 mmol/L (ref 3.5–5.1)
Sodium: 141 mmol/L (ref 135–145)
Total Bilirubin: 1.1 mg/dL (ref 0.3–1.2)
Total Protein: 8 g/dL (ref 6.5–8.1)

## 2020-01-04 LAB — LIPID PANEL
Cholesterol: 171 mg/dL (ref 0–200)
HDL: 49 mg/dL (ref >40)
LDL Cholesterol: 112 mg/dL — ABNORMAL HIGH (ref 0–99)
Total CHOL/HDL Ratio: 3.5 RATIO
Triglycerides: 51 mg/dL (ref <150)
VLDL: 10 mg/dL (ref 0–40)

## 2020-01-04 LAB — TSH: TSH: 45.401 u[IU]/mL — ABNORMAL HIGH (ref 0.350–4.500)

## 2020-01-04 LAB — HEMOGLOBIN A1C
Hgb A1c MFr Bld: 7 % — ABNORMAL HIGH (ref 4.8–5.6)
Mean Plasma Glucose: 154.2 mg/dL

## 2020-01-24 DIAGNOSIS — M21612 Bunion of left foot: Secondary | ICD-10-CM | POA: Diagnosis not present

## 2020-01-24 DIAGNOSIS — M21611 Bunion of right foot: Secondary | ICD-10-CM | POA: Diagnosis not present

## 2020-01-24 DIAGNOSIS — M79671 Pain in right foot: Secondary | ICD-10-CM | POA: Diagnosis not present

## 2020-01-24 DIAGNOSIS — M79672 Pain in left foot: Secondary | ICD-10-CM | POA: Diagnosis not present

## 2020-01-24 DIAGNOSIS — M71572 Other bursitis, not elsewhere classified, left ankle and foot: Secondary | ICD-10-CM | POA: Diagnosis not present

## 2020-01-24 DIAGNOSIS — M71571 Other bursitis, not elsewhere classified, right ankle and foot: Secondary | ICD-10-CM | POA: Diagnosis not present

## 2020-01-24 DIAGNOSIS — M722 Plantar fascial fibromatosis: Secondary | ICD-10-CM | POA: Diagnosis not present

## 2020-01-29 ENCOUNTER — Other Ambulatory Visit (HOSPITAL_COMMUNITY)
Admission: RE | Admit: 2020-01-29 | Discharge: 2020-01-29 | Disposition: A | Discharge status: Home / Self Care | Payer: BC Managed Care – PPO | Source: Ambulatory Visit | Attending: Nurse Practitioner | Admitting: Nurse Practitioner

## 2020-01-29 DIAGNOSIS — R945 Abnormal results of liver function studies: Secondary | ICD-10-CM | POA: Insufficient documentation

## 2020-01-29 DIAGNOSIS — R799 Abnormal finding of blood chemistry, unspecified: Secondary | ICD-10-CM | POA: Insufficient documentation

## 2020-01-30 LAB — HEPATITIS PANEL, ACUTE
HCV Ab: NONREACTIVE
Hep A IgM: NONREACTIVE
Hep B C IgM: NONREACTIVE
Hepatitis B Surface Ag: NONREACTIVE

## 2020-01-31 ENCOUNTER — Ambulatory Visit: Payer: Self-pay | Admitting: Gastroenterology

## 2020-02-01 ENCOUNTER — Encounter: Payer: Self-pay | Admitting: Gastroenterology

## 2020-02-01 ENCOUNTER — Other Ambulatory Visit: Payer: Self-pay

## 2020-02-01 ENCOUNTER — Ambulatory Visit (INDEPENDENT_AMBULATORY_CARE_PROVIDER_SITE_OTHER): Payer: BC Managed Care – PPO | Admitting: Gastroenterology

## 2020-02-01 ENCOUNTER — Other Ambulatory Visit (HOSPITAL_COMMUNITY)
Admission: RE | Admit: 2020-02-01 | Discharge: 2020-02-01 | Disposition: A | Discharge status: Home / Self Care | Payer: BC Managed Care – PPO | Source: Ambulatory Visit | Attending: Gastroenterology | Admitting: Gastroenterology

## 2020-02-01 VITALS — BP 137/80 | HR 73 | Temp 97.0°F | Ht 61.0 in | Wt 205.8 lb

## 2020-02-01 DIAGNOSIS — K3184 Gastroparesis: Secondary | ICD-10-CM | POA: Insufficient documentation

## 2020-02-01 DIAGNOSIS — D649 Anemia, unspecified: Secondary | ICD-10-CM | POA: Diagnosis not present

## 2020-02-01 DIAGNOSIS — R945 Abnormal results of liver function studies: Secondary | ICD-10-CM | POA: Insufficient documentation

## 2020-02-01 DIAGNOSIS — K219 Gastro-esophageal reflux disease without esophagitis: Secondary | ICD-10-CM | POA: Diagnosis not present

## 2020-02-01 DIAGNOSIS — E039 Hypothyroidism, unspecified: Secondary | ICD-10-CM

## 2020-02-01 DIAGNOSIS — R7989 Other specified abnormal findings of blood chemistry: Secondary | ICD-10-CM | POA: Insufficient documentation

## 2020-02-01 DIAGNOSIS — D219 Benign neoplasm of connective and other soft tissue, unspecified: Secondary | ICD-10-CM | POA: Diagnosis not present

## 2020-02-01 LAB — CBC WITH DIFFERENTIAL/PLATELET
Abs Immature Granulocytes: 0.02 10*3/uL (ref 0.00–0.07)
Basophils Absolute: 0 10*3/uL (ref 0.0–0.1)
Basophils Relative: 0 %
Eosinophils Absolute: 0.4 10*3/uL (ref 0.0–0.5)
Eosinophils Relative: 5 %
HCT: 39.6 % (ref 36.0–46.0)
Hemoglobin: 11.9 g/dL — ABNORMAL LOW (ref 12.0–15.0)
Immature Granulocytes: 0 %
Lymphocytes Relative: 30 %
Lymphs Abs: 2.4 10*3/uL (ref 0.7–4.0)
MCH: 28.5 pg (ref 26.0–34.0)
MCHC: 30.1 g/dL (ref 30.0–36.0)
MCV: 95 fL (ref 80.0–100.0)
Monocytes Absolute: 0.5 10*3/uL (ref 0.1–1.0)
Monocytes Relative: 7 %
Neutro Abs: 4.8 10*3/uL (ref 1.7–7.7)
Neutrophils Relative %: 58 %
Platelets: 354 10*3/uL (ref 150–400)
RBC: 4.17 MIL/uL (ref 3.87–5.11)
RDW: 14.5 % (ref 11.5–15.5)
WBC: 8.2 10*3/uL (ref 4.0–10.5)
nRBC: 0 % (ref 0.0–0.2)

## 2020-02-01 LAB — COMPREHENSIVE METABOLIC PANEL
ALT: 138 U/L — ABNORMAL HIGH (ref 0–44)
AST: 63 U/L — ABNORMAL HIGH (ref 15–41)
Albumin: 4 g/dL (ref 3.5–5.0)
Alkaline Phosphatase: 147 U/L — ABNORMAL HIGH (ref 38–126)
Anion gap: 7 (ref 5–15)
BUN: 19 mg/dL (ref 6–20)
CO2: 27 mmol/L (ref 22–32)
Calcium: 9.3 mg/dL (ref 8.9–10.3)
Chloride: 103 mmol/L (ref 98–111)
Creatinine, Ser: 1.79 mg/dL — ABNORMAL HIGH (ref 0.44–1.00)
GFR calc non Af Amer: 31 mL/min — ABNORMAL LOW (ref >60)
Glucose, Bld: 135 mg/dL — ABNORMAL HIGH (ref 70–99)
Potassium: 4.8 mmol/L (ref 3.5–5.1)
Sodium: 137 mmol/L (ref 135–145)
Total Bilirubin: 1.1 mg/dL (ref 0.3–1.2)
Total Protein: 8.3 g/dL — ABNORMAL HIGH (ref 6.5–8.1)

## 2020-02-01 LAB — TSH: TSH: 20.973 u[IU]/mL — ABNORMAL HIGH (ref 0.350–4.500)

## 2020-02-01 MED ORDER — METOCLOPRAMIDE HCL 5 MG PO TABS: ORAL_TABLET | ORAL | 3 refills | Status: DC

## 2020-02-01 NOTE — Progress Notes (Signed)
Primary Care Physician: Lemmie Evens, MD  Primary Gastroenterologist:  Garfield Cornea, MD   Chief Complaint  Patient presents with  . gastroparesis    f/u    HPI: [name] [name] is a 54 y.o. female here for follow-up of GERD and gastroparesis.  Last seen in March.  Patient notes that she was having significant fatigue for the past several months.  Somewhat better now.  She feels like it might have been related to her thyroid.  She states her thyroid dose have been halved but she was not sure why.  In April 2021 her TSH was 21.795.  On September 10 her TSH was 45.401.  She is now back on 88 mcg daily.  She did note increased nausea although it is controlled with Zofran.  Instead of taking once daily she is now taking 3 times a day.  No vomiting.  Continues to take Reglan a half a tablet in the morning.  Takes pantoprazole 40 mg in the morning for her reflux with good control.  Bowel movements are regular.  No melena or rectal bleeding.  No weight loss.  See labs from September as outlined below.  A1c stable at 7.0.  Creatinine up from 1.91-2.02.  May 2020 she had creatinine of 1.35.  September labs also showed new elevation of AST at 53 and ALT of 74.  Weight up 30 pounds since May 2020 but overall stable since March 2021.  Current Outpatient Medications  Medication Sig Dispense Refill  . acetaminophen (TYLENOL) 500 MG tablet Take 1,000 mg by mouth every 6 (six) hours as needed for headache.    Marland Kitchen atorvastatin (LIPITOR) 20 MG tablet Take 20 mg by mouth daily.    . blood glucose meter kit and supplies Use to check your blood sugar three times a day as directed. (FOR ICD-10 E10.9, E11.9). 1 each 0  . celecoxib (CELEBREX) 100 MG capsule Take 1 capsule (100 mg total) by mouth 2 (two) times daily. 60 capsule 3  . cetirizine (ZYRTEC) 10 MG tablet Take 10 mg by mouth daily.    Marland Kitchen estradiol (ESTRACE) 1 MG tablet Take 1 tablet (1 mg total) by mouth daily. 30 tablet 12  . furosemide  (LASIX) 20 MG tablet Take 20 mg by mouth daily as needed.    . Insulin Isophane & Regular Human (NOVOLIN 70/30 FLEXPEN RELION) (70-30) 100 UNIT/ML PEN Inject 25 Units into the skin 2 (two) times a day. (Patient taking differently: Inject 25 Units into the skin as needed. Only takes if blood sugar is over 150.) 15 mL 11  . Insulin Pen Needle (RELION PEN NEEDLES) 32G X 4 MM MISC Use twice a day as instructed to inject insulin. 200 each 3  . ketorolac (ACULAR) 0.5 % ophthalmic solution SMARTSIG:1 Drop(s) In Eye(s) 5 Times Daily    . LANTUS SOLOSTAR 100 UNIT/ML Solostar Pen Inject 20 Units into the skin at bedtime.    Marland Kitchen levothyroxine (SYNTHROID) 88 MCG tablet Take 88 mcg by mouth daily.    . meloxicam (MOBIC) 15 MG tablet Take 1 tablet (15 mg total) by mouth daily. 30 tablet 3  . metoCLOPramide (REGLAN) 5 MG tablet TAKE 1/2 (ONE-HALF) TABLET BY MOUTH TWICE DAILY AS DIRECTED 30 tablet 3  . metoprolol succinate (TOPROL-XL) 25 MG 24 hr tablet Take 25 mg by mouth daily.    . metoprolol tartrate (LOPRESSOR) 25 MG tablet Take 25 mg by mouth daily.     . ondansetron (ZOFRAN-ODT) 4  MG disintegrating tablet DISSOLVE 1 TABLET IN MOUTH EVERY 8 HOURS AS NEEDED 30 tablet 5  . pantoprazole (PROTONIX) 40 MG tablet Take 1 tablet (40 mg total) by mouth daily before breakfast. 30 tablet 11  . losartan (COZAAR) 25 MG tablet Take 1 tablet (25 mg total) by mouth daily. 30 tablet 3   No current facility-administered medications for this visit.    Allergies as of 02/01/2020 - Review Complete 02/01/2020  Allergen Reaction Noted  . Codeine Nausea And Vomiting     ROS:  General: Negative for anorexia, weight loss, fever, chills, weakness. See hpi ENT: Negative for hoarseness, difficulty swallowing , nasal congestion. CV: Negative for chest pain, angina, palpitations, dyspnea on exertion, peripheral edema.  Respiratory: Negative for dyspnea at rest, dyspnea on exertion, cough, sputum, wheezing.  GI: See history of  present illness. GU:  Negative for dysuria, hematuria, urinary incontinence, urinary frequency, nocturnal urination.  Endo: Negative for unusual weight change.    Physical Examination:   BP 137/80   Pulse 73   Temp (!) 97 F (36.1 C) (Oral)   Ht $R'5\' 1"'vR$  (1.549 m)   Wt 205 lb 12.8 oz (93.4 kg)   BMI 38.89 kg/m   General: Well-nourished, well-developed in no acute distress.  Eyes: No icterus. Mouth: masked Lungs: Clear to auscultation bilaterally.  Heart: Regular rate and rhythm, no murmurs rubs or gallops.  Abdomen: Bowel sounds are normal, nontender, nondistended, no hepatosplenomegaly or masses, no abdominal bruits or hernia , no rebound or guarding.   Extremities: No lower extremity edema. No clubbing or deformities. Neuro: Alert and oriented x 4   Skin: Warm and dry, no jaundice.   Psych: Alert and cooperative, normal mood and affect.  Labs:  Lab Results  Component Value Date   CREATININE 2.02 (H) 01/04/2020   BUN 22 (H) 01/04/2020   NA 141 01/04/2020   K 4.6 01/04/2020   CL 105 01/04/2020   CO2 25 01/04/2020   Lab Results  Component Value Date   ALT 74 (H) 01/04/2020   AST 53 (H) 01/04/2020   ALKPHOS 83 01/04/2020   BILITOT 1.1 01/04/2020   Lab Results  Component Value Date   WBC 6.7 01/04/2020   HGB 11.4 (L) 01/04/2020   HCT 38.9 01/04/2020   MCV 95.8 01/04/2020   PLT 322 01/04/2020    Lab Results  Component Value Date   VITAMINB12 378 09/11/2019   Lab Results  Component Value Date   FOLATE 12.4 09/11/2019   Lab Results  Component Value Date   TSH 45.401 (H) 01/04/2020   Lab Results  Component Value Date   HGBA1C 7.0 (H) 01/04/2020     Imaging Studies: No results found.   Impression/plan:  54 year old female with history of GERD and gastroparesis presenting for follow-up.  Overall symptoms are stable but she has had some decline with notable increase nausea requiring Zofran 3 times daily as opposed to once daily.  This may be secondary to  inadequate control of her hypothyroidism.  GERD symptoms well controlled, now on once daily PPI as opposed to twice daily.  It is unclear if this is in part why she has increased nausea.  For now we will update labs given recent elevation of creatinine, LFTs and TSH.  Continue Reglan half tablet every morning.  Continue Zofran up to 3 times daily as needed.  Continue pantoprazole 40 mg daily.  Office visit in 6 months.

## 2020-02-01 NOTE — Patient Instructions (Signed)
1. Continue Reglan half tablet every morning.  Prescription sent into the pharmacy. 2. Continue Zofran up to 3 times daily as needed.  You may be having worse nausea because of your thyroid levels not being correct. 3. Continue pantoprazole 40 mg in the morning. 4. Update labs to recheck thyroid, anemia, liver numbers.  We will be in touch with results and further recommendations. 5. Return to the office in 6 months.

## 2020-02-03 ENCOUNTER — Encounter: Payer: Self-pay | Admitting: Gastroenterology

## 2020-02-07 ENCOUNTER — Other Ambulatory Visit: Payer: Self-pay

## 2020-02-07 ENCOUNTER — Other Ambulatory Visit: Payer: Self-pay | Admitting: *Deleted

## 2020-02-07 DIAGNOSIS — Z79899 Other long term (current) drug therapy: Secondary | ICD-10-CM

## 2020-02-07 DIAGNOSIS — R899 Unspecified abnormal finding in specimens from other organs, systems and tissues: Secondary | ICD-10-CM

## 2020-02-07 DIAGNOSIS — R7989 Other specified abnormal findings of blood chemistry: Secondary | ICD-10-CM

## 2020-02-07 DIAGNOSIS — R945 Abnormal results of liver function studies: Secondary | ICD-10-CM

## 2020-02-15 ENCOUNTER — Ambulatory Visit (HOSPITAL_COMMUNITY)
Admission: RE | Admit: 2020-02-15 | Discharge: 2020-02-15 | Disposition: A | Discharge status: Home / Self Care | Payer: BC Managed Care – PPO | Source: Ambulatory Visit | Attending: Gastroenterology | Admitting: Gastroenterology

## 2020-02-15 ENCOUNTER — Other Ambulatory Visit: Payer: Self-pay

## 2020-02-15 DIAGNOSIS — R945 Abnormal results of liver function studies: Secondary | ICD-10-CM | POA: Insufficient documentation

## 2020-02-15 DIAGNOSIS — R7989 Other specified abnormal findings of blood chemistry: Secondary | ICD-10-CM

## 2020-02-15 DIAGNOSIS — K760 Fatty (change of) liver, not elsewhere classified: Secondary | ICD-10-CM | POA: Diagnosis not present

## 2020-02-19 ENCOUNTER — Telehealth: Payer: Self-pay | Admitting: Gastroenterology

## 2020-02-19 NOTE — Telephone Encounter (Signed)
Please contact PCP's nurse and advise them that patient had labs with [place] and her TSH is 20.973. Patient reports her TSH in 07/2019 was 21.795 and she was advised to take 1/2 her normal dose of synthroid. Repeat TSH 12/2019 was 45 and she was told to go back to her full dose of 101mcg. Now TSH 20.973.   We are addressing her worsening LFTs.  We have referred her to nephrologist for renal issues, rising creatinine.

## 2020-02-20 NOTE — Telephone Encounter (Signed)
Tried calling Lemmie Evens, MD's office. The answering service voicemail came on and said the memory was full. Not able to leave a message. Will call back.

## 2020-02-20 NOTE — Telephone Encounter (Signed)
Called PCP and there was no answer nor was their a VM. Will call back.

## 2020-02-26 NOTE — Telephone Encounter (Signed)
Also let [name] [name] know that we are addressing her worsening LFTs. We have referred her to nephrologist for renal issues, rising creatinine

## 2020-02-26 NOTE — Telephone Encounter (Signed)
Spoke with Jacqlyn [name] at pts PCP office. She is aware of pts TSH level and previous TSH levels. Becky witll have PCP look at Synthroid dosage and adjust if needed.

## 2020-02-27 DIAGNOSIS — N189 Chronic kidney disease, unspecified: Secondary | ICD-10-CM | POA: Diagnosis not present

## 2020-02-27 DIAGNOSIS — Z79899 Other long term (current) drug therapy: Secondary | ICD-10-CM | POA: Diagnosis not present

## 2020-02-27 DIAGNOSIS — I129 Hypertensive chronic kidney disease with stage 1 through stage 4 chronic kidney disease, or unspecified chronic kidney disease: Secondary | ICD-10-CM | POA: Diagnosis not present

## 2020-02-27 DIAGNOSIS — D638 Anemia in other chronic diseases classified elsewhere: Secondary | ICD-10-CM | POA: Diagnosis not present

## 2020-02-29 DIAGNOSIS — M79671 Pain in right foot: Secondary | ICD-10-CM | POA: Diagnosis not present

## 2020-02-29 DIAGNOSIS — D492 Neoplasm of unspecified behavior of bone, soft tissue, and skin: Secondary | ICD-10-CM | POA: Diagnosis not present

## 2020-02-29 DIAGNOSIS — M79672 Pain in left foot: Secondary | ICD-10-CM | POA: Diagnosis not present

## 2020-03-06 ENCOUNTER — Other Ambulatory Visit (HOSPITAL_COMMUNITY)
Admission: RE | Admit: 2020-03-06 | Discharge: 2020-03-06 | Disposition: A | Discharge status: Home / Self Care | Payer: BC Managed Care – PPO | Source: Ambulatory Visit | Attending: Nephrology | Admitting: Nephrology

## 2020-03-06 ENCOUNTER — Other Ambulatory Visit: Payer: Self-pay

## 2020-03-06 DIAGNOSIS — Z79899 Other long term (current) drug therapy: Secondary | ICD-10-CM | POA: Diagnosis not present

## 2020-03-06 DIAGNOSIS — E1129 Type 2 diabetes mellitus with other diabetic kidney complication: Secondary | ICD-10-CM | POA: Insufficient documentation

## 2020-03-06 DIAGNOSIS — K760 Fatty (change of) liver, not elsewhere classified: Secondary | ICD-10-CM | POA: Insufficient documentation

## 2020-03-06 DIAGNOSIS — R899 Unspecified abnormal finding in specimens from other organs, systems and tissues: Secondary | ICD-10-CM | POA: Insufficient documentation

## 2020-03-06 DIAGNOSIS — D638 Anemia in other chronic diseases classified elsewhere: Secondary | ICD-10-CM | POA: Diagnosis not present

## 2020-03-06 DIAGNOSIS — N189 Chronic kidney disease, unspecified: Secondary | ICD-10-CM | POA: Insufficient documentation

## 2020-03-06 DIAGNOSIS — E1122 Type 2 diabetes mellitus with diabetic chronic kidney disease: Secondary | ICD-10-CM | POA: Diagnosis not present

## 2020-03-06 DIAGNOSIS — E6609 Other obesity due to excess calories: Secondary | ICD-10-CM | POA: Diagnosis not present

## 2020-03-06 DIAGNOSIS — I129 Hypertensive chronic kidney disease with stage 1 through stage 4 chronic kidney disease, or unspecified chronic kidney disease: Secondary | ICD-10-CM | POA: Diagnosis not present

## 2020-03-06 LAB — CBC WITH DIFFERENTIAL/PLATELET
Abs Immature Granulocytes: 0.02 10*3/uL (ref 0.00–0.07)
Basophils Absolute: 0 10*3/uL (ref 0.0–0.1)
Basophils Relative: 1 %
Eosinophils Absolute: 0.3 10*3/uL (ref 0.0–0.5)
Eosinophils Relative: 4 %
HCT: 39 % (ref 36.0–46.0)
Hemoglobin: 11.4 g/dL — ABNORMAL LOW (ref 12.0–15.0)
Immature Granulocytes: 0 %
Lymphocytes Relative: 31 %
Lymphs Abs: 2 10*3/uL (ref 0.7–4.0)
MCH: 28.1 pg (ref 26.0–34.0)
MCHC: 29.2 g/dL — ABNORMAL LOW (ref 30.0–36.0)
MCV: 96.3 fL (ref 80.0–100.0)
Monocytes Absolute: 0.3 10*3/uL (ref 0.1–1.0)
Monocytes Relative: 5 %
Neutro Abs: 3.9 10*3/uL (ref 1.7–7.7)
Neutrophils Relative %: 59 %
Platelets: 314 10*3/uL (ref 150–400)
RBC: 4.05 MIL/uL (ref 3.87–5.11)
RDW: 13.8 % (ref 11.5–15.5)
WBC: 6.5 10*3/uL (ref 4.0–10.5)
nRBC: 0 % (ref 0.0–0.2)

## 2020-03-06 LAB — COMPREHENSIVE METABOLIC PANEL
ALT: 42 U/L (ref 0–44)
AST: 30 U/L (ref 15–41)
Albumin: 3.9 g/dL (ref 3.5–5.0)
Alkaline Phosphatase: 90 U/L (ref 38–126)
Anion gap: 8 (ref 5–15)
BUN: 22 mg/dL — ABNORMAL HIGH (ref 6–20)
CO2: 26 mmol/L (ref 22–32)
Calcium: 9.5 mg/dL (ref 8.9–10.3)
Chloride: 102 mmol/L (ref 98–111)
Creatinine, Ser: 1.81 mg/dL — ABNORMAL HIGH (ref 0.44–1.00)
GFR, Estimated: 33 mL/min — ABNORMAL LOW (ref >60)
Glucose, Bld: 163 mg/dL — ABNORMAL HIGH (ref 70–99)
Potassium: 4 mmol/L (ref 3.5–5.1)
Sodium: 136 mmol/L (ref 135–145)
Total Bilirubin: 1.1 mg/dL (ref 0.3–1.2)
Total Protein: 8 g/dL (ref 6.5–8.1)

## 2020-03-06 LAB — PROTEIN, URINE, 24 HOUR
Collection Interval-UPROT: 24 hours
Urine Total Volume-UPROT: 2700 mL

## 2020-03-06 LAB — HEMOGLOBIN A1C
Hgb A1c MFr Bld: 7 % — ABNORMAL HIGH (ref 4.8–5.6)
Mean Plasma Glucose: 154.2 mg/dL

## 2020-03-06 LAB — IRON AND TIBC
Iron: 67 ug/dL (ref 28–170)
Saturation Ratios: 17 % (ref 10.4–31.8)
TIBC: 398 ug/dL (ref 250–450)
UIBC: 331 ug/dL

## 2020-03-06 LAB — VITAMIN D 25 HYDROXY (VIT D DEFICIENCY, FRACTURES): Vit D, 25-Hydroxy: 38.07 ng/mL (ref 30–100)

## 2020-03-06 LAB — HEPATITIS B SURFACE ANTIBODY,QUALITATIVE: Hep B S Ab: NONREACTIVE

## 2020-03-06 LAB — MAGNESIUM: Magnesium: 1.9 mg/dL (ref 1.7–2.4)

## 2020-03-06 LAB — CREATININE CLEARANCE, URINE, 24 HOUR
Collection Interval-CRCL: 24 hours
Creatinine Clearance: 54 mL/min — ABNORMAL LOW (ref 75–115)
Creatinine, 24H Ur: 1408 mg/d (ref 600–1800)
Creatinine, Urine: 52.15 mg/dL
Urine Total Volume-CRCL: 2700 mL

## 2020-03-06 LAB — PHOSPHORUS: Phosphorus: 3.2 mg/dL (ref 2.5–4.6)

## 2020-03-06 LAB — VITAMIN B12: Vitamin B-12: 399 pg/mL (ref 180–914)

## 2020-03-06 LAB — PROTEIN / CREATININE RATIO, URINE
Creatinine, Urine: 49.73 mg/dL
Total Protein, Urine: <6 mg/dL

## 2020-03-06 LAB — HEPATITIS C ANTIBODY: HCV Ab: NONREACTIVE

## 2020-03-06 LAB — FERRITIN: Ferritin: 182 ng/mL (ref 11–307)

## 2020-03-07 LAB — MPO/PR-3 (ANCA) ANTIBODIES
ANCA Proteinase 3: <3.5 U/mL (ref 0.0–3.5)
Myeloperoxidase Abs: <9 U/mL (ref 0.0–9.0)

## 2020-03-07 LAB — KAPPA/LAMBDA LIGHT CHAINS
Kappa free light chain: 45 mg/L — ABNORMAL HIGH (ref 3.3–19.4)
Kappa, lambda light chain ratio: 1.13 (ref 0.26–1.65)
Lambda free light chains: 40 mg/L — ABNORMAL HIGH (ref 5.7–26.3)

## 2020-03-07 LAB — PTH, INTACT AND CALCIUM
Calcium, Total (PTH): 9.9 mg/dL (ref 8.7–10.2)
PTH: 21 pg/mL (ref 15–65)

## 2020-03-07 LAB — C3 COMPLEMENT: C3 Complement: 192 mg/dL — ABNORMAL HIGH (ref 82–167)

## 2020-03-07 LAB — C4 COMPLEMENT: Complement C4, Body Fluid: 56 mg/dL — ABNORMAL HIGH (ref 12–38)

## 2020-03-07 LAB — ANCA TITERS
Atypical P-ANCA titer: <1:20 {titer}
C-ANCA: <1:20 {titer}
P-ANCA: <1:20 {titer}

## 2020-03-07 LAB — GLOMERULAR BASEMENT MEMBRANE ANTIBODIES: GBM Ab: 3 units (ref 0–20)

## 2020-03-07 LAB — ANA: Anti Nuclear Antibody (ANA): NEGATIVE

## 2020-03-10 LAB — HEPATITIS C GENOTYPE

## 2020-03-11 LAB — MISC LABCORP TEST (SEND OUT): Labcorp test code: 141330

## 2020-03-11 LAB — HIV-1/2 AB - DIFFERENTIATION
HIV 1 Ab: NEGATIVE
HIV 2 Ab: NEGATIVE
Note: NEGATIVE

## 2020-03-11 LAB — RNA QUALITATIVE: HIV 1 RNA Qualitative: 1

## 2020-03-11 LAB — IMMUNOFIXATION, URINE

## 2020-03-18 DIAGNOSIS — R52 Pain, unspecified: Secondary | ICD-10-CM | POA: Diagnosis not present

## 2020-03-18 DIAGNOSIS — L910 Hypertrophic scar: Secondary | ICD-10-CM | POA: Diagnosis not present

## 2020-03-19 ENCOUNTER — Other Ambulatory Visit (HOSPITAL_COMMUNITY)
Admission: RE | Admit: 2020-03-19 | Discharge: 2020-03-19 | Disposition: A | Discharge status: Home / Self Care | Payer: BC Managed Care – PPO | Source: Ambulatory Visit | Attending: Family Medicine | Admitting: Family Medicine

## 2020-03-19 DIAGNOSIS — Z79899 Other long term (current) drug therapy: Secondary | ICD-10-CM | POA: Diagnosis not present

## 2020-03-19 DIAGNOSIS — R945 Abnormal results of liver function studies: Secondary | ICD-10-CM | POA: Insufficient documentation

## 2020-03-20 LAB — ANA: Anti Nuclear Antibody (ANA): NEGATIVE

## 2020-03-20 LAB — IGG, IGA, IGM
IgA: <5 mg/dL — ABNORMAL LOW (ref 87–352)
IgG (Immunoglobin G), Serum: 1781 mg/dL — ABNORMAL HIGH (ref 586–1602)
IgM (Immunoglobulin M), Srm: 98 mg/dL (ref 26–217)

## 2020-03-21 LAB — MITOCHONDRIAL ANTIBODIES: Mitochondrial M2 Ab, IgG: <20 Units (ref 0.0–20.0)

## 2020-03-21 LAB — ANTI-SMOOTH MUSCLE ANTIBODY, IGG: F-Actin IgG: 10 Units (ref 0–19)

## 2020-03-28 DIAGNOSIS — E1165 Type 2 diabetes mellitus with hyperglycemia: Secondary | ICD-10-CM | POA: Diagnosis not present

## 2020-03-28 DIAGNOSIS — R5383 Other fatigue: Secondary | ICD-10-CM | POA: Diagnosis not present

## 2020-03-28 DIAGNOSIS — E039 Hypothyroidism, unspecified: Secondary | ICD-10-CM | POA: Diagnosis not present

## 2020-03-31 ENCOUNTER — Telehealth: Payer: Self-pay | Admitting: Internal Medicine

## 2020-03-31 ENCOUNTER — Other Ambulatory Visit: Payer: Self-pay | Admitting: *Deleted

## 2020-03-31 DIAGNOSIS — R899 Unspecified abnormal finding in specimens from other organs, systems and tissues: Secondary | ICD-10-CM

## 2020-03-31 NOTE — Telephone Encounter (Signed)
Lmom, see result note if needed.

## 2020-03-31 NOTE — Telephone Encounter (Signed)
Returning call. [phone]

## 2020-04-02 DIAGNOSIS — D638 Anemia in other chronic diseases classified elsewhere: Secondary | ICD-10-CM | POA: Diagnosis not present

## 2020-04-02 DIAGNOSIS — N189 Chronic kidney disease, unspecified: Secondary | ICD-10-CM | POA: Diagnosis not present

## 2020-04-02 DIAGNOSIS — I129 Hypertensive chronic kidney disease with stage 1 through stage 4 chronic kidney disease, or unspecified chronic kidney disease: Secondary | ICD-10-CM | POA: Diagnosis not present

## 2020-04-02 DIAGNOSIS — E6609 Other obesity due to excess calories: Secondary | ICD-10-CM | POA: Diagnosis not present

## 2020-04-03 ENCOUNTER — Other Ambulatory Visit (HOSPITAL_COMMUNITY)
Admission: RE | Admit: 2020-04-03 | Discharge: 2020-04-03 | Disposition: A | Discharge status: Home / Self Care | Payer: BC Managed Care – PPO | Source: Ambulatory Visit | Attending: Family Medicine | Admitting: Family Medicine

## 2020-04-03 ENCOUNTER — Other Ambulatory Visit: Payer: Self-pay

## 2020-04-03 DIAGNOSIS — R5383 Other fatigue: Secondary | ICD-10-CM | POA: Diagnosis not present

## 2020-04-03 DIAGNOSIS — E119 Type 2 diabetes mellitus without complications: Secondary | ICD-10-CM | POA: Diagnosis not present

## 2020-04-03 DIAGNOSIS — E039 Hypothyroidism, unspecified: Secondary | ICD-10-CM | POA: Diagnosis not present

## 2020-04-03 LAB — LIPID PANEL
Cholesterol: 158 mg/dL (ref 0–200)
HDL: 41 mg/dL (ref >40)
LDL Cholesterol: 103 mg/dL — ABNORMAL HIGH (ref 0–99)
Total CHOL/HDL Ratio: 3.9 RATIO
Triglycerides: 68 mg/dL (ref <150)
VLDL: 14 mg/dL (ref 0–40)

## 2020-04-03 LAB — COMPREHENSIVE METABOLIC PANEL
ALT: 41 U/L (ref 0–44)
AST: 31 U/L (ref 15–41)
Albumin: 4 g/dL (ref 3.5–5.0)
Alkaline Phosphatase: 113 U/L (ref 38–126)
Anion gap: 7 (ref 5–15)
BUN: 17 mg/dL (ref 6–20)
CO2: 27 mmol/L (ref 22–32)
Calcium: 9.4 mg/dL (ref 8.9–10.3)
Chloride: 105 mmol/L (ref 98–111)
Creatinine, Ser: 1.77 mg/dL — ABNORMAL HIGH (ref 0.44–1.00)
GFR, Estimated: 34 mL/min — ABNORMAL LOW (ref >60)
Glucose, Bld: 118 mg/dL — ABNORMAL HIGH (ref 70–99)
Potassium: 4.3 mmol/L (ref 3.5–5.1)
Sodium: 139 mmol/L (ref 135–145)
Total Bilirubin: 0.9 mg/dL (ref 0.3–1.2)
Total Protein: 8.4 g/dL — ABNORMAL HIGH (ref 6.5–8.1)

## 2020-04-03 LAB — CBC
HCT: 39.9 % (ref 36.0–46.0)
Hemoglobin: 11.7 g/dL — ABNORMAL LOW (ref 12.0–15.0)
MCH: 28 pg (ref 26.0–34.0)
MCHC: 29.3 g/dL — ABNORMAL LOW (ref 30.0–36.0)
MCV: 95.5 fL (ref 80.0–100.0)
Platelets: 330 10*3/uL (ref 150–400)
RBC: 4.18 MIL/uL (ref 3.87–5.11)
RDW: 13.5 % (ref 11.5–15.5)
WBC: 6.7 10*3/uL (ref 4.0–10.5)
nRBC: 0 % (ref 0.0–0.2)

## 2020-04-03 LAB — TSH: TSH: 9.203 u[IU]/mL — ABNORMAL HIGH (ref 0.350–4.500)

## 2020-04-03 LAB — HEMOGLOBIN A1C
Hgb A1c MFr Bld: 6.6 % — ABNORMAL HIGH (ref 4.8–5.6)
Mean Plasma Glucose: 142.72 mg/dL

## 2020-04-07 ENCOUNTER — Ambulatory Visit: Payer: BC Managed Care – PPO | Admitting: Nurse Practitioner

## 2020-04-07 ENCOUNTER — Other Ambulatory Visit: Payer: Self-pay

## 2020-04-07 ENCOUNTER — Encounter: Payer: Self-pay | Admitting: Nurse Practitioner

## 2020-04-07 VITALS — BP 146/84 | HR 80 | Ht 61.0 in | Wt 204.0 lb

## 2020-04-07 DIAGNOSIS — N1832 Chronic kidney disease, stage 3b: Secondary | ICD-10-CM | POA: Diagnosis not present

## 2020-04-07 DIAGNOSIS — I10 Essential (primary) hypertension: Secondary | ICD-10-CM | POA: Diagnosis not present

## 2020-04-07 DIAGNOSIS — E782 Mixed hyperlipidemia: Secondary | ICD-10-CM | POA: Diagnosis not present

## 2020-04-07 DIAGNOSIS — E1122 Type 2 diabetes mellitus with diabetic chronic kidney disease: Secondary | ICD-10-CM | POA: Diagnosis not present

## 2020-04-07 MED ORDER — NOVOLIN 70/30 FLEXPEN RELION (70-30) 100 UNIT/ML ~~LOC~~ SUPN: 8.0000 [IU] | PEN_INJECTOR | Freq: Two times a day (BID) | SUBCUTANEOUS | 3 refills | Status: DC

## 2020-04-07 NOTE — Progress Notes (Signed)
Endocrinology Consult Note       04/07/2020, 3:07 PM   Subjective:    Patient ID: [name] [name], female    DOB: 12/25/1965.  [name] [name] is being seen in consultation for management of currently uncontrolled symptomatic diabetes requested by  Lemmie Evens, MD.   Past Medical History:  Diagnosis Date   Gastroparesis    Grave's disease    diagnosed years ago/ now hypothyroidism   Hypertension    IBS (irritable colon syndrome)    constipation predominant    Past Surgical History:  Procedure Laterality Date   ABDOMINAL HYSTERECTOMY     attempted colonoscopy  03/2004   Dr. Rourk--> prep was poor. Exam to 40 cm. No gross abnormalities. Internal hemorrhoids. Air contrast barium enema was normal.   CHOLECYSTECTOMY     COLONOSCOPY N/A 10/30/2014   RMR: normal. next colonoscopy in 2026.   ESOPHAGEAL DILATION N/A 10/30/2014   Procedure: ESOPHAGEAL DILATION;  Surgeon: Daneil Dolin, MD;  Location: [facility];  Service: Endoscopy;  Laterality: N/A;   ESOPHAGOGASTRODUODENOSCOPY  01/2004   Dr. Almyra Free small polyps in postbulbar area ablated   ESOPHAGOGASTRODUODENOSCOPY N/A 10/30/2014   [id number]   S/P Hysterectomy      TONSILLECTOMY     TUBAL LIGATION      Social History   Socioeconomic History   Marital status: Single    Spouse name: Not on file   Number of children: 1   Years of education: Not on file   Highest education level: Not on file  Occupational History   Occupation: unemployed since July 3    Employer: [employer]  Tobacco Use   Smoking status: Former Smoker    Types: E-cigarettes   Smokeless tobacco: Never Used  Scientific laboratory technician Use: Never used  Substance and Sexual Activity   Alcohol use: Not Currently    Comment: occ   Drug use: No   Sexual activity: Yes    Birth control/protection: Surgical    Comment: hyst  Other Topics Concern    Not on file  Social History Narrative   1 grown healthy daughter   Social Determinants of Health   Financial Resource Strain: Not on file  Food Insecurity: Not on file  Transportation Needs: Not on file  Physical Activity: Not on file  Stress: Not on file  Social Connections: Not on file    Family History  Problem Relation Age of Onset   Other Father 79       hit by train   Heart disease Mother    Kidney disease Mother    Colon cancer Neg Hx     Outpatient Encounter Medications as of 04/07/2020  Medication Sig   acetaminophen (TYLENOL) 500 MG tablet Take 1,000 mg by mouth every 6 (six) hours as needed for headache.   atorvastatin (LIPITOR) 20 MG tablet Take 20 mg by mouth daily.   blood glucose meter kit and supplies Use to check your blood sugar three times a day as directed. (FOR ICD-10 E10.9, E11.9).   cetirizine (ZYRTEC) 10 MG tablet Take 10 mg by mouth daily.   estradiol (ESTRACE) 1 MG tablet Take 1  tablet (1 mg total) by mouth daily.   levothyroxine (SYNTHROID) 88 MCG tablet Take 88 mcg by mouth daily.   losartan (COZAAR) 25 MG tablet Take 1 tablet (25 mg total) by mouth daily.   metoCLOPramide (REGLAN) 5 MG tablet TAKE 1/2 (ONE-HALF) TABLET BY MOUTH TWICE DAILY AS DIRECTED   metoprolol tartrate (LOPRESSOR) 25 MG tablet Take 25 mg by mouth daily.    ondansetron (ZOFRAN-ODT) 4 MG disintegrating tablet DISSOLVE 1 TABLET IN MOUTH EVERY 8 HOURS AS NEEDED   pantoprazole (PROTONIX) 40 MG tablet Take 1 tablet (40 mg total) by mouth daily before breakfast.   phentermine (ADIPEX-P) 37.5 MG tablet Take 37.5 mg by mouth at bedtime.   polyethylene glycol powder (GLYCOLAX/MIRALAX) 17 GM/SCOOP powder Take by mouth.   insulin isophane & regular human (NOVOLIN 70/30 FLEXPEN RELION) (70-30) 100 UNIT/ML KwikPen Inject 8 Units into the skin 2 (two) times daily with a meal.   [DISCONTINUED] celecoxib (CELEBREX) 100 MG capsule Take 1 capsule (100 mg total) by mouth 2  (two) times daily. (Patient not taking: Reported on 04/07/2020)   [DISCONTINUED] furosemide (LASIX) 20 MG tablet Take 20 mg by mouth daily as needed. (Patient not taking: Reported on 04/07/2020)   [DISCONTINUED] Insulin Isophane & Regular Human (NOVOLIN 70/30 FLEXPEN RELION) (70-30) 100 UNIT/ML PEN Inject 25 Units into the skin 2 (two) times a day. (Patient not taking: Reported on 04/07/2020)   [DISCONTINUED] Insulin Pen Needle (RELION PEN NEEDLES) 32G X 4 MM MISC Use twice a day as instructed to inject insulin. (Patient not taking: Reported on 04/07/2020)   [DISCONTINUED] ketorolac (ACULAR) 0.5 % ophthalmic solution SMARTSIG:1 Drop(s) In Eye(s) 5 Times Daily (Patient not taking: Reported on 04/07/2020)   [DISCONTINUED] LANTUS SOLOSTAR 100 UNIT/ML Solostar Pen Inject 20 Units into the skin at bedtime. (Patient not taking: Reported on 04/07/2020)   [DISCONTINUED] meloxicam (MOBIC) 15 MG tablet Take 1 tablet (15 mg total) by mouth daily. (Patient not taking: Reported on 04/07/2020)   [DISCONTINUED] metoprolol succinate (TOPROL-XL) 25 MG 24 hr tablet Take 25 mg by mouth daily.   No facility-administered encounter medications on file as of 04/07/2020.    ALLERGIES: Allergies  Allergen Reactions   Codeine Nausea And Vomiting    VACCINATION STATUS: Immunization History  Administered Date(s) Administered   Tdap 11/18/2011    Diabetes She presents for her initial diabetic visit. She has type 2 diabetes mellitus. Onset time: Was diagnosed 1 year ago at the age of 80. Her disease course has been improving. Hypoglycemia symptoms include nervousness/anxiousness, sweats and tremors. Associated symptoms include fatigue. Pertinent negatives for diabetes include no foot paresthesias, no polydipsia, no polyphagia and no polyuria. Hypoglycemia complications include hospitalization. Symptoms are stable. Diabetic complications include nephropathy. (DKA, gastroparesis) Risk factors for coronary artery  disease include diabetes mellitus, dyslipidemia, hypertension, obesity, sedentary lifestyle and family history. When asked about current treatments, none (She has been on Novolin 70/30 in the past but only takes it if glucose over 150.) were reported. Her weight is increasing steadily (She is on Adipex from her PCP for weight management). She is following a generally healthy diet. Meal planning includes avoidance of concentrated sweets. She has not had a previous visit with a dietitian. 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 Her overall blood glucose range is 130-140 mg/dl. (She presents today for her consultation with no meter or logs to review.  Her most recent A1c was 7% on 03/06/20.  She is not currently taking anything for her  diabetes, has been on Lantus in the past and most recently Novolin 70/30 25 units with breakfast and supper only if her glucose is above 150 (she had to go to the ED for hypoglycemia associated with that insulin dose).  She endorses eating a healthy diet of mostly salads and fish and drinks only water.  ) An ACE inhibitor/angiotensin II receptor blocker is being taken. She sees a podiatrist.Eye exam is current.  Hyperlipidemia This is a chronic problem. The current episode started more than 1 year ago. The problem is controlled. Recent lipid tests were reviewed and are normal. Exacerbating diseases include chronic renal disease, diabetes, hypothyroidism and obesity. Factors aggravating her hyperlipidemia include beta blockers. Current antihyperlipidemic treatment includes statins. The current treatment provides mild improvement of lipids. There are no compliance problems.  Risk factors for coronary artery disease include diabetes mellitus, dyslipidemia, hypertension, obesity and family history.  Hypertension This is a chronic problem. The current episode started more than 1 year ago. The problem has been gradually improving since onset. The  problem is controlled. Associated symptoms include sweats. Agents associated with hypertension include thyroid hormones. Risk factors for coronary artery disease include diabetes mellitus, dyslipidemia, obesity and family history. Past treatments include beta blockers and angiotensin blockers. The current treatment provides mild improvement. There are no compliance problems.  Hypertensive end-organ damage includes kidney disease. Identifiable causes of hypertension include chronic renal disease and a thyroid problem.     Review of systems  Constitutional: + Minimally fluctuating body weight,  current Body mass index is 38.55 kg/m. , + fatigue, no subjective hyperthermia, no subjective hypothermia Eyes: no blurry vision, no xerophthalmia ENT: no sore throat, no nodules palpated in throat, no dysphagia/odynophagia, no hoarseness Cardiovascular: no chest pain, no shortness of breath, no palpitations, mild leg swelling after working all day Respiratory: no cough, no shortness of breath Gastrointestinal: alternating constipation/diarrhea- hx of gastroparesis Musculoskeletal: no muscle/joint aches Skin: no rashes, no hyperemia Neurological: no tremors, no numbness, no tingling, no dizziness Psychiatric: no depression, no anxiety  Objective:    BP (!) 146/84 (BP Location: Right Arm, Patient Position: Sitting)    Pulse 80    Ht $R'5\' 1"'Kn$  (1.549 m)    Wt 204 lb (92.5 kg)    BMI 38.55 kg/m   Wt Readings from Last 3 Encounters:  04/07/20 204 lb (92.5 kg)  02/01/20 205 lb 12.8 oz (93.4 kg)  07/03/19 202 lb 3.2 oz (91.7 kg)     BP Readings from Last 3 Encounters:  04/07/20 (!) 146/84  02/01/20 137/80  08/28/19 125/65     Physical Exam- Limited  Constitutional:  Body mass index is 38.55 kg/m. , not in acute distress, normal state of mind Eyes:  EOMI, no exophthalmos Neck: Supple Cardiovascular: RRR, no murmers, rubs, or gallops, no edema Respiratory: Adequate breathing efforts, no crackles,  rales, rhonchi, or wheezing Musculoskeletal: no gross deformities, strength intact in all four extremities, no gross restriction of joint movements Skin:  no rashes, no hyperemia Neurological: no tremor with outstretched hands   CMP ( most recent) CMP     Component Value Date/Time   NA 139 04/03/2020 1457   K 4.3 04/03/2020 1457   CL 105 04/03/2020 1457   CO2 27 04/03/2020 1457   GLUCOSE 118 (H) 04/03/2020 1457   BUN 17 04/03/2020 1457   CREATININE 1.77 (H) 04/03/2020 1457   CREATININE 1.20 (H) 11/19/2011 1204   CALCIUM 9.4 04/03/2020 1457   CALCIUM 9.9 03/06/2020 1011   PROT  8.4 (H) 04/03/2020 1457   ALBUMIN 4.0 04/03/2020 1457   AST 31 04/03/2020 1457   ALT 41 04/03/2020 1457   ALKPHOS 113 04/03/2020 1457   BILITOT 0.9 04/03/2020 1457   GFRNONAA 34 (L) 04/03/2020 1457   GFRAA 31 (L) 01/04/2020 1047     Diabetic Labs (most recent): Lab Results  Component Value Date   HGBA1C 6.6 (H) 04/03/2020   HGBA1C 7.0 (H) 03/06/2020   HGBA1C 7.0 (H) 01/04/2020     Lipid Panel ( most recent) Lipid Panel     Component Value Date/Time   CHOL 158 04/03/2020 1457   TRIG 68 04/03/2020 1457   HDL 41 04/03/2020 1457   CHOLHDL 3.9 04/03/2020 1457   VLDL 14 04/03/2020 1457   LDLCALC 103 (H) 04/03/2020 1457      Lab Results  Component Value Date   TSH 9.203 (H) 04/03/2020   TSH 20.973 (H) 02/01/2020   TSH 45.401 (H) 01/04/2020   TSH 21.795 (H) 08/17/2019   TSH 2.344 11/19/2011   TSH 1.76 11/18/2010           Assessment & Plan:   1) Uncontrolled Type 2 Diabetes with CKD stage 3B  She presents today for her consultation with no meter or logs to review.  Her most recent A1c was 6.6% on 03/06/20.  She is not currently taking anything for her diabetes, has been on Lantus in the past and most recently Novolin 70/30 25 units with breakfast and supper only if her glucose is above 150 (she had to go to the ED for hypoglycemia associated with that insulin dose).  She endorses  eating a healthy diet of mostly salads and fish and drinks only water.    - [name] [name] has currently uncontrolled symptomatic type 2 DM since 54 years of age, with most recent A1c of 6.6 %.  Her A1c has been as high as 15% upon her initial diagnosis last year. Recent labs reviewed.  - I had a long discussion with her about the progressive nature of diabetes and the pathology behind its complications. -her diabetes is complicated by CKD stage 3B, gastroparesis, and DKA and she remains at a high risk for more acute and chronic complications which include CAD, CVA, CKD, retinopathy, and neuropathy. These are all discussed in detail with her.  - I have counseled her on diet  and weight management  by adopting a carbohydrate restricted/protein rich diet. Patient is encouraged to switch to  unprocessed or minimally processed     complex starch and increased protein intake (animal or plant source), fruits, and vegetables. -  she is advised to stick to a routine mealtimes to eat 3 meals  a day and avoid unnecessary snacks ( to snack only to correct hypoglycemia).   - she acknowledges that there is a room for improvement in her food and drink choices. - Suggestion is made for her to avoid simple carbohydrates  from her diet including Cakes, Sweet Desserts, Ice Cream, Soda (diet and regular), Sweet Tea, Candies, Chips, Cookies, Store Bought Juices, Alcohol in Excess of  1-2 drinks a day, Artificial Sweeteners,  Coffee Creamer, and "Sugar-free" Products. This will help patient to have more stable blood glucose profile and potentially avoid unintended weight gain.  - she will be scheduled with Jearld Fenton, RDN, CDE for diabetes education.  - I have approached her with the following individualized plan to manage  her diabetes and patient agrees:   -She will be restarted on her  premixed insulin today for simplicity purposes.    -She is advised to start Novolin 70/30 8 units with breakfast and 8 units  with supper if glucose is above 90 and she is eating.  -She is encouraged to start monitoring blood glucose 4 times daily, before meals and before bed, and to log her readings on the clinic sheets provided to review at her follow up appointment in 2 weeks.  - she is warned not to take insulin without proper monitoring per orders.  - Adjustment parameters are given to her for hypo and hyperglycemia in writing.  - she is encouraged to call clinic for blood glucose levels less than 70 or above 300 mg /dl.  - she is not a candidate for Metformin due to concurrent renal insufficiency.  - she will be considered for incretin therapy as appropriate next visit.  - Specific targets for  A1c;  LDL, HDL,  and Triglycerides were discussed with the patient.  2) Blood Pressure /Hypertension: Her blood pressure is controlled to target.  She is advised to continue her Losartan 25 mg po daily and Metoprolol 25 mg po daily.  3) Lipids/Hyperlipidemia:    Her most recent lipid panel from 04/03/20 shows uncontrolled LDL of 103.  She is advised to continue her Lipitor 20 mg po daily at bedtime.  Side effects and precautions discussed with her.  4)  Weight/Diet:  Her Body mass index is 38.55 kg/m.  - clearly complicating her diabetes care.   she is a candidate for modest weight loss. I discussed with her the fact that loss of 5 - 10% of her  current body weight will have the most impact on her diabetes management.  Exercise, and detailed carbohydrates information provided  -  detailed on discharge instructions.  5) Chronic Care/Health Maintenance: -she is on ACEI/ARB and Statin medications and  is encouraged to initiate and continue to follow up with Ophthalmology, Dentist,  Podiatrist at least yearly or according to recommendations, and advised to stay away from smoking. I have recommended yearly flu vaccine and pneumonia vaccine at least every 5 years; moderate intensity exercise for up to 150 minutes weekly;  and  sleep for at least 7 hours a day.  - she is  advised to maintain close follow up with Lemmie Evens, MD for primary care needs, as well as her other providers for optimal and coordinated care.   - Time spent in this patient care: 60 min, of which > 50% was spent in  counseling  her about her diabetes and the rest reviewing her blood glucose logs , discussing her hypoglycemia and hyperglycemia episodes, reviewing her current and  previous labs / studies  ( including abstraction from other facilities) and medications  doses and developing a  long term treatment plan based on the latest standards of care/ guidelines; and documenting her care.    Please refer to Patient Instructions for Blood Glucose Monitoring and Insulin/Medications Dosing Guide"  in media tab for additional information. Please  also refer to " Patient Self Inventory" in the Media  tab for reviewed elements of pertinent patient history.  [name] participated in the discussions, expressed understanding, and voiced agreement with the above plans.  All questions were answered to her satisfaction. she is encouraged to contact clinic should she have any questions or concerns prior to her return visit.   Follow up plan: - Return in about 2 weeks (around 04/21/2020) for Diabetes follow up, Bring glucometer and logs.  Rayetta Pigg, [facility] [address] [place], [place] [address] Phone: [phone] Fax: [phone]  04/07/2020, 3:07 PM

## 2020-04-07 NOTE — Patient Instructions (Signed)

## 2020-04-08 ENCOUNTER — Other Ambulatory Visit: Payer: Self-pay | Admitting: Nurse Practitioner

## 2020-04-08 DIAGNOSIS — N189 Chronic kidney disease, unspecified: Secondary | ICD-10-CM | POA: Diagnosis not present

## 2020-04-08 DIAGNOSIS — I10 Essential (primary) hypertension: Secondary | ICD-10-CM | POA: Diagnosis not present

## 2020-04-08 DIAGNOSIS — E1143 Type 2 diabetes mellitus with diabetic autonomic (poly)neuropathy: Secondary | ICD-10-CM | POA: Diagnosis not present

## 2020-04-08 MED ORDER — LANTUS SOLOSTAR 100 UNIT/ML ~~LOC~~ SOPN: 10.0000 [IU] | PEN_INJECTOR | Freq: Every day | SUBCUTANEOUS | 1 refills | Status: DC

## 2020-04-08 MED ORDER — LANTUS SOLOSTAR 100 UNIT/ML ~~LOC~~ SOPN: 10.0000 [IU] | PEN_INJECTOR | Freq: Every day | SUBCUTANEOUS | 99 refills | Status: DC

## 2020-04-15 ENCOUNTER — Other Ambulatory Visit: Payer: Self-pay

## 2020-04-15 ENCOUNTER — Other Ambulatory Visit (HOSPITAL_COMMUNITY)
Admission: RE | Admit: 2020-04-15 | Discharge: 2020-04-15 | Disposition: A | Discharge status: Home / Self Care | Payer: BC Managed Care – PPO | Source: Ambulatory Visit | Attending: Family Medicine | Admitting: Family Medicine

## 2020-04-15 DIAGNOSIS — N189 Chronic kidney disease, unspecified: Secondary | ICD-10-CM | POA: Diagnosis not present

## 2020-04-15 DIAGNOSIS — E1122 Type 2 diabetes mellitus with diabetic chronic kidney disease: Secondary | ICD-10-CM | POA: Insufficient documentation

## 2020-04-15 DIAGNOSIS — E669 Obesity, unspecified: Secondary | ICD-10-CM | POA: Diagnosis not present

## 2020-04-15 LAB — TSH: TSH: 4.654 u[IU]/mL — ABNORMAL HIGH (ref 0.350–4.500)

## 2020-04-22 ENCOUNTER — Ambulatory Visit: Payer: BC Managed Care – PPO | Admitting: Nurse Practitioner

## 2020-05-05 DIAGNOSIS — I10 Essential (primary) hypertension: Secondary | ICD-10-CM | POA: Diagnosis not present

## 2020-05-05 DIAGNOSIS — E1143 Type 2 diabetes mellitus with diabetic autonomic (poly)neuropathy: Secondary | ICD-10-CM | POA: Diagnosis not present

## 2020-05-05 DIAGNOSIS — N189 Chronic kidney disease, unspecified: Secondary | ICD-10-CM | POA: Diagnosis not present

## 2020-05-15 ENCOUNTER — Other Ambulatory Visit: Payer: Self-pay

## 2020-05-15 ENCOUNTER — Ambulatory Visit: Payer: BC Managed Care – PPO | Admitting: Gastroenterology

## 2020-05-15 ENCOUNTER — Encounter: Payer: Self-pay | Admitting: Gastroenterology

## 2020-05-15 VITALS — BP 137/78 | HR 104 | Temp 97.0°F | Ht 61.0 in | Wt 198.0 lb

## 2020-05-15 DIAGNOSIS — R1013 Epigastric pain: Secondary | ICD-10-CM

## 2020-05-15 DIAGNOSIS — K3184 Gastroparesis: Secondary | ICD-10-CM

## 2020-05-15 MED ORDER — PANTOPRAZOLE SODIUM 40 MG PO TBEC
40.0000 mg | DELAYED_RELEASE_TABLET | Freq: Two times a day (BID) | ORAL | 5 refills | Status: DC
Start: 2020-05-15 — End: 2020-06-18

## 2020-05-15 MED ORDER — PROMETHAZINE HCL 12.5 MG PO TABS: 12.5000 mg | ORAL_TABLET | Freq: Four times a day (QID) | ORAL | 1 refills | Status: DC | PRN

## 2020-05-15 NOTE — Progress Notes (Signed)
Referring Provider: Lemmie Evens, MD Primary Care Physician:  Lemmie Evens, MD Primary GI: Dr. Gala Romney   Chief Complaint  Patient presents with  . [name] [name]    Daily, occas vomiting, only can eat very little    HPI:   [name] [name] is a 55 y.o. female presenting today with a history of GERD, gastroparesis, fatty liver for routine follow-up. Last seen in October 2021. At that time, she had noticed increased nausea but no vomiting.    Since Jan 7th had sore throat, cough, nausea. Afebrile. Sore throat and cough improved. Nausea still persists. Feels like she has worsened since October 2021 and not at her baseline. Vomiting yesterday. Feels nausea worsened than her baseline. Vomiting once yesterday. Eats small amount of food. Now eating half portions. No dysphagia. No GERD flares. No fever. No NSAIDs. Only tylenol products. Mild epigastric discomfort. Protonix once daily. Takes Zofran like it's candy. Zofran doesn't help to relieve the nausea. Reglan 2.5 mg BID. Not on phenergan currently.   TSH now improved, previously markedly abnormal around 45. Most recently 4.65. Mild normocytic anemia but with normal iron and ferritin. Likely due to chronic disease. Creatinine 1.77 recently.   Past Medical History:  Diagnosis Date  . Gastroparesis   . Grave's disease    diagnosed years ago/ now hypothyroidism  . Hypertension   . IBS (irritable colon syndrome)    constipation predominant    Past Surgical History:  Procedure Laterality Date  . ABDOMINAL HYSTERECTOMY    . attempted colonoscopy  03/2004   Dr. Rourk--> prep was poor. Exam to 40 cm. No gross abnormalities. Internal hemorrhoids. Air contrast barium enema was normal.  . CHOLECYSTECTOMY    . COLONOSCOPY N/A 10/30/2014   RMR: normal. next colonoscopy in 2026.  Marland Kitchen ESOPHAGEAL DILATION N/A 10/30/2014   Procedure: ESOPHAGEAL DILATION;  Surgeon: Daneil Dolin, MD;  Location: [facility];  Service: Endoscopy;  Laterality: N/A;  .  ESOPHAGOGASTRODUODENOSCOPY  01/2004   Dr. Almyra Free small polyps in postbulbar area ablated  . ESOPHAGOGASTRODUODENOSCOPY N/A 10/30/2014   ONG:[id number]  . S/P Hysterectomy     . TONSILLECTOMY    . TUBAL LIGATION      Current Outpatient Medications  Medication Sig Dispense Refill  . acetaminophen (TYLENOL) 500 MG tablet Take 1,000 mg by mouth every 6 (six) hours as needed for headache.    Marland Kitchen atorvastatin (LIPITOR) 20 MG tablet Take 20 mg by mouth daily.    . cetirizine (ZYRTEC) 10 MG tablet Take 10 mg by mouth daily.    Marland Kitchen estradiol (ESTRACE) 1 MG tablet Take 1 tablet (1 mg total) by mouth daily. 30 tablet 12  . insulin glargine (LANTUS SOLOSTAR) 100 UNIT/ML Solostar Pen Inject 10 Units into the skin at bedtime. 15 mL 1  . levothyroxine (SYNTHROID) 88 MCG tablet Take 88 mcg by mouth daily.    Marland Kitchen losartan (COZAAR) 25 MG tablet Take 1 tablet (25 mg total) by mouth daily. 30 tablet 3  . metoCLOPramide (REGLAN) 5 MG tablet TAKE 1/2 (ONE-HALF) TABLET BY MOUTH TWICE DAILY AS DIRECTED 30 tablet 3  . metoprolol tartrate (LOPRESSOR) 25 MG tablet Take 25 mg by mouth daily.     . ondansetron (ZOFRAN-ODT) 4 MG disintegrating tablet DISSOLVE 1 TABLET IN MOUTH EVERY 8 HOURS AS NEEDED 30 tablet 5  . polyethylene glycol powder (GLYCOLAX/MIRALAX) 17 GM/SCOOP powder Take by mouth daily.    . promethazine (PHENERGAN) 12.5 MG tablet Take 1 tablet (12.5 mg total) by mouth  every 6 (six) hours as needed for nausea or vomiting. 30 tablet 1  . blood glucose meter kit and supplies Use to check your blood sugar three times a day as directed. (FOR ICD-10 E10.9, E11.9). 1 each 0  . pantoprazole (PROTONIX) 40 MG tablet Take 1 tablet (40 mg total) by mouth 2 (two) times daily before a meal. 60 tablet 5   No current facility-administered medications for this visit.    Allergies as of 05/15/2020 - Review Complete 05/15/2020  Allergen Reaction Noted  . Codeine Nausea And Vomiting     Family History  Problem Relation Age  of Onset  . Other Father 65       hit by train  . Heart disease Mother   . Kidney disease Mother   . Colon cancer Neg Hx     Social History   Socioeconomic History  . Marital status: Single    Spouse name: Not on file  . Number of children: 1  . Years of education: Not on file  . Highest education level: Not on file  Occupational History  . Occupation: unemployed since July 3    Employer: [employer]  Tobacco Use  . Smoking status: Former Smoker    Types: E-cigarettes  . Smokeless tobacco: Never Used  Vaping Use  . Vaping Use: Never used  Substance and Sexual Activity  . Alcohol use: Not Currently    Comment: occ  . Drug use: No  . Sexual activity: Yes    Birth control/protection: Surgical    Comment: hyst  Other Topics Concern  . Not on file  Social History Narrative   1 grown healthy daughter   Social Determinants of Health   Financial Resource Strain: Not on file  Food Insecurity: Not on file  Transportation Needs: Not on file  Physical Activity: Not on file  Stress: Not on file  Social Connections: Not on file    Review of Systems: Gen: Denies fever, chills, anorexia. Denies fatigue, weakness, weight loss.  CV: Denies chest pain, palpitations, syncope, peripheral edema, and claudication. Resp: Denies dyspnea at rest, cough, wheezing, coughing up blood, and pleurisy. GI: see HPI  Derm: Denies rash, itching, dry skin Psych: Denies depression, anxiety, memory loss, confusion. No homicidal or suicidal ideation.  Heme: Denies bruising, bleeding, and enlarged lymph nodes.  Physical Exam: BP 137/78   Pulse (!) 104   Temp (!) 97 F (36.1 C)   Ht _0  (1.549 m)   Wt 198 lb (89.8 kg)   BMI 37.41 kg/m  General:   Alert and oriented. No distress noted. Pleasant and cooperative.  Head:  Normocephalic and atraumatic. Eyes:  Conjuctiva clear without scleral icterus. Mouth:  Mask in place Cardiac: S1 S2 present without obvious murmur Lungs: clear  bilaterally Abdomen:  +BS, soft, mild TTP epigastric and non-distended. No rebound or guarding. No HSM or masses noted. Msk:  Symmetrical without gross deformities. Normal posture. Extremities:  Without edema. Neurologic:  Alert and  oriented x4 Psych:  Alert and cooperative. Normal mood and affect.  Lab Results  Component Value Date   HGBA1C 6.6 (H) 04/03/2020     ASSESSMENT: [name] [name] is a 55 y.o. female presenting today with history of chronic GERD, gastroparesis, and fatty liver, now with persistent nausea worse from baseline and mild epigastric discomfort. Gallbladder absent.   Historically, she has done well with once daily PPI dosing and Zofran as needed; however, Zofran is not helpful any longer. Very low dose  Reglan 2.5 mg BID has been helpful in past but now no longer effective. No side effects. Vague epigastric discomfort noted but denies NSAIDs. Last EGD normal in 2016.  Could have worsening gastroparesis in setting of recent upper respiratory issues (although now improving); abdominal pain not typically seen with gastroparesis but not unheard of. I feel we should maximize supportive measures and can pursue diagnostic EGD for dyspepsia to rule out any other contributing factors such as gastritis, PUD.    PLAN:  Increase PPI to BID  Phenergan low dose 12.5 mg as needed: instructed to not drive while taking due to side effects  May need to increase Reglan to 5 mg BID  Proceed with upper endoscopy by Dr. Gala Romney in near future with Propofol: the risks, benefits, and alternatives have been discussed with the patient in detail. The patient states understanding and desires to proceed.   Follow-up thereafter   Annitta Needs, PhD, ANP-BC [facility]

## 2020-05-15 NOTE — Telephone Encounter (Signed)
error 

## 2020-05-15 NOTE — H&P (View-Only) (Signed)
  Referring Provider: Knowlton, Steve, MD Primary Care Physician:  Knowlton, Steve, MD Primary GI: Dr. Rourk   Chief Complaint  Patient presents with  . nauasea    Daily, occas vomiting, only can eat very little    HPI:   [name] [name] is a 54 y.o. female presenting today with a history of GERD, gastroparesis, fatty liver for routine follow-up. Last seen in October 2021. At that time, she had noticed increased nausea but no vomiting.    Since Jan 7th had sore throat, cough, nausea. Afebrile. Sore throat and cough improved. Nausea still persists. Feels like she has worsened since October 2021 and not at her baseline. Vomiting yesterday. Feels nausea worsened than her baseline. Vomiting once yesterday. Eats small amount of food. Now eating half portions. No dysphagia. No GERD flares. No fever. No NSAIDs. Only tylenol products. Mild epigastric discomfort. Protonix once daily. Takes Zofran like it's candy. Zofran doesn't help to relieve the nausea. Reglan 2.5 mg BID. Not on phenergan currently.   TSH now improved, previously markedly abnormal around 45. Most recently 4.65. Mild normocytic anemia but with normal iron and ferritin. Likely due to chronic disease. Creatinine 1.77 recently.   Past Medical History:  Diagnosis Date  . Gastroparesis   . Grave's disease    diagnosed years ago/ now hypothyroidism  . Hypertension   . IBS (irritable colon syndrome)    constipation predominant    Past Surgical History:  Procedure Laterality Date  . ABDOMINAL HYSTERECTOMY    . attempted colonoscopy  03/2004   Dr. Rourk--> prep was poor. Exam to 40 cm. No gross abnormalities. Internal hemorrhoids. Air contrast barium enema was normal.  . CHOLECYSTECTOMY    . COLONOSCOPY N/A 10/30/2014   RMR: normal. next colonoscopy in 2026.  . ESOPHAGEAL DILATION N/A 10/30/2014   Procedure: ESOPHAGEAL DILATION;  Surgeon: Robert M Rourk, MD;  Location: [facility];  Service: Endoscopy;  Laterality: N/A;  .  ESOPHAGOGASTRODUODENOSCOPY  01/2004   Dr. Rehman-->two small polyps in postbulbar area ablated  . ESOPHAGOGASTRODUODENOSCOPY N/A 10/30/2014   RMR:normal  . S/P Hysterectomy     . TONSILLECTOMY    . TUBAL LIGATION      Current Outpatient Medications  Medication Sig Dispense Refill  . acetaminophen (TYLENOL) 500 MG tablet Take 1,000 mg by mouth every 6 (six) hours as needed for headache.    . atorvastatin (LIPITOR) 20 MG tablet Take 20 mg by mouth daily.    . cetirizine (ZYRTEC) 10 MG tablet Take 10 mg by mouth daily.    . estradiol (ESTRACE) 1 MG tablet Take 1 tablet (1 mg total) by mouth daily. 30 tablet 12  . insulin glargine (LANTUS SOLOSTAR) 100 UNIT/ML Solostar Pen Inject 10 Units into the skin at bedtime. 15 mL 1  . levothyroxine (SYNTHROID) 88 MCG tablet Take 88 mcg by mouth daily.    . losartan (COZAAR) 25 MG tablet Take 1 tablet (25 mg total) by mouth daily. 30 tablet 3  . metoCLOPramide (REGLAN) 5 MG tablet TAKE 1/2 (ONE-HALF) TABLET BY MOUTH TWICE DAILY AS DIRECTED 30 tablet 3  . metoprolol tartrate (LOPRESSOR) 25 MG tablet Take 25 mg by mouth daily.     . ondansetron (ZOFRAN-ODT) 4 MG disintegrating tablet DISSOLVE 1 TABLET IN MOUTH EVERY 8 HOURS AS NEEDED 30 tablet 5  . polyethylene glycol powder (GLYCOLAX/MIRALAX) 17 GM/SCOOP powder Take by mouth daily.    . promethazine (PHENERGAN) 12.5 MG tablet Take 1 tablet (12.5 mg total) by mouth   every 6 (six) hours as needed for nausea or vomiting. 30 tablet 1  . blood glucose meter kit and supplies Use to check your blood sugar three times a day as directed. (FOR ICD-10 E10.9, E11.9). 1 each 0  . pantoprazole (PROTONIX) 40 MG tablet Take 1 tablet (40 mg total) by mouth 2 (two) times daily before a meal. 60 tablet 5   No current facility-administered medications for this visit.    Allergies as of 05/15/2020 - Review Complete 05/15/2020  Allergen Reaction Noted  . Codeine Nausea And Vomiting     Family History  Problem Relation Age  of Onset  . Other Father 34       hit by train  . Heart disease Mother   . Kidney disease Mother   . Colon cancer Neg Hx     Social History   Socioeconomic History  . Marital status: Single    Spouse name: Not on file  . Number of children: 1  . Years of education: Not on file  . Highest education level: Not on file  Occupational History  . Occupation: unemployed since July 3    Employer: [employer]  Tobacco Use  . Smoking status: Former Smoker    Types: E-cigarettes  . Smokeless tobacco: Never Used  Vaping Use  . Vaping Use: Never used  Substance and Sexual Activity  . Alcohol use: Not Currently    Comment: occ  . Drug use: No  . Sexual activity: Yes    Birth control/protection: Surgical    Comment: hyst  Other Topics Concern  . Not on file  Social History Narrative   1 grown healthy daughter   Social Determinants of Health   Financial Resource Strain: Not on file  Food Insecurity: Not on file  Transportation Needs: Not on file  Physical Activity: Not on file  Stress: Not on file  Social Connections: Not on file    Review of Systems: Gen: Denies fever, chills, anorexia. Denies fatigue, weakness, weight loss.  CV: Denies chest pain, palpitations, syncope, peripheral edema, and claudication. Resp: Denies dyspnea at rest, cough, wheezing, coughing up blood, and pleurisy. GI: see HPI  Derm: Denies rash, itching, dry skin Psych: Denies depression, anxiety, memory loss, confusion. No homicidal or suicidal ideation.  Heme: Denies bruising, bleeding, and enlarged lymph nodes.  Physical Exam: BP 137/78   Pulse (!) 104   Temp (!) 97 F (36.1 C)   Ht 5' 1" (1.549 m)   Wt 198 lb (89.8 kg)   BMI 37.41 kg/m  General:   Alert and oriented. No distress noted. Pleasant and cooperative.  Head:  Normocephalic and atraumatic. Eyes:  Conjuctiva clear without scleral icterus. Mouth:  Mask in place Cardiac: S1 S2 present without obvious murmur Lungs: clear  bilaterally Abdomen:  +BS, soft, mild TTP epigastric and non-distended. No rebound or guarding. No HSM or masses noted. Msk:  Symmetrical without gross deformities. Normal posture. Extremities:  Without edema. Neurologic:  Alert and  oriented x4 Psych:  Alert and cooperative. Normal mood and affect.  Lab Results  Component Value Date   HGBA1C 6.6 (H) 04/03/2020     ASSESSMENT: [name] [name] is a 54 y.o. female presenting today with history of chronic GERD, gastroparesis, and fatty liver, now with persistent nausea worse from baseline and mild epigastric discomfort. Gallbladder absent.   Historically, she has done well with once daily PPI dosing and Zofran as needed; however, Zofran is not helpful any longer. Very low dose   Reglan 2.5 mg BID has been helpful in past but now no longer effective. No side effects. Vague epigastric discomfort noted but denies NSAIDs. Last EGD normal in 2016.  Could have worsening gastroparesis in setting of recent upper respiratory issues (although now improving); abdominal pain not typically seen with gastroparesis but not unheard of. I feel we should maximize supportive measures and can pursue diagnostic EGD for dyspepsia to rule out any other contributing factors such as gastritis, PUD.    PLAN:  Increase PPI to BID  Phenergan low dose 12.5 mg as needed: instructed to not drive while taking due to side effects  May need to increase Reglan to 5 mg BID  Proceed with upper endoscopy by Dr. Rourk in near future with Propofol: the risks, benefits, and alternatives have been discussed with the patient in detail. The patient states understanding and desires to proceed.   Follow-up thereafter   Midori Dado W. Lillar Bianca, PhD, ANP-BC [facility]     

## 2020-05-15 NOTE — Patient Instructions (Signed)
We are arranging an upper endoscopy with Dr. Gala Romney in the near future.  In the meantime, let's increase Protonix to twice a day, 30 minutes before breakfast and dinner.  I have sent in promethazine (Phenergan) for nausea to take as needed. Make sure not to drive while using this as it can make you very drowsy.  If needed, you can take the whole tablet of Reglan twice a day (5 milligrams twice a day instead of half of tablet twice a day).   Further recommendations to follow!  I enjoyed seeing you again today! As you know, I value our relationship and want to provide genuine, compassionate, and quality care. I welcome your feedback. If you receive a survey regarding your visit,  I greatly appreciate you taking time to fill this out. See you next time!  Annitta Needs, PhD, ANP-BC [facility]

## 2020-05-20 DIAGNOSIS — M79671 Pain in right foot: Secondary | ICD-10-CM | POA: Diagnosis not present

## 2020-05-20 DIAGNOSIS — M79672 Pain in left foot: Secondary | ICD-10-CM | POA: Diagnosis not present

## 2020-05-20 DIAGNOSIS — R234 Changes in skin texture: Secondary | ICD-10-CM | POA: Diagnosis not present

## 2020-06-03 ENCOUNTER — Other Ambulatory Visit: Payer: Self-pay | Admitting: Nurse Practitioner

## 2020-06-03 DIAGNOSIS — K3184 Gastroparesis: Secondary | ICD-10-CM

## 2020-06-11 ENCOUNTER — Other Ambulatory Visit (HOSPITAL_COMMUNITY)
Admission: RE | Admit: 2020-06-11 | Discharge: 2020-06-11 | Disposition: A | Discharge status: Home / Self Care | Payer: BC Managed Care – PPO | Source: Ambulatory Visit | Attending: Internal Medicine | Admitting: Internal Medicine

## 2020-06-11 ENCOUNTER — Other Ambulatory Visit: Payer: Self-pay

## 2020-06-11 DIAGNOSIS — D649 Anemia, unspecified: Secondary | ICD-10-CM | POA: Diagnosis not present

## 2020-06-11 DIAGNOSIS — Z79899 Other long term (current) drug therapy: Secondary | ICD-10-CM | POA: Diagnosis not present

## 2020-06-11 DIAGNOSIS — Z9049 Acquired absence of other specified parts of digestive tract: Secondary | ICD-10-CM | POA: Diagnosis not present

## 2020-06-11 DIAGNOSIS — K3184 Gastroparesis: Secondary | ICD-10-CM | POA: Diagnosis not present

## 2020-06-11 DIAGNOSIS — Z794 Long term (current) use of insulin: Secondary | ICD-10-CM | POA: Diagnosis not present

## 2020-06-11 DIAGNOSIS — E1143 Type 2 diabetes mellitus with diabetic autonomic (poly)neuropathy: Secondary | ICD-10-CM | POA: Diagnosis not present

## 2020-06-11 DIAGNOSIS — Z01812 Encounter for preprocedural laboratory examination: Secondary | ICD-10-CM | POA: Insufficient documentation

## 2020-06-11 DIAGNOSIS — Z87891 Personal history of nicotine dependence: Secondary | ICD-10-CM | POA: Diagnosis not present

## 2020-06-11 DIAGNOSIS — R112 Nausea with vomiting, unspecified: Secondary | ICD-10-CM | POA: Diagnosis not present

## 2020-06-11 DIAGNOSIS — Z79890 Hormone replacement therapy (postmenopausal): Secondary | ICD-10-CM | POA: Diagnosis not present

## 2020-06-11 DIAGNOSIS — K219 Gastro-esophageal reflux disease without esophagitis: Secondary | ICD-10-CM | POA: Diagnosis not present

## 2020-06-11 DIAGNOSIS — Z20822 Contact with and (suspected) exposure to covid-19: Secondary | ICD-10-CM | POA: Insufficient documentation

## 2020-06-12 ENCOUNTER — Other Ambulatory Visit: Payer: Self-pay | Admitting: Gastroenterology

## 2020-06-12 LAB — SARS CORONAVIRUS 2 (TAT 6-24 HRS): SARS Coronavirus 2: NEGATIVE

## 2020-06-13 ENCOUNTER — Ambulatory Visit (HOSPITAL_COMMUNITY): Payer: BC Managed Care – PPO | Admitting: Anesthesiology

## 2020-06-13 ENCOUNTER — Encounter (HOSPITAL_COMMUNITY)
Admission: RE | Disposition: A | Discharge status: Home / Self Care | Payer: Self-pay | Source: Home / Self Care | Attending: Internal Medicine

## 2020-06-13 ENCOUNTER — Other Ambulatory Visit: Payer: Self-pay

## 2020-06-13 ENCOUNTER — Ambulatory Visit (HOSPITAL_COMMUNITY)
Admission: RE | Admit: 2020-06-13 | Discharge: 2020-06-13 | Disposition: A | Discharge status: Home / Self Care | Payer: BC Managed Care – PPO | Attending: Internal Medicine | Admitting: Internal Medicine

## 2020-06-13 ENCOUNTER — Encounter (HOSPITAL_COMMUNITY): Payer: Self-pay | Admitting: Internal Medicine

## 2020-06-13 DIAGNOSIS — K3184 Gastroparesis: Secondary | ICD-10-CM | POA: Diagnosis not present

## 2020-06-13 DIAGNOSIS — R112 Nausea with vomiting, unspecified: Secondary | ICD-10-CM | POA: Diagnosis not present

## 2020-06-13 DIAGNOSIS — E1143 Type 2 diabetes mellitus with diabetic autonomic (poly)neuropathy: Secondary | ICD-10-CM | POA: Insufficient documentation

## 2020-06-13 DIAGNOSIS — K219 Gastro-esophageal reflux disease without esophagitis: Secondary | ICD-10-CM | POA: Diagnosis not present

## 2020-06-13 DIAGNOSIS — Z20822 Contact with and (suspected) exposure to covid-19: Secondary | ICD-10-CM | POA: Diagnosis not present

## 2020-06-13 DIAGNOSIS — Z9049 Acquired absence of other specified parts of digestive tract: Secondary | ICD-10-CM | POA: Insufficient documentation

## 2020-06-13 DIAGNOSIS — Z79890 Hormone replacement therapy (postmenopausal): Secondary | ICD-10-CM | POA: Insufficient documentation

## 2020-06-13 DIAGNOSIS — D649 Anemia, unspecified: Secondary | ICD-10-CM | POA: Insufficient documentation

## 2020-06-13 DIAGNOSIS — Z794 Long term (current) use of insulin: Secondary | ICD-10-CM | POA: Insufficient documentation

## 2020-06-13 DIAGNOSIS — Z87891 Personal history of nicotine dependence: Secondary | ICD-10-CM | POA: Insufficient documentation

## 2020-06-13 DIAGNOSIS — E039 Hypothyroidism, unspecified: Secondary | ICD-10-CM | POA: Diagnosis not present

## 2020-06-13 DIAGNOSIS — Z79899 Other long term (current) drug therapy: Secondary | ICD-10-CM | POA: Diagnosis not present

## 2020-06-13 HISTORY — PX: ESOPHAGOGASTRODUODENOSCOPY (EGD) WITH PROPOFOL: SHX5813

## 2020-06-13 LAB — GLUCOSE, CAPILLARY: Glucose-Capillary: 118 mg/dL — ABNORMAL HIGH (ref 70–99)

## 2020-06-13 SURGERY — ESOPHAGOGASTRODUODENOSCOPY (EGD) WITH PROPOFOL
Anesthesia: General

## 2020-06-13 MED ORDER — PROPOFOL 10 MG/ML IV BOLUS
INTRAVENOUS | Status: AC
  Filled 2020-06-13: qty 40

## 2020-06-13 MED ORDER — LIDOCAINE VISCOUS HCL 2 % MT SOLN
15.0000 mL | Freq: Once | OROMUCOSAL | Status: AC
  Administered 2020-06-13: 15 mL via OROMUCOSAL

## 2020-06-13 MED ORDER — PROPOFOL 500 MG/50ML IV EMUL
INTRAVENOUS | Status: DC | PRN
  Administered 2020-06-13: 150 ug/kg/min via INTRAVENOUS

## 2020-06-13 MED ORDER — LACTATED RINGERS IV SOLN: INTRAVENOUS | Status: DC

## 2020-06-13 MED ORDER — PROPOFOL 10 MG/ML IV BOLUS
INTRAVENOUS | Status: DC | PRN
  Administered 2020-06-13: 50 mg via INTRAVENOUS

## 2020-06-13 MED ORDER — GLYCOPYRROLATE 0.2 MG/ML IJ SOLN
0.2000 mg | Freq: Once | INTRAMUSCULAR | Status: AC
  Administered 2020-06-13: 0.2 mg via INTRAVENOUS

## 2020-06-13 MED ORDER — GLYCOPYRROLATE 0.2 MG/ML IJ SOLN
INTRAMUSCULAR | Status: AC
  Filled 2020-06-13: qty 1

## 2020-06-13 MED ORDER — LIDOCAINE VISCOUS HCL 2 % MT SOLN
OROMUCOSAL | Status: AC
  Filled 2020-06-13: qty 15

## 2020-06-13 MED ORDER — STERILE WATER FOR IRRIGATION IR SOLN
Status: DC | PRN
  Administered 2020-06-13: 100 mL

## 2020-06-13 NOTE — Anesthesia Postprocedure Evaluation (Signed)
Anesthesia Post Note  Patient: [name] [name]  Procedure(s) Performed: ESOPHAGOGASTRODUODENOSCOPY (EGD) WITH PROPOFOL (N/A )  Patient location during evaluation: Phase II Anesthesia Type: General Level of consciousness: awake, oriented, awake and alert and patient cooperative Pain management: pain level controlled Vital Signs Assessment: post-procedure vital signs reviewed and stable Respiratory status: spontaneous breathing, respiratory function stable and nonlabored ventilation Cardiovascular status: stable Postop Assessment: no apparent nausea or vomiting Anesthetic complications: no   No complications documented.   Last Vitals:  Vitals:   06/13/20 0932 06/13/20 1047  BP: (!) 142/71   Pulse: 81   Resp: 12 (!) 22  Temp: 36.7 C 36.5 C  SpO2: 98% 94%    Last Pain:  Vitals:   06/13/20 1047  TempSrc: Oral  PainSc:                  Willa Rough

## 2020-06-13 NOTE — Addendum Note (Signed)
Addendum  created 06/13/20 1420 by Masson Nalepa M, CRNA   Charge Capture section accepted    

## 2020-06-13 NOTE — Discharge Instructions (Signed)
EGD Discharge instructions Please read the instructions outlined below and refer to this sheet in the next few weeks. These discharge instructions provide you with general information on caring for yourself after you leave the hospital. Your doctor may also give you specific instructions. While your treatment has been planned according to the most current medical practices available, unavoidable complications occasionally occur. If you have any problems or questions after discharge, please call your doctor. ACTIVITY  You may resume your regular activity but move at a slower pace for the next 24 hours.   Take frequent rest periods for the next 24 hours.   Walking will help expel (get rid of) the air and reduce the bloated feeling in your abdomen.   No driving for 24 hours (because of the anesthesia (medicine) used during the test).   You may shower.   Do not sign any important legal documents or operate any machinery for 24 hours (because of the anesthesia used during the test).  NUTRITION  Drink plenty of fluids.   You may resume your normal diet.   Begin with a light meal and progress to your normal diet.   Avoid alcoholic beverages for 24 hours or as instructed by your caregiver.  MEDICATIONS  You may resume your normal medications unless your caregiver tells you otherwise.  WHAT YOU CAN EXPECT TODAY  You may experience abdominal discomfort such as a feeling of fullness or "gas" pains.  FOLLOW-UP  Your doctor will discuss the results of your test with you.  SEEK IMMEDIATE MEDICAL ATTENTION IF ANY OF THE FOLLOWING OCCUR:  Excessive nausea (feeling sick to your stomach) and/or vomiting.   Severe abdominal pain and distention (swelling).   Trouble swallowing.   Temperature over 101 F (37.8 C).   Rectal bleeding or vomiting of blood.   Your upper GI tract appeared normal today.  I am glad you are feeling better.  Information on gastroparesis diet  provided Gastroparesis  Gastroparesis is a condition in which food takes longer than normal to empty from the stomach. This condition is also known as delayed gastric emptying. It is usually a long-term (chronic) condition. There is no cure, but there are treatments and things that you can do at home to help relieve symptoms. Treating the underlying condition that causes gastroparesis can also help relieve symptoms. What are the causes? In many cases, the cause of this condition is not known. Possible causes include:  A hormone (endocrine) disorder, such as hypothyroidism or diabetes.  A nervous system disease, such as Parkinson's disease or multiple sclerosis.  Cancer, infection, or surgery that affects the stomach or vagus nerve. The vagus nerve runs from your chest, through your neck, and to the lower part of your brain.  A connective tissue disorder, such as scleroderma.  Certain medicines. What increases the risk? You are more likely to develop this condition if:  You have certain disorders or diseases. These may include: ? An endocrine disorder. ? An eating disorder. ? Amyloidosis. ? Scleroderma. ? Parkinson's disease. ? Multiple sclerosis. ? Cancer or infection of the stomach or the vagus nerve.  You have had surgery on your stomach or vagus nerve.  You take certain medicines.  You are female. What are the signs or symptoms? Symptoms of this condition include:  Feeling full after eating very little or a loss of appetite.  Nausea, vomiting, or heartburn.  Bloating of your abdomen.  Inconsistent blood sugar (glucose) levels on blood tests.  Unexplained weight loss.  Acid from the stomach coming up into the esophagus (gastroesophageal reflux).  Sudden tightening (spasm) of the stomach, which can be painful. Symptoms may come and go. Some people may not notice any symptoms. How is this diagnosed? This condition is diagnosed with tests, such as:  Tests that  check how long it takes food to move through the stomach and intestines. These tests include: ? Upper gastrointestinal (GI) series. For this test, you drink a liquid that shows up well on X-rays, and then X-rays are taken of your intestines. ? Gastric emptying scintigraphy. For this test, you eat food that contains a small amount of radioactive material, and then scans are taken. ? Wireless capsule GI monitoring system. For this test, you swallow a pill (capsule) that records information about how foods and fluid move through your stomach.  Gastric manometry. For this test, a tube is passed down your throat and into your stomach to measure electrical and muscular activity.  Endoscopy. For this test, a long, thin tube with a camera and light on the end is passed down your throat and into your stomach to check for problems in your stomach lining.  Ultrasound. This test uses sound waves to create images of the inside of your body. This can help rule out gallbladder disease or pancreatitis as a cause of your symptoms. How is this treated? There is no cure for this condition, but treatment and home care may relieve symptoms. Treatment may include:  Treating the underlying cause.  Managing your symptoms by making changes to your diet and exercise habits.  Taking medicines to control nausea and vomiting and to stimulate stomach muscles.  Getting food through a feeding tube in the hospital. This may be done in severe cases.  Having surgery to insert a device called a gastric electrical stimulator into your body. This device helps improve stomach emptying and control nausea and vomiting. Follow these instructions at home:  Take over-the-counter and prescription medicines only as told by your health care provider.  Follow instructions from your health care provider about eating or drinking restrictions. Your health care provider may recommend that you: ? Eat smaller meals more often. ? Eat low-fat  foods. ? Eat low-fiber forms of high-fiber foods. For example, eat cooked vegetables instead of raw vegetables. ? Have only liquid foods instead of solid foods. Liquid foods are easier to digest.  Drink enough fluid to keep your urine pale yellow.  Exercise as often as told by your health care provider.  Keep all follow-up visits. This is important. Contact a health care provider if you:  Notice that your symptoms do not improve with treatment.  Have new symptoms. Get help right away if you:  Have severe pain in your abdomen that does not improve with treatment.  Have nausea that is severe or does not go away.  Vomit every time you drink fluids. Summary  Gastroparesis is a long-term (chronic) condition in which food takes longer than normal to empty from the stomach.  Symptoms include nausea, vomiting, heartburn, bloating of your abdomen, and loss of appetite.  Eating smaller portions, low-fat foods, and low-fiber forms of high-fiber foods may help you manage your symptoms.  Get help right away if you have severe pain in your abdomen. This information is not intended to replace advice given to you by your health care provider. Make sure you discuss any questions you have with your health care provider. Document Revised: 08/20/2019 Document Reviewed: 08/20/2019 Elsevier Patient Education  2021 Elsevier  Inc.   Continue Protonix; continue Reglan 2.5 milligrams twice daily  Phenergan as needed  Office visit with Roseanne Kaufman in 3 months if not already scheduled  If you develop recurrent nausea between now and your next office visit, please let us know.  At patient request, I called Legrand Como at [phone] -unable to reach.

## 2020-06-13 NOTE — Op Note (Signed)
[facility] Patient Name: [name] [name] Procedure Date: 06/13/2020 10:24 AM MRN: [id number] Date of Birth: [birth year] Attending MD: Norvel Richards , MD CSN: [id number] Age: 55 Admit Type: Outpatient Procedure:                Upper GI endoscopy Indications:              Nausea with vomiting -significantly improved since                            office visit. Providers:                Norvel Richards, MD, Rosina Lowenstein, RN, Nelma Rothman, Technician Referring MD:              Medicines:                Propofol per Anesthesia Complications:            No immediate complications. Estimated Blood Loss:     Estimated blood loss: none. Procedure:                Pre-Anesthesia Assessment:                           - Prior to the procedure, a History and Physical                            was performed, and patient medications and                            allergies were reviewed. The patient's tolerance of                            previous anesthesia was also reviewed. The risks                            and benefits of the procedure and the sedation                            options and risks were discussed with the patient.                            All questions were answered, and informed consent                            was obtained. Prior Anticoagulants: The patient has                            taken no previous anticoagulant or antiplatelet                            agents. ASA Grade Assessment: II - A patient with  mild systemic disease. After reviewing the risks                            and benefits, the patient was deemed in                            satisfactory condition to undergo the procedure.                           After obtaining informed consent, the endoscope was                            passed under direct vision. Throughout the                            procedure, the patient's blood  pressure, pulse, and                            oxygen saturations were monitored continuously. The                            [id number]-H190 ([id number]) scope was introduced through the                            mouth, and advanced to the third part of duodenum.                            The upper GI endoscopy was accomplished without                            difficulty. The patient tolerated the procedure                            well. Scope In: 10:40:29 AM Scope Out: 10:43:34 AM Total Procedure Duration: 0 hours 3 minutes 5 seconds  Findings:      The examined esophagus was normal.      The entire examined stomach was normal.      The duodenal bulb and second portion of the duodenum were normal. Impression:               - Normal esophagus.                           - Normal stomach.                           - Normal duodenal bulb and second portion of the                            duodenum.                           - No specimens collected. Moderate Sedation:      Moderate (conscious) sedation was personally administered by an       anesthesia professional. The following parameters were monitored: oxygen  saturation, heart rate, blood pressure, respiratory rate, EKG, adequacy       of pulmonary ventilation, and response to care. Recommendation:           - Patient has a contact number available for                            emergencies. The signs and symptoms of potential                            delayed complications were discussed with the                            patient. Return to normal activities tomorrow.                            Written discharge instructions were provided to the                            patient.                           - Resume previous diet.                           - Continue present medications.                           - Return to my office in 3 months. Procedure Code(s):        --- Professional ---                            [phone], Esophagogastroduodenoscopy, flexible,                            transoral; diagnostic, including collection of                            specimen(s) by brushing or washing, when performed                            (separate procedure) Diagnosis Code(s):        --- Professional ---                           R11.2, Nausea with vomiting, unspecified CPT copyright 2019 American Medical Association. All rights reserved. The codes documented in this report are preliminary and upon coder review may  be revised to meet current compliance requirements. Cristopher Estimable. Cyd Hostler, MD Norvel Richards, MD 06/13/2020 10:51:23 AM This report has been signed electronically. Number of Addenda: 0

## 2020-06-13 NOTE — Transfer of Care (Signed)
Immediate Anesthesia Transfer of Care Note  Patient: [name] [name]  Procedure(s) Performed: ESOPHAGOGASTRODUODENOSCOPY (EGD) WITH PROPOFOL (N/A )  Patient Location: PACU  Anesthesia Type:General  Level of Consciousness: awake, alert  and oriented  Airway & Oxygen Therapy: Patient Spontanous Breathing  Post-op Assessment: Report given to RN, Post -op Vital signs reviewed and stable and Patient moving all extremities X 4  Post vital signs: Reviewed and stable  Last Vitals:  Vitals Value Taken Time  BP    Temp    Pulse    Resp    SpO2      Last Pain:  Vitals:   06/13/20 1033  TempSrc:   PainSc: 0-No pain      Patients Stated Pain Goal: 9 (03/70/96 4383)  Complications: No complications documented.

## 2020-06-13 NOTE — Anesthesia Preprocedure Evaluation (Addendum)
Anesthesia Evaluation  Patient identified by MRN, date of birth, ID band Patient awake    Reviewed: Allergy & Precautions, NPO status , Patient's Chart, lab work & pertinent test results  History of Anesthesia Complications Negative for: history of anesthetic complications  Airway Mallampati: II  TM Distance: >3 FB Neck ROM: Full    Dental  (+) Dental Advisory Given, Teeth Intact, Caps   Pulmonary neg pulmonary ROS, former smoker,    Pulmonary exam normal breath sounds clear to auscultation       Cardiovascular Exercise Tolerance: Good hypertension, Pt. on medications Normal cardiovascular exam Rhythm:Regular Rate:Normal     Neuro/Psych negative neurological ROS  negative psych ROS   GI/Hepatic Neg liver ROS, GERD (gastroparesis )  Medicated and Controlled,  Endo/Other  diabetes, Well Controlled, Type 2, Oral Hypoglycemic AgentsHypothyroidism   Renal/GU Renal InsufficiencyRenal disease  negative genitourinary   Musculoskeletal negative musculoskeletal ROS (+)   Abdominal   Peds negative pediatric ROS (+)  Hematology  (+) anemia ,   Anesthesia Other Findings   Reproductive/Obstetrics negative OB ROS                           Anesthesia Physical Anesthesia Plan  ASA: II  Anesthesia Plan: General   Post-op Pain Management:    Induction: Intravenous  PONV Risk Score and Plan: TIVA  Airway Management Planned: Nasal Cannula and Natural Airway  Additional Equipment:   Intra-op Plan:   Post-operative Plan:   Informed Consent: I have reviewed the patients History and Physical, chart, labs and discussed the procedure including the risks, benefits and alternatives for the proposed anesthesia with the patient or authorized representative who has indicated his/her understanding and acceptance.     Dental advisory given  Plan Discussed with: CRNA and Surgeon  Anesthesia Plan  Comments:         Anesthesia Quick Evaluation

## 2020-06-13 NOTE — Interval H&P Note (Signed)
History and Physical Interval Note:  06/13/2020 10:28 AM  [name] [name]  has presented today for surgery, with the diagnosis of dyspepsia, nausea/vomiting.  The various methods of treatment have been discussed with the patient and family. After consideration of risks, benefits and other options for treatment, the patient has consented to  Procedure(s) with comments: ESOPHAGOGASTRODUODENOSCOPY (EGD) WITH PROPOFOL (N/A) - 11:00am as a surgical intervention.  The patient's history has been reviewed, patient examined, no change in status, stable for surgery.  I have reviewed the patient's chart and labs.  Questions were answered to the patient's satisfaction.     [name] [name]  No change.  Already feeling better.  No Dysphagia.  Diagnostic EGD per plan.  The risks, benefits, limitations, alternatives and imponderables have been reviewed with the patient. Potential for esophageal dilation, biopsy, etc. have also been reviewed.  Questions have been answered. All parties agreeable.

## 2020-06-15 ENCOUNTER — Other Ambulatory Visit: Payer: Self-pay | Admitting: Gastroenterology

## 2020-06-16 DIAGNOSIS — Z961 Presence of intraocular lens: Secondary | ICD-10-CM | POA: Diagnosis not present

## 2020-06-18 ENCOUNTER — Encounter (HOSPITAL_COMMUNITY): Payer: Self-pay | Admitting: Internal Medicine

## 2020-07-01 ENCOUNTER — Other Ambulatory Visit (HOSPITAL_COMMUNITY): Payer: Self-pay | Admitting: Family Medicine

## 2020-07-01 DIAGNOSIS — E039 Hypothyroidism, unspecified: Secondary | ICD-10-CM | POA: Diagnosis not present

## 2020-07-01 DIAGNOSIS — R053 Chronic cough: Secondary | ICD-10-CM

## 2020-07-01 DIAGNOSIS — E6609 Other obesity due to excess calories: Secondary | ICD-10-CM | POA: Diagnosis not present

## 2020-07-01 DIAGNOSIS — E1143 Type 2 diabetes mellitus with diabetic autonomic (poly)neuropathy: Secondary | ICD-10-CM | POA: Diagnosis not present

## 2020-07-03 ENCOUNTER — Other Ambulatory Visit (HOSPITAL_COMMUNITY)
Admission: RE | Admit: 2020-07-03 | Discharge: 2020-07-03 | Disposition: A | Discharge status: Home / Self Care | Payer: BC Managed Care – PPO | Source: Ambulatory Visit | Attending: Nephrology | Admitting: Nephrology

## 2020-07-03 ENCOUNTER — Other Ambulatory Visit: Payer: Self-pay

## 2020-07-03 DIAGNOSIS — I129 Hypertensive chronic kidney disease with stage 1 through stage 4 chronic kidney disease, or unspecified chronic kidney disease: Secondary | ICD-10-CM | POA: Diagnosis not present

## 2020-07-03 DIAGNOSIS — N189 Chronic kidney disease, unspecified: Secondary | ICD-10-CM | POA: Diagnosis not present

## 2020-07-03 DIAGNOSIS — E1122 Type 2 diabetes mellitus with diabetic chronic kidney disease: Secondary | ICD-10-CM | POA: Diagnosis not present

## 2020-07-03 DIAGNOSIS — D638 Anemia in other chronic diseases classified elsewhere: Secondary | ICD-10-CM | POA: Diagnosis not present

## 2020-07-03 DIAGNOSIS — E6609 Other obesity due to excess calories: Secondary | ICD-10-CM | POA: Diagnosis not present

## 2020-07-03 LAB — CBC
HCT: 36.3 % (ref 36.0–46.0)
Hemoglobin: 11 g/dL — ABNORMAL LOW (ref 12.0–15.0)
MCH: 28.4 pg (ref 26.0–34.0)
MCHC: 30.3 g/dL (ref 30.0–36.0)
MCV: 93.8 fL (ref 80.0–100.0)
Platelets: 318 10*3/uL (ref 150–400)
RBC: 3.87 MIL/uL (ref 3.87–5.11)
RDW: 14.5 % (ref 11.5–15.5)
WBC: 6.9 10*3/uL (ref 4.0–10.5)
nRBC: 0 % (ref 0.0–0.2)

## 2020-07-03 LAB — RENAL FUNCTION PANEL
Albumin: 3.9 g/dL (ref 3.5–5.0)
Anion gap: 8 (ref 5–15)
BUN: 13 mg/dL (ref 6–20)
CO2: 25 mmol/L (ref 22–32)
Calcium: 9.3 mg/dL (ref 8.9–10.3)
Chloride: 105 mmol/L (ref 98–111)
Creatinine, Ser: 1.63 mg/dL — ABNORMAL HIGH (ref 0.44–1.00)
GFR, Estimated: 37 mL/min — ABNORMAL LOW (ref >60)
Glucose, Bld: 141 mg/dL — ABNORMAL HIGH (ref 70–99)
Phosphorus: 2.8 mg/dL (ref 2.5–4.6)
Potassium: 4.8 mmol/L (ref 3.5–5.1)
Sodium: 138 mmol/L (ref 135–145)

## 2020-07-04 LAB — MICROALBUMIN / CREATININE URINE RATIO
Creatinine, Urine: 41.8 mg/dL
Microalb Creat Ratio: 8 mg/g creat (ref 0–29)
Microalb, Ur: 3.3 ug/mL — ABNORMAL HIGH

## 2020-07-10 ENCOUNTER — Ambulatory Visit (HOSPITAL_COMMUNITY)
Admission: RE | Admit: 2020-07-10 | Discharge: 2020-07-10 | Disposition: A | Discharge status: Home / Self Care | Payer: BC Managed Care – PPO | Source: Ambulatory Visit | Attending: Family Medicine | Admitting: Family Medicine

## 2020-07-10 ENCOUNTER — Other Ambulatory Visit: Payer: Self-pay

## 2020-07-10 DIAGNOSIS — R059 Cough, unspecified: Secondary | ICD-10-CM | POA: Diagnosis not present

## 2020-07-10 DIAGNOSIS — R053 Chronic cough: Secondary | ICD-10-CM | POA: Diagnosis not present

## 2020-07-14 DIAGNOSIS — L218 Other seborrheic dermatitis: Secondary | ICD-10-CM | POA: Diagnosis not present

## 2020-07-14 DIAGNOSIS — L668 Other cicatricial alopecia: Secondary | ICD-10-CM | POA: Diagnosis not present

## 2020-07-29 NOTE — Progress Notes (Signed)
Referring Provider: Lemmie Evens, MD Primary Care Physician:  Lemmie Evens, MD Primary GI Physician: Dr. Gala Romney  Chief Complaint  Patient presents with  . gastroparesis    Doing ok. Needs phenergan refill    HPI:   [name] [name] is a 55 y.o. female with a history of GERD, gastroparesis, fatty liver presenting today for follow-up s/p EGD for worsening nausea and epigastric pain.  Last seen in our office 05/15/2020 and reported persistent nausea worse from baseline and mild epigastric discomfort.  Previously, she did well with once daily PPI and Zofran as needed, but Zofran longer helpful.  Also, previously responded to low-dose Reglan 2.5 mg twice daily, but this also is no longer effective.  Reported vague epigastric discomfort but denied NSAIDs.  Recommended increasing PPI to twice daily, Phenergan 12.5 mg as needed, increase Reglan to 5 mg twice daily, and proceed with EGD.  EGD 06/13/2020: Entirely normal exam.  Notably, her symptoms had significantly improved since office visit.  Today: The pain and nausea have resolved.  GERD remains well controlled.  No dysphagia, BRBPR, melena.  She is taking Phenergan nightly, Reglan 5 mg twice daily.  She did run out of pantoprazole last week.  She has made some dietary changes and is limiting fried, fatty, greasy foods as well as soda.  She does eat quite a bit of salad.  With dietary changes, she has lost about 2 pounds over the last 2.5 months.  Bowels are moving well.  Denies constipation or diarrhea.  Past Medical History:  Diagnosis Date  . Diabetes ([place])   . Gastroparesis   . Grave's disease    diagnosed years ago/ now hypothyroidism  . Hypertension   . IBS (irritable colon syndrome)    constipation predominant    Past Surgical History:  Procedure Laterality Date  . ABDOMINAL HYSTERECTOMY    . attempted colonoscopy  03/2004   Dr. Rourk--> prep was poor. Exam to 40 cm. No gross abnormalities. Internal hemorrhoids. Air  contrast barium enema was normal.  . CHOLECYSTECTOMY    . COLONOSCOPY N/A 10/30/2014   RMR: normal. next colonoscopy in 2026.  Marland Kitchen ESOPHAGEAL DILATION N/A 10/30/2014   Procedure: ESOPHAGEAL DILATION;  Surgeon: Daneil Dolin, MD;  Location: [facility];  Service: Endoscopy;  Laterality: N/A;  . ESOPHAGOGASTRODUODENOSCOPY  01/2004   Dr. Almyra Free small polyps in postbulbar area ablated  . ESOPHAGOGASTRODUODENOSCOPY N/A 10/30/2014   [id number]  . ESOPHAGOGASTRODUODENOSCOPY (EGD) WITH PROPOFOL N/A 06/13/2020    Surgeon: Daneil Dolin, MD;  Normal exam  . S/P Hysterectomy     . TONSILLECTOMY    . TUBAL LIGATION      Current Outpatient Medications  Medication Sig Dispense Refill  . acetaminophen (TYLENOL) 500 MG tablet Take 1,000 mg by mouth every 6 (six) hours as needed for mild pain, moderate pain or headache.    Marland Kitchen ammonium lactate (LAC-HYDRIN) 12 % lotion Apply 1 application topically daily.    Marland Kitchen atorvastatin (LIPITOR) 20 MG tablet Take 20 mg by mouth daily.    . blood glucose meter kit and supplies Use to check your blood sugar three times a day as directed. (FOR ICD-10 E10.9, E11.9). 1 each 0  . cetirizine (ZYRTEC) 10 MG tablet Take 10 mg by mouth daily.    . fluticasone (FLONASE) 50 MCG/ACT nasal spray Place 1 spray into both nostrils daily as needed for rhinitis.    Marland Kitchen insulin glargine (LANTUS SOLOSTAR) 100 UNIT/ML Solostar Pen Inject 10 Units into  the skin at bedtime. (Patient taking differently: Inject 2 Units into the skin daily as needed (blood glucos is over 150).) 15 mL 1  . levothyroxine (SYNTHROID) 88 MCG tablet Take 88 mcg by mouth daily.    Marland Kitchen losartan (COZAAR) 25 MG tablet Take 1 tablet (25 mg total) by mouth daily. 30 tablet 3  . metoCLOPramide (REGLAN) 5 MG tablet TAKE 1/2 (ONE-HALF) TABLET BY MOUTH TWICE DAILY AS DIRECTED (Patient taking differently: Take 5 mg by mouth daily.) 30 tablet 3  . metoprolol succinate (TOPROL-XL) 25 MG 24 hr tablet Take 25 mg by mouth daily.    .  polyethylene glycol powder (GLYCOLAX/MIRALAX) 17 GM/SCOOP powder Take 0.5 Containers by mouth daily.    . ondansetron (ZOFRAN-ODT) 4 MG disintegrating tablet Take 1 tablet (4 mg total) by mouth every 8 (eight) hours as needed for nausea or vomiting. 20 tablet 2  . pantoprazole (PROTONIX) 40 MG tablet Take 1 tablet (40 mg total) by mouth daily before breakfast. 90 tablet 3  . promethazine (PHENERGAN) 12.5 MG tablet Take 1 tablet (12.5 mg total) by mouth every 6 (six) hours as needed for nausea or vomiting. 20 tablet 2   No current facility-administered medications for this visit.    Allergies as of 07/30/2020 - Review Complete 07/30/2020  Allergen Reaction Noted  . Codeine Nausea And Vomiting     Family History  Problem Relation Age of Onset  . Other Father 53       hit by train  . Heart disease Mother   . Kidney disease Mother   . Colon cancer Neg Hx     Social History   Socioeconomic History  . Marital status: Single    Spouse name: Not on file  . Number of children: 1  . Years of education: Not on file  . Highest education level: Not on file  Occupational History  . Occupation: unemployed since July 3    Employer: [employer]  Tobacco Use  . Smoking status: Former Smoker    Types: E-cigarettes  . Smokeless tobacco: Never Used  Vaping Use  . Vaping Use: Never used  Substance and Sexual Activity  . Alcohol use: Not Currently    Comment: occ  . Drug use: No  . Sexual activity: Yes    Birth control/protection: Surgical    Comment: hyst  Other Topics Concern  . Not on file  Social History Narrative   1 grown healthy daughter   Social Determinants of Health   Financial Resource Strain: Not on file  Food Insecurity: Not on file  Transportation Needs: Not on file  Physical Activity: Not on file  Stress: Not on file  Social Connections: Not on file    Review of Systems: Gen: Denies fever, chills, cold symptoms, presyncope, syncope. CV: Denies chest  pain or palpitations. Resp: Denies dyspnea or cough. GI: See HPI Heme: See HPI  Physical Exam: BP 133/79   Pulse 87   Temp (!) 97.1 F (36.2 C) (Temporal)   Ht $R'5\' 1"'LH$  (1.549 m)   Wt 196 lb (88.9 kg)   BMI 37.03 kg/m  General:   Alert and oriented. No distress noted. Pleasant and cooperative.  Head:  Normocephalic and atraumatic. Eyes:  Conjuctiva clear without scleral icterus. Heart:  S1, S2 present without murmurs appreciated. Lungs:  Clear to auscultation bilaterally. No wheezes, rales, or rhonchi. No distress.  Abdomen:  +BS, soft, non-tender and non-distended. No rebound or guarding. No HSM or masses noted. Msk:  Symmetrical  without gross deformities. Normal posture. Extremities:  Without edema. Neurologic:  Alert and  oriented x4 Psych: Normal mood and affect.   Assessment: 55 year old female with history of GERD, gastroparesis, and fatty liver presenting today for follow-up.  She experienced worsening epigastric pain and nausea in January 2022, likely gastroparesis flare.  Symptoms significantly improved/resolved with increasing Reglan to 5 mg twice daily and starting Phenergan nightly.  EGD was also pursued in February which was entirely normal.  She has made some dietary changes and is limiting fried, fatty, greasy foods and soda.  GERD remains well controlled.  No alarm symptoms. She is not having any side effects to Reglan.  We discussed gradually tapering her back down to Reglan 2.5 mg twice daily which had previously kept her symptoms well controlled.  She is interested in trying to do this.  She was also advised that she does not need to take Phenergan routinely but rather on an as-needed basis.  Plan: 1.  Reduce Reglan to 2.5 mg in the morning before breakfast and 5 mg in the evening before dinner.  After 4 weeks, if symptoms remain well controlled, reduce Reglan to 2.5 mg in the morning before breakfast and 2.5 mg in the evening before dinner.  Counseled on side  effects to Reglan.   2.  Use Zofran or Phenergan as needed for nausea. Advised not to take phenergan if she will be driving.  3.  Continue Protonix 40 mg daily.  4.  Counseled extensively on gastroparesis diet and lifestyle.  Separate handout was provided.  5.  Follow-up in 6 months.  Requested she let us know if she has return of epigastric pain or nausea with reducing Reglan.    Aliene Altes, PA-C [facility] 07/30/2020

## 2020-07-30 ENCOUNTER — Encounter: Payer: Self-pay | Admitting: Gastroenterology

## 2020-07-30 ENCOUNTER — Other Ambulatory Visit: Payer: Self-pay

## 2020-07-30 ENCOUNTER — Ambulatory Visit: Payer: BC Managed Care – PPO | Admitting: Gastroenterology

## 2020-07-30 VITALS — BP 133/79 | HR 87 | Temp 97.1°F | Ht 61.0 in | Wt 196.0 lb

## 2020-07-30 DIAGNOSIS — K219 Gastro-esophageal reflux disease without esophagitis: Secondary | ICD-10-CM

## 2020-07-30 DIAGNOSIS — K3184 Gastroparesis: Secondary | ICD-10-CM

## 2020-07-30 MED ORDER — PROMETHAZINE HCL 12.5 MG PO TABS: 12.5000 mg | ORAL_TABLET | Freq: Four times a day (QID) | ORAL | 2 refills | Status: DC | PRN

## 2020-07-30 MED ORDER — ONDANSETRON 4 MG PO TBDP
4.0000 mg | ORAL_TABLET | Freq: Three times a day (TID) | ORAL | 2 refills | Status: DC | PRN
Start: 2020-07-30 — End: 2020-10-16

## 2020-07-30 MED ORDER — PANTOPRAZOLE SODIUM 40 MG PO TBEC: 1.0000 | DELAYED_RELEASE_TABLET | Freq: Every day | ORAL | 3 refills | Status: DC

## 2020-07-30 NOTE — Patient Instructions (Signed)
As we discussed, over the next several weeks, we will try reducing your dose of Reglan and see how you do.  Start taking 2.5 mg of Reglan in the morning before breakfast and 5 mg in the evening before dinner.   After 4 weeks, if you continues to do well, reduce to 2.5 mg of Reglan in the morning before breakfast and 2.5 mg in the evening before dinner.  You may continue to use Zofran or Phenergan as needed for nausea.  No need to continue to take Phenergan every night at this time.  Resume taking Protonix 40 mg daily 30 minutes before breakfast.  I have sent a refill to your pharmacy.  Please see separate gastroparesis diet handout. In general, I recommend eating 4-6 small meals daily and avoiding fried, fatty, greasy foods.  You should also try to limit high-fiber foods as this will also delayed gastric emptying.  Avoid uncooked vegetables.  We will plan to see back in 6 months. Please let me know if you start having trouble with reducing Reglan.   It was great meeting you today!  Aliene Altes, PA-C [facility]

## 2020-08-01 DIAGNOSIS — E1143 Type 2 diabetes mellitus with diabetic autonomic (poly)neuropathy: Secondary | ICD-10-CM | POA: Diagnosis not present

## 2020-08-01 DIAGNOSIS — E6609 Other obesity due to excess calories: Secondary | ICD-10-CM | POA: Diagnosis not present

## 2020-08-01 DIAGNOSIS — R5383 Other fatigue: Secondary | ICD-10-CM | POA: Diagnosis not present

## 2020-08-01 DIAGNOSIS — I10 Essential (primary) hypertension: Secondary | ICD-10-CM | POA: Diagnosis not present

## 2020-08-29 DIAGNOSIS — E039 Hypothyroidism, unspecified: Secondary | ICD-10-CM | POA: Diagnosis not present

## 2020-08-29 DIAGNOSIS — E6609 Other obesity due to excess calories: Secondary | ICD-10-CM | POA: Diagnosis not present

## 2020-08-29 DIAGNOSIS — N189 Chronic kidney disease, unspecified: Secondary | ICD-10-CM | POA: Diagnosis not present

## 2020-08-29 DIAGNOSIS — E1143 Type 2 diabetes mellitus with diabetic autonomic (poly)neuropathy: Secondary | ICD-10-CM | POA: Diagnosis not present

## 2020-09-02 DIAGNOSIS — E1165 Type 2 diabetes mellitus with hyperglycemia: Secondary | ICD-10-CM | POA: Diagnosis not present

## 2020-09-02 DIAGNOSIS — I10 Essential (primary) hypertension: Secondary | ICD-10-CM | POA: Diagnosis not present

## 2020-09-02 DIAGNOSIS — E782 Mixed hyperlipidemia: Secondary | ICD-10-CM | POA: Diagnosis not present

## 2020-09-02 DIAGNOSIS — E039 Hypothyroidism, unspecified: Secondary | ICD-10-CM | POA: Diagnosis not present

## 2020-09-10 DIAGNOSIS — E569 Vitamin deficiency, unspecified: Secondary | ICD-10-CM | POA: Diagnosis not present

## 2020-09-10 DIAGNOSIS — E782 Mixed hyperlipidemia: Secondary | ICD-10-CM | POA: Diagnosis not present

## 2020-09-10 DIAGNOSIS — E1165 Type 2 diabetes mellitus with hyperglycemia: Secondary | ICD-10-CM | POA: Diagnosis not present

## 2020-09-10 DIAGNOSIS — I10 Essential (primary) hypertension: Secondary | ICD-10-CM | POA: Diagnosis not present

## 2020-09-17 DIAGNOSIS — I10 Essential (primary) hypertension: Secondary | ICD-10-CM | POA: Diagnosis not present

## 2020-09-17 DIAGNOSIS — E1165 Type 2 diabetes mellitus with hyperglycemia: Secondary | ICD-10-CM | POA: Diagnosis not present

## 2020-09-17 DIAGNOSIS — E669 Obesity, unspecified: Secondary | ICD-10-CM | POA: Diagnosis not present

## 2020-09-17 DIAGNOSIS — E782 Mixed hyperlipidemia: Secondary | ICD-10-CM | POA: Diagnosis not present

## 2020-09-29 DIAGNOSIS — L218 Other seborrheic dermatitis: Secondary | ICD-10-CM | POA: Diagnosis not present

## 2020-09-29 DIAGNOSIS — L668 Other cicatricial alopecia: Secondary | ICD-10-CM | POA: Diagnosis not present

## 2020-10-13 ENCOUNTER — Other Ambulatory Visit: Payer: Self-pay | Admitting: Gastroenterology

## 2020-10-13 DIAGNOSIS — K3184 Gastroparesis: Secondary | ICD-10-CM

## 2020-10-20 ENCOUNTER — Other Ambulatory Visit: Payer: Self-pay | Admitting: Gastroenterology

## 2020-10-20 DIAGNOSIS — K3184 Gastroparesis: Secondary | ICD-10-CM

## 2020-10-20 DIAGNOSIS — E669 Obesity, unspecified: Secondary | ICD-10-CM | POA: Diagnosis not present

## 2020-11-04 ENCOUNTER — Other Ambulatory Visit (HOSPITAL_COMMUNITY)
Admission: RE | Admit: 2020-11-04 | Discharge: 2020-11-04 | Disposition: A | Discharge status: Home / Self Care | Payer: BC Managed Care – PPO | Source: Ambulatory Visit | Attending: Nephrology | Admitting: Nephrology

## 2020-11-04 ENCOUNTER — Other Ambulatory Visit: Payer: Self-pay

## 2020-11-04 DIAGNOSIS — D638 Anemia in other chronic diseases classified elsewhere: Secondary | ICD-10-CM | POA: Diagnosis not present

## 2020-11-04 DIAGNOSIS — E6609 Other obesity due to excess calories: Secondary | ICD-10-CM | POA: Insufficient documentation

## 2020-11-04 DIAGNOSIS — E1122 Type 2 diabetes mellitus with diabetic chronic kidney disease: Secondary | ICD-10-CM | POA: Diagnosis not present

## 2020-11-04 DIAGNOSIS — N189 Chronic kidney disease, unspecified: Secondary | ICD-10-CM | POA: Diagnosis not present

## 2020-11-04 DIAGNOSIS — I129 Hypertensive chronic kidney disease with stage 1 through stage 4 chronic kidney disease, or unspecified chronic kidney disease: Secondary | ICD-10-CM | POA: Insufficient documentation

## 2020-11-04 LAB — RENAL FUNCTION PANEL
Albumin: 3.8 g/dL (ref 3.5–5.0)
Anion gap: 7 (ref 5–15)
BUN: 18 mg/dL (ref 6–20)
CO2: 26 mmol/L (ref 22–32)
Calcium: 9.8 mg/dL (ref 8.9–10.3)
Chloride: 105 mmol/L (ref 98–111)
Creatinine, Ser: 2 mg/dL — ABNORMAL HIGH (ref 0.44–1.00)
GFR, Estimated: 29 mL/min — ABNORMAL LOW (ref >60)
Glucose, Bld: 109 mg/dL — ABNORMAL HIGH (ref 70–99)
Phosphorus: 3.8 mg/dL (ref 2.5–4.6)
Potassium: 4.5 mmol/L (ref 3.5–5.1)
Sodium: 138 mmol/L (ref 135–145)

## 2020-11-04 LAB — CBC
HCT: 37.4 % (ref 36.0–46.0)
Hemoglobin: 11.3 g/dL — ABNORMAL LOW (ref 12.0–15.0)
MCH: 27.8 pg (ref 26.0–34.0)
MCHC: 30.2 g/dL (ref 30.0–36.0)
MCV: 91.9 fL (ref 80.0–100.0)
Platelets: 368 10*3/uL (ref 150–400)
RBC: 4.07 MIL/uL (ref 3.87–5.11)
RDW: 13.9 % (ref 11.5–15.5)
WBC: 7.5 10*3/uL (ref 4.0–10.5)
nRBC: 0 % (ref 0.0–0.2)

## 2020-11-05 DIAGNOSIS — N189 Chronic kidney disease, unspecified: Secondary | ICD-10-CM | POA: Diagnosis not present

## 2020-11-05 DIAGNOSIS — D638 Anemia in other chronic diseases classified elsewhere: Secondary | ICD-10-CM | POA: Diagnosis not present

## 2020-11-05 DIAGNOSIS — E6609 Other obesity due to excess calories: Secondary | ICD-10-CM | POA: Diagnosis not present

## 2020-11-05 DIAGNOSIS — I129 Hypertensive chronic kidney disease with stage 1 through stage 4 chronic kidney disease, or unspecified chronic kidney disease: Secondary | ICD-10-CM | POA: Diagnosis not present

## 2020-11-05 LAB — MICROALBUMIN / CREATININE URINE RATIO
Creatinine, Urine: 166.7 mg/dL
Microalb Creat Ratio: 16 mg/g creat (ref 0–29)
Microalb, Ur: 26.5 ug/mL — ABNORMAL HIGH

## 2020-11-09 ENCOUNTER — Other Ambulatory Visit: Payer: Self-pay | Admitting: Gastroenterology

## 2020-11-09 DIAGNOSIS — K3184 Gastroparesis: Secondary | ICD-10-CM

## 2020-11-20 ENCOUNTER — Other Ambulatory Visit: Payer: Self-pay | Admitting: Gastroenterology

## 2020-11-20 DIAGNOSIS — K3184 Gastroparesis: Secondary | ICD-10-CM

## 2020-12-02 DIAGNOSIS — R5383 Other fatigue: Secondary | ICD-10-CM | POA: Diagnosis not present

## 2020-12-02 DIAGNOSIS — E669 Obesity, unspecified: Secondary | ICD-10-CM | POA: Diagnosis not present

## 2020-12-09 ENCOUNTER — Other Ambulatory Visit (HOSPITAL_COMMUNITY)
Admission: RE | Admit: 2020-12-09 | Discharge: 2020-12-09 | Disposition: A | Discharge status: Home / Self Care | Payer: BC Managed Care – PPO | Source: Ambulatory Visit | Attending: Nephrology | Admitting: Nephrology

## 2020-12-09 DIAGNOSIS — N189 Chronic kidney disease, unspecified: Secondary | ICD-10-CM | POA: Insufficient documentation

## 2020-12-09 DIAGNOSIS — D638 Anemia in other chronic diseases classified elsewhere: Secondary | ICD-10-CM | POA: Diagnosis not present

## 2020-12-09 DIAGNOSIS — E1122 Type 2 diabetes mellitus with diabetic chronic kidney disease: Secondary | ICD-10-CM | POA: Insufficient documentation

## 2020-12-09 DIAGNOSIS — I129 Hypertensive chronic kidney disease with stage 1 through stage 4 chronic kidney disease, or unspecified chronic kidney disease: Secondary | ICD-10-CM | POA: Insufficient documentation

## 2020-12-09 DIAGNOSIS — N170 Acute kidney failure with tubular necrosis: Secondary | ICD-10-CM | POA: Insufficient documentation

## 2020-12-09 DIAGNOSIS — E6609 Other obesity due to excess calories: Secondary | ICD-10-CM | POA: Insufficient documentation

## 2020-12-09 LAB — RENAL FUNCTION PANEL
Albumin: 3.5 g/dL (ref 3.5–5.0)
Anion gap: 5 (ref 5–15)
BUN: 21 mg/dL — ABNORMAL HIGH (ref 6–20)
CO2: 25 mmol/L (ref 22–32)
Calcium: 9.1 mg/dL (ref 8.9–10.3)
Chloride: 109 mmol/L (ref 98–111)
Creatinine, Ser: 2.32 mg/dL — ABNORMAL HIGH (ref 0.44–1.00)
GFR, Estimated: 24 mL/min — ABNORMAL LOW (ref >60)
Glucose, Bld: 109 mg/dL — ABNORMAL HIGH (ref 70–99)
Phosphorus: 3.3 mg/dL (ref 2.5–4.6)
Potassium: 4.4 mmol/L (ref 3.5–5.1)
Sodium: 139 mmol/L (ref 135–145)

## 2020-12-10 ENCOUNTER — Other Ambulatory Visit (HOSPITAL_COMMUNITY): Payer: Self-pay | Admitting: Nephrology

## 2020-12-10 ENCOUNTER — Other Ambulatory Visit: Payer: Self-pay | Admitting: Nephrology

## 2020-12-10 DIAGNOSIS — N189 Chronic kidney disease, unspecified: Secondary | ICD-10-CM | POA: Diagnosis not present

## 2020-12-10 DIAGNOSIS — I129 Hypertensive chronic kidney disease with stage 1 through stage 4 chronic kidney disease, or unspecified chronic kidney disease: Secondary | ICD-10-CM | POA: Diagnosis not present

## 2020-12-10 DIAGNOSIS — N170 Acute kidney failure with tubular necrosis: Secondary | ICD-10-CM

## 2020-12-10 DIAGNOSIS — D638 Anemia in other chronic diseases classified elsewhere: Secondary | ICD-10-CM | POA: Diagnosis not present

## 2020-12-10 DIAGNOSIS — E1122 Type 2 diabetes mellitus with diabetic chronic kidney disease: Secondary | ICD-10-CM

## 2020-12-19 ENCOUNTER — Other Ambulatory Visit: Payer: Self-pay

## 2020-12-19 ENCOUNTER — Ambulatory Visit (HOSPITAL_COMMUNITY)
Admission: RE | Admit: 2020-12-19 | Discharge: 2020-12-19 | Disposition: A | Discharge status: Home / Self Care | Payer: BC Managed Care – PPO | Source: Ambulatory Visit | Attending: Nephrology | Admitting: Nephrology

## 2020-12-19 DIAGNOSIS — D638 Anemia in other chronic diseases classified elsewhere: Secondary | ICD-10-CM | POA: Diagnosis not present

## 2020-12-19 DIAGNOSIS — E1122 Type 2 diabetes mellitus with diabetic chronic kidney disease: Secondary | ICD-10-CM | POA: Diagnosis not present

## 2020-12-19 DIAGNOSIS — N189 Chronic kidney disease, unspecified: Secondary | ICD-10-CM | POA: Diagnosis not present

## 2020-12-19 DIAGNOSIS — I129 Hypertensive chronic kidney disease with stage 1 through stage 4 chronic kidney disease, or unspecified chronic kidney disease: Secondary | ICD-10-CM | POA: Insufficient documentation

## 2020-12-19 DIAGNOSIS — N170 Acute kidney failure with tubular necrosis: Secondary | ICD-10-CM | POA: Diagnosis not present

## 2020-12-22 ENCOUNTER — Other Ambulatory Visit (HOSPITAL_COMMUNITY)
Admission: RE | Admit: 2020-12-22 | Discharge: 2020-12-22 | Disposition: A | Discharge status: Home / Self Care | Payer: BC Managed Care – PPO | Source: Ambulatory Visit | Attending: Nephrology | Admitting: Nephrology

## 2020-12-22 DIAGNOSIS — E1122 Type 2 diabetes mellitus with diabetic chronic kidney disease: Secondary | ICD-10-CM | POA: Diagnosis not present

## 2020-12-22 DIAGNOSIS — N189 Chronic kidney disease, unspecified: Secondary | ICD-10-CM | POA: Diagnosis not present

## 2020-12-22 DIAGNOSIS — E638 Other specified nutritional deficiencies: Secondary | ICD-10-CM | POA: Insufficient documentation

## 2020-12-22 DIAGNOSIS — N170 Acute kidney failure with tubular necrosis: Secondary | ICD-10-CM | POA: Insufficient documentation

## 2020-12-22 DIAGNOSIS — I129 Hypertensive chronic kidney disease with stage 1 through stage 4 chronic kidney disease, or unspecified chronic kidney disease: Secondary | ICD-10-CM | POA: Insufficient documentation

## 2020-12-22 LAB — PROTEIN / CREATININE RATIO, URINE
Creatinine, Urine: 208.53 mg/dL
Protein Creatinine Ratio: 0.18 mg/mg{Cre} — ABNORMAL HIGH (ref 0.00–0.15)
Total Protein, Urine: 38 mg/dL

## 2020-12-22 LAB — RENAL FUNCTION PANEL
Albumin: 3.6 g/dL (ref 3.5–5.0)
Anion gap: 6 (ref 5–15)
BUN: 19 mg/dL (ref 6–20)
CO2: 24 mmol/L (ref 22–32)
Calcium: 8.9 mg/dL (ref 8.9–10.3)
Chloride: 107 mmol/L (ref 98–111)
Creatinine, Ser: 2.82 mg/dL — ABNORMAL HIGH (ref 0.44–1.00)
GFR, Estimated: 19 mL/min — ABNORMAL LOW (ref >60)
Glucose, Bld: 117 mg/dL — ABNORMAL HIGH (ref 70–99)
Phosphorus: 3.1 mg/dL (ref 2.5–4.6)
Potassium: 4.1 mmol/L (ref 3.5–5.1)
Sodium: 137 mmol/L (ref 135–145)

## 2020-12-22 LAB — CBC
HCT: 32.1 % — ABNORMAL LOW (ref 36.0–46.0)
Hemoglobin: 9.6 g/dL — ABNORMAL LOW (ref 12.0–15.0)
MCH: 27.7 pg (ref 26.0–34.0)
MCHC: 29.9 g/dL — ABNORMAL LOW (ref 30.0–36.0)
MCV: 92.8 fL (ref 80.0–100.0)
Platelets: 348 10*3/uL (ref 150–400)
RBC: 3.46 MIL/uL — ABNORMAL LOW (ref 3.87–5.11)
RDW: 13.7 % (ref 11.5–15.5)
WBC: 7.5 10*3/uL (ref 4.0–10.5)
nRBC: 0 % (ref 0.0–0.2)

## 2020-12-25 DIAGNOSIS — N189 Chronic kidney disease, unspecified: Secondary | ICD-10-CM | POA: Diagnosis not present

## 2020-12-25 DIAGNOSIS — R809 Proteinuria, unspecified: Secondary | ICD-10-CM | POA: Diagnosis not present

## 2020-12-25 DIAGNOSIS — N170 Acute kidney failure with tubular necrosis: Secondary | ICD-10-CM | POA: Diagnosis not present

## 2020-12-25 DIAGNOSIS — D638 Anemia in other chronic diseases classified elsewhere: Secondary | ICD-10-CM | POA: Diagnosis not present

## 2020-12-30 ENCOUNTER — Other Ambulatory Visit (HOSPITAL_COMMUNITY): Payer: Self-pay | Admitting: Nephrology

## 2020-12-30 DIAGNOSIS — D638 Anemia in other chronic diseases classified elsewhere: Secondary | ICD-10-CM

## 2020-12-30 DIAGNOSIS — R809 Proteinuria, unspecified: Secondary | ICD-10-CM

## 2020-12-30 DIAGNOSIS — E1122 Type 2 diabetes mellitus with diabetic chronic kidney disease: Secondary | ICD-10-CM

## 2020-12-30 DIAGNOSIS — E1129 Type 2 diabetes mellitus with other diabetic kidney complication: Secondary | ICD-10-CM

## 2020-12-30 DIAGNOSIS — N170 Acute kidney failure with tubular necrosis: Secondary | ICD-10-CM

## 2021-01-13 ENCOUNTER — Other Ambulatory Visit (HOSPITAL_COMMUNITY): Payer: Self-pay | Admitting: Physician Assistant

## 2021-01-13 ENCOUNTER — Other Ambulatory Visit: Payer: Self-pay | Admitting: Radiology

## 2021-01-14 ENCOUNTER — Ambulatory Visit (HOSPITAL_COMMUNITY)
Admission: RE | Admit: 2021-01-14 | Discharge: 2021-01-14 | Disposition: A | Discharge status: Home / Self Care | Payer: BC Managed Care – PPO | Source: Ambulatory Visit | Attending: Nephrology | Admitting: Nephrology

## 2021-01-14 ENCOUNTER — Encounter (HOSPITAL_COMMUNITY): Payer: Self-pay

## 2021-01-14 ENCOUNTER — Other Ambulatory Visit: Payer: Self-pay

## 2021-01-14 DIAGNOSIS — N170 Acute kidney failure with tubular necrosis: Secondary | ICD-10-CM | POA: Diagnosis not present

## 2021-01-14 DIAGNOSIS — Z9049 Acquired absence of other specified parts of digestive tract: Secondary | ICD-10-CM | POA: Insufficient documentation

## 2021-01-14 DIAGNOSIS — N119 Chronic tubulo-interstitial nephritis, unspecified: Secondary | ICD-10-CM | POA: Diagnosis not present

## 2021-01-14 DIAGNOSIS — N189 Chronic kidney disease, unspecified: Secondary | ICD-10-CM | POA: Diagnosis not present

## 2021-01-14 DIAGNOSIS — R809 Proteinuria, unspecified: Secondary | ICD-10-CM | POA: Insufficient documentation

## 2021-01-14 DIAGNOSIS — E1129 Type 2 diabetes mellitus with other diabetic kidney complication: Secondary | ICD-10-CM

## 2021-01-14 DIAGNOSIS — D631 Anemia in chronic kidney disease: Secondary | ICD-10-CM | POA: Diagnosis not present

## 2021-01-14 DIAGNOSIS — Z79890 Hormone replacement therapy (postmenopausal): Secondary | ICD-10-CM | POA: Insufficient documentation

## 2021-01-14 DIAGNOSIS — E1122 Type 2 diabetes mellitus with diabetic chronic kidney disease: Secondary | ICD-10-CM | POA: Diagnosis not present

## 2021-01-14 DIAGNOSIS — D638 Anemia in other chronic diseases classified elsewhere: Secondary | ICD-10-CM | POA: Diagnosis not present

## 2021-01-14 DIAGNOSIS — N051 Unspecified nephritic syndrome with focal and segmental glomerular lesions: Secondary | ICD-10-CM | POA: Diagnosis not present

## 2021-01-14 DIAGNOSIS — Z79899 Other long term (current) drug therapy: Secondary | ICD-10-CM | POA: Diagnosis not present

## 2021-01-14 DIAGNOSIS — Z87891 Personal history of nicotine dependence: Secondary | ICD-10-CM | POA: Insufficient documentation

## 2021-01-14 DIAGNOSIS — Z794 Long term (current) use of insulin: Secondary | ICD-10-CM | POA: Insufficient documentation

## 2021-01-14 LAB — CBC
HCT: 35.7 % — ABNORMAL LOW (ref 36.0–46.0)
Hemoglobin: 10.6 g/dL — ABNORMAL LOW (ref 12.0–15.0)
MCH: 27.2 pg (ref 26.0–34.0)
MCHC: 29.7 g/dL — ABNORMAL LOW (ref 30.0–36.0)
MCV: 91.5 fL (ref 80.0–100.0)
Platelets: 381 10*3/uL (ref 150–400)
RBC: 3.9 MIL/uL (ref 3.87–5.11)
RDW: 13.7 % (ref 11.5–15.5)
WBC: 8.4 10*3/uL (ref 4.0–10.5)
nRBC: 0 % (ref 0.0–0.2)

## 2021-01-14 LAB — PROTIME-INR
INR: 1.1 (ref 0.8–1.2)
Prothrombin Time: 13.8 seconds (ref 11.4–15.2)

## 2021-01-14 LAB — GLUCOSE, CAPILLARY
Glucose-Capillary: 106 mg/dL — ABNORMAL HIGH (ref 70–99)
Glucose-Capillary: 126 mg/dL — ABNORMAL HIGH (ref 70–99)

## 2021-01-14 MED ORDER — SODIUM CHLORIDE 0.9 % IV SOLN
INTRAVENOUS | Status: DC | PRN
  Administered 2021-01-14: 10 mL/h via INTRAVENOUS

## 2021-01-14 MED ORDER — GELATIN ABSORBABLE 12-7 MM EX MISC
CUTANEOUS | Status: AC
  Filled 2021-01-14: qty 1

## 2021-01-14 MED ORDER — FENTANYL CITRATE (PF) 100 MCG/2ML IJ SOLN
INTRAMUSCULAR | Status: AC
  Filled 2021-01-14: qty 2

## 2021-01-14 MED ORDER — LIDOCAINE HCL (PF) 1 % IJ SOLN
INTRAMUSCULAR | Status: AC
  Filled 2021-01-14: qty 30

## 2021-01-14 MED ORDER — MIDAZOLAM HCL 2 MG/2ML IJ SOLN
INTRAMUSCULAR | Status: AC
  Filled 2021-01-14: qty 2

## 2021-01-14 MED ORDER — MIDAZOLAM HCL 2 MG/2ML IJ SOLN
INTRAMUSCULAR | Status: DC | PRN
  Administered 2021-01-14: 1 mg via INTRAVENOUS
  Administered 2021-01-14 (×2): .5 mg via INTRAVENOUS

## 2021-01-14 MED ORDER — FENTANYL CITRATE (PF) 100 MCG/2ML IJ SOLN
INTRAMUSCULAR | Status: DC | PRN
  Administered 2021-01-14: 50 ug via INTRAVENOUS
  Administered 2021-01-14 (×2): 25 ug via INTRAVENOUS

## 2021-01-14 MED ORDER — SODIUM CHLORIDE 0.9 % IV SOLN: INTRAVENOUS | Status: DC

## 2021-01-14 NOTE — Procedures (Signed)
Interventional Radiology Procedure Note  Procedure: US guided biopsy of right kidney, medical renal Complications: None EBL: None Recommendations: - Bedrest 2 hours.   - Routine wound care - Follow up pathology - Advance diet   Signed,  Corrie Mckusick, DO

## 2021-01-14 NOTE — H&P (Signed)
Chief Complaint: Patient was seen in consultation today for random renal biopsy at the request of Finley S  Referring Physician(s): Greenwood S  Supervising Physician: Corrie Mckusick  Patient Status: [facility] - Out-pt  History of Present Illness: [name] [name] is a 55 y.o. female   GI Dr noted change in renal function Referred pt to Dr Theador Hawthorne +proteinuria CKD/DM/HTN Acute renal failure with tubular necrosis  Scheduled for random renal biopsy  Past Medical History:  Diagnosis Date   Diabetes ([place])    Gastroparesis    Grave's disease    diagnosed years ago/ now hypothyroidism   Hypertension    IBS (irritable colon syndrome)    constipation predominant    Past Surgical History:  Procedure Laterality Date   ABDOMINAL HYSTERECTOMY     attempted colonoscopy  03/2004   Dr. Rourk--> prep was poor. Exam to 40 cm. No gross abnormalities. Internal hemorrhoids. Air contrast barium enema was normal.   CHOLECYSTECTOMY     COLONOSCOPY N/A 10/30/2014   RMR: normal. next colonoscopy in 2026.   ESOPHAGEAL DILATION N/A 10/30/2014   Procedure: ESOPHAGEAL DILATION;  Surgeon: Daneil Dolin, MD;  Location: [facility];  Service: Endoscopy;  Laterality: N/A;   ESOPHAGOGASTRODUODENOSCOPY  01/2004   Dr. Almyra Free small polyps in postbulbar area ablated   ESOPHAGOGASTRODUODENOSCOPY N/A 10/30/2014   [id number]   ESOPHAGOGASTRODUODENOSCOPY (EGD) WITH PROPOFOL N/A 06/13/2020    Surgeon: Daneil Dolin, MD;  Normal exam   S/P Hysterectomy      TONSILLECTOMY     TUBAL LIGATION      Allergies: Codeine  Medications: Prior to Admission medications   Medication Sig Start Date End Date Taking? Authorizing Provider  ammonium lactate (LAC-HYDRIN) 12 % lotion Apply 1 application topically daily. Feet 05/20/20  Yes [provider]  atorvastatin (LIPITOR) 20 MG tablet Take 20 mg by mouth daily.   Yes [provider]  cetirizine (ZYRTEC) 10 MG tablet Take 10 mg  by mouth daily.   Yes [provider]  estradiol (ESTRACE) 1 MG tablet Take 1 mg by mouth daily. 09/30/20  Yes [provider]  levothyroxine (SYNTHROID) 75 MCG tablet Take 75 mcg by mouth daily before breakfast. 08/05/18  Yes [provider]  metoprolol succinate (TOPROL-XL) 25 MG 24 hr tablet Take 25 mg by mouth daily. 05/20/20  Yes [provider]  ondansetron (ZOFRAN-ODT) 4 MG disintegrating tablet DISSOLVE 1 TABLET IN MOUTH EVERY 8 HOURS AS NEEDED FOR NAUSEA FOR VOMITING 11/10/20  Yes Erenest Rasher, PA-C  pantoprazole (PROTONIX) 40 MG tablet Take 1 tablet (40 mg total) by mouth daily before breakfast. 07/30/20  Yes Erenest Rasher, PA-C  phentermine 37.5 MG capsule Take 37.5 mg by mouth daily. 10/01/20  Yes [provider]  polyethylene glycol powder (GLYCOLAX/MIRALAX) 17 GM/SCOOP powder Take 0.5 Containers by mouth daily.   Yes [provider]  promethazine (PHENERGAN) 12.5 MG tablet TAKE 1 TABLET BY MOUTH EVERY 6 HOURS AS NEEDED FOR NAUSEA FOR VOMITING 11/21/20  Yes Annitta Needs, NP  blood glucose meter kit and supplies Use to check your blood sugar three times a day as directed. (FOR ICD-10 E10.9, E11.9). 09/07/18   Barton Dubois, MD  insulin glargine (LANTUS SOLOSTAR) 100 UNIT/ML Solostar Pen Inject 10 Units into the skin at bedtime. Patient taking differently: Inject 2 Units into the skin daily as needed (blood glucos is over 150). 04/08/20   Brita Romp, NP  metoCLOPramide (REGLAN) 5 MG tablet TAKE 1/2 (  ONE-HALF) TABLET BY MOUTH TWICE DAILY AS DIRECTED Patient not taking: Reported on 01/12/2021 02/01/20   Mahala Menghini, PA-C     Family History  Problem Relation Age of Onset   Other Father 1       hit by train   Heart disease Mother    Kidney disease Mother    Colon cancer Neg Hx     Social History   Socioeconomic History   Marital status: Single    Spouse name: Not on file   Number of children: 1   Years of  education: Not on file   Highest education level: Not on file  Occupational History   Occupation: unemployed since July 3    Employer: [employer]  Tobacco Use   Smoking status: Former    Types: E-cigarettes   Smokeless tobacco: Never  Scientific laboratory technician Use: Never used  Substance and Sexual Activity   Alcohol use: Not Currently    Comment: occ   Drug use: No   Sexual activity: Yes    Birth control/protection: Surgical    Comment: hyst  Other Topics Concern   Not on file  Social History Narrative   1 grown healthy daughter   Social Determinants of Health   Financial Resource Strain: Not on file  Food Insecurity: Not on file  Transportation Needs: Not on file  Physical Activity: Not on file  Stress: Not on file  Social Connections: Not on file     Review of Systems: A 12 point ROS discussed and pertinent positives are indicated in the HPI above.  All other systems are negative.  Review of Systems  Constitutional:  Negative for activity change, fatigue, fever and unexpected weight change.  Respiratory:  Negative for cough and shortness of breath.   Cardiovascular:  Negative for chest pain.  Gastrointestinal:  Negative for abdominal pain.  Genitourinary:  Negative for difficulty urinating.  Musculoskeletal:  Negative for back pain.  Psychiatric/Behavioral:  Negative for behavioral problems and confusion.    Vital Signs: BP 138/83   Pulse 78   Temp 97.8 F (36.6 C) (Oral)   Resp 16   Ht 5' (1.524 m)   Wt 183 lb (83 kg)   SpO2 100%   BMI 35.74 kg/m   Physical Exam Vitals reviewed.  HENT:     Mouth/Throat:     Mouth: Mucous membranes are moist.  Cardiovascular:     Rate and Rhythm: Normal rate and regular rhythm.     Heart sounds: Normal heart sounds.  Pulmonary:     Effort: Pulmonary effort is normal.     Breath sounds: Normal breath sounds.  Abdominal:     Palpations: Abdomen is soft.  Musculoskeletal:        General: Normal range of  motion.     Right lower leg: No edema.     Left lower leg: No edema.  Skin:    General: Skin is warm.  Neurological:     Mental Status: She is alert and oriented to person, place, and time.  Psychiatric:        Behavior: Behavior normal.    Imaging: US RENAL  Result Date: 12/19/2020 CLINICAL DATA:  Chronic renal insufficiency, type 2 diabetes EXAM: RENAL / URINARY TRACT ULTRASOUND COMPLETE COMPARISON:  02/15/2020 FINDINGS: Right Kidney: Renal measurements: 8.9 x 4.3 by 4.6 cm = volume: 92.9 mL. Mild cortical thinning. Echotexture is normal. No hydronephrosis. Left Kidney: Renal measurements: 8.8 x 4.7 by 5.0 cm =  volume: 106.6 mL. Thinning of the renal cortex. Echotexture is normal. No hydronephrosis. Bladder: Appears normal for degree of bladder distention. Other: None. IMPRESSION: 1. Bilateral renal cortical thinning, left greater than right. 2. Otherwise unremarkable exam. Electronically Signed   By: Randa Ngo M.D.   On: 12/19/2020 21:23    Labs:  CBC: Recent Labs    07/03/20 1445 11/04/20 1532 12/22/20 1603 01/14/21 0625  WBC 6.9 7.5 7.5 8.4  HGB 11.0* 11.3* 9.6* 10.6*  HCT 36.3 37.4 32.1* 35.7*  PLT 318 368 348 381    COAGS: Recent Labs    01/14/21 0625  INR 1.1    BMP: Recent Labs    07/03/20 1445 11/04/20 1532 12/09/20 1510 12/22/20 1601  NA 138 138 139 137  K 4.8 4.5 4.4 4.1  CL 105 105 109 107  CO2 $Re'25 26 25 24  'bxV$ GLUCOSE 141* 109* 109* 117*  BUN 13 18 21* 19  CALCIUM 9.3 9.8 9.1 8.9  CREATININE 1.63* 2.00* 2.32* 2.82*  GFRNONAA 37* 29* 24* 19*    LIVER FUNCTION TESTS: Recent Labs    02/01/20 1011 03/06/20 1011 04/03/20 1457 07/03/20 1445 11/04/20 1532 12/09/20 1510 12/22/20 1601  BILITOT 1.1 1.1 0.9  --   --   --   --   AST 63* 30 31  --   --   --   --   ALT 138* 42 41  --   --   --   --   ALKPHOS 147* 90 113  --   --   --   --   PROT 8.3* 8.0 8.4*  --   --   --   --   ALBUMIN 4.0 3.9 4.0 3.9 3.8 3.5 3.6    TUMOR MARKERS: No  results for input(s): AFPTM, CEA, CA199, CHROMGRNA in the last 8760 hours.  Assessment and Plan:  Proteinuria HTN CKD/ tubular necrosis Scheduled for random renal biopsy Risks and benefits of random renal biopsy was discussed with the patient and/or patient's family including, but not limited to bleeding, infection, damage to adjacent structures or low yield requiring additional tests.  All of the questions were answered and there is agreement to proceed.  Consent signed and in chart.    Thank you for this interesting consult.  I greatly enjoyed meeting [name] [name] and look forward to participating in their care.  A copy of this report was sent to the requesting provider on this date.  Electronically Signed: Lavonia Drafts, PA-C 01/14/2021, 7:03 AM   I spent a total of  30 Minutes   in face to face in clinical consultation, greater than 50% of which was counseling/coordinating care for random renal bx

## 2021-01-15 LAB — SURGICAL PATHOLOGY

## 2021-01-21 ENCOUNTER — Encounter (HOSPITAL_COMMUNITY): Payer: Self-pay

## 2021-01-22 ENCOUNTER — Other Ambulatory Visit: Payer: Self-pay

## 2021-01-22 ENCOUNTER — Other Ambulatory Visit (HOSPITAL_COMMUNITY)
Admission: RE | Admit: 2021-01-22 | Discharge: 2021-01-22 | Disposition: A | Discharge status: Home / Self Care | Payer: BC Managed Care – PPO | Source: Ambulatory Visit | Attending: Nephrology | Admitting: Nephrology

## 2021-01-22 DIAGNOSIS — D638 Anemia in other chronic diseases classified elsewhere: Secondary | ICD-10-CM | POA: Insufficient documentation

## 2021-01-22 DIAGNOSIS — R809 Proteinuria, unspecified: Secondary | ICD-10-CM | POA: Insufficient documentation

## 2021-01-22 DIAGNOSIS — I129 Hypertensive chronic kidney disease with stage 1 through stage 4 chronic kidney disease, or unspecified chronic kidney disease: Secondary | ICD-10-CM | POA: Diagnosis not present

## 2021-01-22 DIAGNOSIS — E1129 Type 2 diabetes mellitus with other diabetic kidney complication: Secondary | ICD-10-CM | POA: Diagnosis not present

## 2021-01-22 DIAGNOSIS — N189 Chronic kidney disease, unspecified: Secondary | ICD-10-CM | POA: Diagnosis not present

## 2021-01-22 DIAGNOSIS — E1122 Type 2 diabetes mellitus with diabetic chronic kidney disease: Secondary | ICD-10-CM | POA: Diagnosis not present

## 2021-01-22 DIAGNOSIS — N170 Acute kidney failure with tubular necrosis: Secondary | ICD-10-CM | POA: Insufficient documentation

## 2021-01-22 LAB — RENAL FUNCTION PANEL
Albumin: 3.7 g/dL (ref 3.5–5.0)
Anion gap: 6 (ref 5–15)
BUN: 23 mg/dL — ABNORMAL HIGH (ref 6–20)
CO2: 24 mmol/L (ref 22–32)
Calcium: 9.2 mg/dL (ref 8.9–10.3)
Chloride: 109 mmol/L (ref 98–111)
Creatinine, Ser: 2.47 mg/dL — ABNORMAL HIGH (ref 0.44–1.00)
GFR, Estimated: 22 mL/min — ABNORMAL LOW (ref >60)
Glucose, Bld: 112 mg/dL — ABNORMAL HIGH (ref 70–99)
Phosphorus: 3.1 mg/dL (ref 2.5–4.6)
Potassium: 4.2 mmol/L (ref 3.5–5.1)
Sodium: 139 mmol/L (ref 135–145)

## 2021-01-22 LAB — FERRITIN: Ferritin: 202 ng/mL (ref 11–307)

## 2021-01-22 LAB — CBC
HCT: 33.5 % — ABNORMAL LOW (ref 36.0–46.0)
Hemoglobin: 10.2 g/dL — ABNORMAL LOW (ref 12.0–15.0)
MCH: 28.6 pg (ref 26.0–34.0)
MCHC: 30.4 g/dL (ref 30.0–36.0)
MCV: 93.8 fL (ref 80.0–100.0)
Platelets: 361 10*3/uL (ref 150–400)
RBC: 3.57 MIL/uL — ABNORMAL LOW (ref 3.87–5.11)
RDW: 13.6 % (ref 11.5–15.5)
WBC: 6.6 10*3/uL (ref 4.0–10.5)
nRBC: 0 % (ref 0.0–0.2)

## 2021-01-22 LAB — VITAMIN B12: Vitamin B-12: 427 pg/mL (ref 180–914)

## 2021-01-22 LAB — IRON AND TIBC
Iron: 65 ug/dL (ref 28–170)
Saturation Ratios: 19 % (ref 10.4–31.8)
TIBC: 339 ug/dL (ref 250–450)
UIBC: 274 ug/dL

## 2021-01-22 LAB — FOLATE: Folate: 20.4 ng/mL (ref >5.9)

## 2021-01-22 LAB — PROTEIN / CREATININE RATIO, URINE
Creatinine, Urine: 159.47 mg/dL
Protein Creatinine Ratio: 0.24 mg/mg{Cre} — ABNORMAL HIGH (ref 0.00–0.15)
Total Protein, Urine: 39 mg/dL

## 2021-02-08 ENCOUNTER — Encounter: Payer: Self-pay | Admitting: Gastroenterology

## 2021-02-08 NOTE — Progress Notes (Signed)
Referring Provider: Lemmie Evens, MD Primary Care Physician:  Celene Squibb, MD Primary GI Physician: Dr. Gala Romney  Chief Complaint  Patient presents with   Nausea    Occ, no vomiting   Gastroesophageal Reflux    ok   gastroparesis    HPI:   [name] [name] is a 55 y.o. female with GI history of GERD, gastroparesis, fatty liver presenting today for follow-up.  EGD February 2022 entirely normal.  Last seen in our office 07/30/2020.  Reported nausea and epigastric pain had resolved with increasing Reglan to 5 mg twice daily.  She is also making dietary changes trying to limit fried/fatty/greasy foods and soda and was down about 2 pounds in 2 months GERD well controlled on PPI daily.  No alarm symptoms.  No other significant GI symptoms.  Plan to trial decreasing Reglan to 2.5 mg twice daily, continue Phenergan as needed, continue PPI daily, follow-up in 6 months.  Today: Nausea daily without vomiting. Has been daily for a couple of weeks. No changes recently. Eating 3 meals a day, but smaller meals. On phentermine since March which is curving her appetite.  Intentionally down to 178 lbs from around 200 lbs in March. Eating a lot of raw vegetables. Taking Zofran TID and phenergan once daily which help for a while, but nausea returns.   Taking Protonix once daily. GERD well controlled. No dysphagia.  Taking Reglan 2.5 mg BID. Has been on 2.5 since last OV and has been doing fairly well until the last few weeks. Would like to increase back to 5 mg BID.  Denies abdominal pain, brbpr, melena, constipation, or diarrhea.   Last A1C was 7.   Past Medical History:  Diagnosis Date   Anemia of chronic disease    Diabetes ([place])    Gastroparesis    Grave's disease    diagnosed years ago/ now hypothyroidism   Hypertension    IBS (irritable colon syndrome)    constipation predominant    Past Surgical History:  Procedure Laterality Date   ABDOMINAL HYSTERECTOMY     attempted colonoscopy   03/2004   Dr. Rourk--> prep was poor. Exam to 40 cm. No gross abnormalities. Internal hemorrhoids. Air contrast barium enema was normal.   CHOLECYSTECTOMY     COLONOSCOPY N/A 10/30/2014   RMR: normal. next colonoscopy in 2026.   ESOPHAGEAL DILATION N/A 10/30/2014   Procedure: ESOPHAGEAL DILATION;  Surgeon: Daneil Dolin, MD;  Location: [facility];  Service: Endoscopy;  Laterality: N/A;   ESOPHAGOGASTRODUODENOSCOPY  01/2004   Dr. Almyra Free small polyps in postbulbar area ablated   ESOPHAGOGASTRODUODENOSCOPY N/A 10/30/2014   [id number]   ESOPHAGOGASTRODUODENOSCOPY (EGD) WITH PROPOFOL N/A 06/13/2020    Surgeon: Daneil Dolin, MD;  Normal exam   S/P Hysterectomy      TONSILLECTOMY     TUBAL LIGATION      Current Outpatient Medications  Medication Sig Dispense Refill   ammonium lactate (LAC-HYDRIN) 12 % lotion Apply 1 application topically daily. Feet     atorvastatin (LIPITOR) 20 MG tablet Take 20 mg by mouth daily.     blood glucose meter kit and supplies Use to check your blood sugar three times a day as directed. (FOR ICD-10 E10.9, E11.9). 1 each 0   cetirizine (ZYRTEC) 10 MG tablet Take 10 mg by mouth daily.     estradiol (ESTRACE) 1 MG tablet Take 1 mg by mouth daily.     insulin glargine (LANTUS SOLOSTAR) 100 UNIT/ML Solostar Pen  Inject 10 Units into the skin at bedtime. (Patient taking differently: Inject 2 Units into the skin daily as needed (blood glucos is over 150).) 15 mL 1   levothyroxine (SYNTHROID) 75 MCG tablet Take 75 mcg by mouth daily before breakfast.     metoCLOPramide (REGLAN) 5 MG tablet TAKE 1/2 (ONE-HALF) TABLET BY MOUTH TWICE DAILY AS DIRECTED 30 tablet 3   metoprolol succinate (TOPROL-XL) 25 MG 24 hr tablet Take 25 mg by mouth daily.     ondansetron (ZOFRAN-ODT) 4 MG disintegrating tablet DISSOLVE 1 TABLET IN MOUTH EVERY 8 HOURS AS NEEDED FOR NAUSEA FOR VOMITING 30 tablet 2   pantoprazole (PROTONIX) 40 MG tablet Take 1 tablet (40 mg total) by mouth daily before  breakfast. 90 tablet 3   phentermine 37.5 MG capsule Take 37.5 mg by mouth daily.     polyethylene glycol powder (GLYCOLAX/MIRALAX) 17 GM/SCOOP powder Take 17 g by mouth daily.     promethazine (PHENERGAN) 12.5 MG tablet TAKE 1 TABLET BY MOUTH EVERY 6 HOURS AS NEEDED FOR NAUSEA FOR VOMITING 30 tablet 2   No current facility-administered medications for this visit.    Allergies as of 02/09/2021 - Review Complete 02/09/2021  Allergen Reaction Noted   Codeine Nausea And Vomiting     Family History  Problem Relation Age of Onset   Other Father 18       hit by train   Heart disease Mother    Kidney disease Mother    Colon cancer Neg Hx     Social History   Socioeconomic History   Marital status: Single    Spouse name: Not on file   Number of children: 1   Years of education: Not on file   Highest education level: Not on file  Occupational History   Occupation: unemployed since July 3    Employer: [employer]  Tobacco Use   Smoking status: Former    Types: E-cigarettes   Smokeless tobacco: Never  Scientific laboratory technician Use: Never used  Substance and Sexual Activity   Alcohol use: Not Currently    Comment: occ   Drug use: No   Sexual activity: Yes    Birth control/protection: Surgical    Comment: hyst  Other Topics Concern   Not on file  Social History Narrative   1 grown healthy daughter   Social Determinants of Health   Financial Resource Strain: Not on file  Food Insecurity: Not on file  Transportation Needs: Not on file  Physical Activity: Not on file  Stress: Not on file  Social Connections: Not on file    Review of Systems: Gen: Denies fever, chills, cold or flulike symptoms, presyncope, syncope. No recent illness.  CV: Denies chest pain, palpitations. Resp: Denies dyspnea at rest or cough. GI: See HPI Heme: See HPI  Physical Exam: BP 127/77   Pulse 84   Temp (!) 97.3 F (36.3 C) (Temporal)   Ht 5' (1.524 m)   Wt 178 lb 9.6 oz (81 kg)    BMI 34.88 kg/m  General:   Alert and oriented. No distress noted. Pleasant and cooperative.  Head:  Normocephalic and atraumatic. Eyes:  Conjuctiva clear without scleral icterus. Heart:  S1, S2 present without murmurs appreciated. Lungs:  Clear to auscultation bilaterally. No wheezes, rales, or rhonchi. No distress.  Abdomen:  +BS, soft, non-tender and non-distended. No rebound or guarding. No HSM or masses noted. Msk:  Symmetrical without gross deformities. Normal posture. Extremities:  Without edema. Neurologic:  Alert and  oriented x4 Psych: Normal mood and affect.    Assessment: 55 year old female presenting today for follow-up of GERD and gastroparesis, currently having nausea daily without vomiting for the last 2 weeks.  Notably, we decreased her Reglan from 5 mg twice daily to 2.5 mg twice daily in April 2022.  She had been doing well up until the last 2 weeks.  She denies any recent dietary, lifestyle, medication changes or recent illness.  Currently using Zofran 3 times daily and Phenergan daily to control symptoms.  Typical GERD symptoms well controlled on Protonix 40 mg daily.  No alarm symptoms.  No other significant GI symptoms.  Reports her last hemoglobin A1c was 7. EGD February 2022, entirely normal.  Overall, suspect daily nausea likely secondary to flare of gastroparesis.  Diet may be contributing as she consumes raw vegetables regularly.  She would like to increase Reglan back to 5 mg twice daily for now which I think is reasonable.  Also reinforced strict gastroparesis diet.  We may be able to decrease Reglan again in the near future.  She has never had any adverse effects from Reglan.  She was counseled on side effects to monitor for again today.    Plan: Increase Reglan to 5 mg twice daily before breakfast and dinner. Advised to monitor for involuntary muscle movements/facial twitching, stop medication and let us know if this occurs. Strict gastroparesis diet.  Separate  handout provided. Continue Protonix 40 mg daily. Continue Zofran as needed Continue Phenergan as needed Follow-up in 3 months.  Advised to call with any questions or concerns prior.   Aliene Altes, PA-C [facility] 02/09/2021

## 2021-02-09 ENCOUNTER — Other Ambulatory Visit: Payer: Self-pay

## 2021-02-09 ENCOUNTER — Ambulatory Visit: Payer: BC Managed Care – PPO | Admitting: Gastroenterology

## 2021-02-09 ENCOUNTER — Encounter: Payer: Self-pay | Admitting: Gastroenterology

## 2021-02-09 VITALS — BP 127/77 | HR 84 | Temp 97.3°F | Ht 60.0 in | Wt 178.6 lb

## 2021-02-09 DIAGNOSIS — K3184 Gastroparesis: Secondary | ICD-10-CM

## 2021-02-09 DIAGNOSIS — K219 Gastro-esophageal reflux disease without esophagitis: Secondary | ICD-10-CM | POA: Diagnosis not present

## 2021-02-09 DIAGNOSIS — R110 Nausea: Secondary | ICD-10-CM | POA: Diagnosis not present

## 2021-02-09 NOTE — Patient Instructions (Signed)
To help with daily nausea, increase Reglan to 5 mg twice daily.  Take before breakfast and dinner.  Follow a gastroparesis diet: Eat 4-6 small meals daily. Follow a low fat/low fiber diet. See separate handout.  Continue Protonix 40 mg daily.  Continue Zofran as needed.  Continue Phenergan as needed.  Please let me know if you continue with significant nausea in 2-4 weeks.   We will plan to see back in about 3 months.  Do not hesitate to call if you have questions or concerns prior to her next visit.  It was great to see you again!  Aliene Altes, PA-C [facility]

## 2021-02-10 DIAGNOSIS — N184 Chronic kidney disease, stage 4 (severe): Secondary | ICD-10-CM | POA: Diagnosis not present

## 2021-02-10 DIAGNOSIS — I129 Hypertensive chronic kidney disease with stage 1 through stage 4 chronic kidney disease, or unspecified chronic kidney disease: Secondary | ICD-10-CM | POA: Diagnosis not present

## 2021-02-10 DIAGNOSIS — R809 Proteinuria, unspecified: Secondary | ICD-10-CM | POA: Diagnosis not present

## 2021-02-10 DIAGNOSIS — D638 Anemia in other chronic diseases classified elsewhere: Secondary | ICD-10-CM | POA: Diagnosis not present

## 2021-03-02 DIAGNOSIS — I10 Essential (primary) hypertension: Secondary | ICD-10-CM | POA: Diagnosis not present

## 2021-03-02 DIAGNOSIS — E039 Hypothyroidism, unspecified: Secondary | ICD-10-CM | POA: Diagnosis not present

## 2021-03-02 DIAGNOSIS — E1165 Type 2 diabetes mellitus with hyperglycemia: Secondary | ICD-10-CM | POA: Diagnosis not present

## 2021-03-05 ENCOUNTER — Other Ambulatory Visit (HOSPITAL_COMMUNITY): Payer: Self-pay | Admitting: Family Medicine

## 2021-03-05 DIAGNOSIS — E669 Obesity, unspecified: Secondary | ICD-10-CM | POA: Diagnosis not present

## 2021-03-05 DIAGNOSIS — Z1231 Encounter for screening mammogram for malignant neoplasm of breast: Secondary | ICD-10-CM

## 2021-03-05 DIAGNOSIS — I10 Essential (primary) hypertension: Secondary | ICD-10-CM | POA: Diagnosis not present

## 2021-03-05 DIAGNOSIS — R002 Palpitations: Secondary | ICD-10-CM | POA: Diagnosis not present

## 2021-03-05 DIAGNOSIS — E1165 Type 2 diabetes mellitus with hyperglycemia: Secondary | ICD-10-CM | POA: Diagnosis not present

## 2021-03-05 DIAGNOSIS — E782 Mixed hyperlipidemia: Secondary | ICD-10-CM | POA: Diagnosis not present

## 2021-03-23 ENCOUNTER — Other Ambulatory Visit (HOSPITAL_COMMUNITY)
Admission: RE | Admit: 2021-03-23 | Discharge: 2021-03-23 | Disposition: A | Discharge status: Home / Self Care | Payer: BC Managed Care – PPO | Source: Ambulatory Visit | Attending: Nephrology | Admitting: Nephrology

## 2021-03-23 DIAGNOSIS — N119 Chronic tubulo-interstitial nephritis, unspecified: Secondary | ICD-10-CM | POA: Insufficient documentation

## 2021-03-23 DIAGNOSIS — E1129 Type 2 diabetes mellitus with other diabetic kidney complication: Secondary | ICD-10-CM | POA: Insufficient documentation

## 2021-03-23 DIAGNOSIS — D638 Anemia in other chronic diseases classified elsewhere: Secondary | ICD-10-CM | POA: Diagnosis not present

## 2021-03-23 DIAGNOSIS — R809 Proteinuria, unspecified: Secondary | ICD-10-CM | POA: Diagnosis not present

## 2021-03-23 DIAGNOSIS — N184 Chronic kidney disease, stage 4 (severe): Secondary | ICD-10-CM | POA: Diagnosis not present

## 2021-03-23 DIAGNOSIS — I129 Hypertensive chronic kidney disease with stage 1 through stage 4 chronic kidney disease, or unspecified chronic kidney disease: Secondary | ICD-10-CM | POA: Insufficient documentation

## 2021-03-23 LAB — RENAL FUNCTION PANEL
Albumin: 3.8 g/dL (ref 3.5–5.0)
Anion gap: 7 (ref 5–15)
BUN: 16 mg/dL (ref 6–20)
CO2: 24 mmol/L (ref 22–32)
Calcium: 9.3 mg/dL (ref 8.9–10.3)
Chloride: 107 mmol/L (ref 98–111)
Creatinine, Ser: 2.27 mg/dL — ABNORMAL HIGH (ref 0.44–1.00)
GFR, Estimated: 25 mL/min — ABNORMAL LOW (ref >60)
Glucose, Bld: 125 mg/dL — ABNORMAL HIGH (ref 70–99)
Phosphorus: 3.1 mg/dL (ref 2.5–4.6)
Potassium: 4.5 mmol/L (ref 3.5–5.1)
Sodium: 138 mmol/L (ref 135–145)

## 2021-03-23 LAB — IRON AND TIBC
Iron: 80 ug/dL (ref 28–170)
Saturation Ratios: 22 % (ref 10.4–31.8)
TIBC: 363 ug/dL (ref 250–450)
UIBC: 283 ug/dL

## 2021-03-23 LAB — PROTEIN / CREATININE RATIO, URINE
Creatinine, Urine: 281.43 mg/dL
Protein Creatinine Ratio: 0.18 mg/mg{Cre} — ABNORMAL HIGH (ref 0.00–0.15)
Total Protein, Urine: 52 mg/dL

## 2021-03-23 LAB — CBC
HCT: 36.5 % (ref 36.0–46.0)
Hemoglobin: 11.1 g/dL — ABNORMAL LOW (ref 12.0–15.0)
MCH: 28.2 pg (ref 26.0–34.0)
MCHC: 30.4 g/dL (ref 30.0–36.0)
MCV: 92.9 fL (ref 80.0–100.0)
Platelets: 352 10*3/uL (ref 150–400)
RBC: 3.93 MIL/uL (ref 3.87–5.11)
RDW: 13.8 % (ref 11.5–15.5)
WBC: 6.5 10*3/uL (ref 4.0–10.5)
nRBC: 0 % (ref 0.0–0.2)

## 2021-03-23 LAB — FERRITIN: Ferritin: 160 ng/mL (ref 11–307)

## 2021-03-23 LAB — VITAMIN D 25 HYDROXY (VIT D DEFICIENCY, FRACTURES): Vit D, 25-Hydroxy: 39.38 ng/mL (ref 30–100)

## 2021-03-24 LAB — PTH, INTACT AND CALCIUM
Calcium, Total (PTH): 9.4 mg/dL (ref 8.7–10.2)
PTH: 37 pg/mL (ref 15–65)

## 2021-03-26 DIAGNOSIS — N041 Nephrotic syndrome with focal and segmental glomerular lesions: Secondary | ICD-10-CM | POA: Diagnosis not present

## 2021-03-26 DIAGNOSIS — N119 Chronic tubulo-interstitial nephritis, unspecified: Secondary | ICD-10-CM | POA: Diagnosis not present

## 2021-03-26 DIAGNOSIS — N184 Chronic kidney disease, stage 4 (severe): Secondary | ICD-10-CM | POA: Diagnosis not present

## 2021-03-26 DIAGNOSIS — D638 Anemia in other chronic diseases classified elsewhere: Secondary | ICD-10-CM | POA: Diagnosis not present

## 2021-04-07 ENCOUNTER — Other Ambulatory Visit (HOSPITAL_COMMUNITY)
Admission: RE | Admit: 2021-04-07 | Discharge: 2021-04-07 | Disposition: A | Discharge status: Home / Self Care | Payer: BC Managed Care – PPO | Source: Ambulatory Visit | Attending: Nephrology | Admitting: Nephrology

## 2021-04-07 DIAGNOSIS — D638 Anemia in other chronic diseases classified elsewhere: Secondary | ICD-10-CM | POA: Diagnosis not present

## 2021-04-07 DIAGNOSIS — N184 Chronic kidney disease, stage 4 (severe): Secondary | ICD-10-CM | POA: Diagnosis not present

## 2021-04-07 DIAGNOSIS — I129 Hypertensive chronic kidney disease with stage 1 through stage 4 chronic kidney disease, or unspecified chronic kidney disease: Secondary | ICD-10-CM | POA: Diagnosis not present

## 2021-04-07 DIAGNOSIS — N041 Nephrotic syndrome with focal and segmental glomerular lesions: Secondary | ICD-10-CM | POA: Insufficient documentation

## 2021-04-07 DIAGNOSIS — E6609 Other obesity due to excess calories: Secondary | ICD-10-CM | POA: Insufficient documentation

## 2021-04-07 DIAGNOSIS — N119 Chronic tubulo-interstitial nephritis, unspecified: Secondary | ICD-10-CM | POA: Insufficient documentation

## 2021-04-07 DIAGNOSIS — R809 Proteinuria, unspecified: Secondary | ICD-10-CM | POA: Diagnosis not present

## 2021-04-07 LAB — RENAL FUNCTION PANEL
Albumin: 3.6 g/dL (ref 3.5–5.0)
Anion gap: 6 (ref 5–15)
BUN: 22 mg/dL — ABNORMAL HIGH (ref 6–20)
CO2: 25 mmol/L (ref 22–32)
Calcium: 9.3 mg/dL (ref 8.9–10.3)
Chloride: 106 mmol/L (ref 98–111)
Creatinine, Ser: 2.12 mg/dL — ABNORMAL HIGH (ref 0.44–1.00)
GFR, Estimated: 27 mL/min — ABNORMAL LOW (ref >60)
Glucose, Bld: 123 mg/dL — ABNORMAL HIGH (ref 70–99)
Phosphorus: 3.3 mg/dL (ref 2.5–4.6)
Potassium: 4.5 mmol/L (ref 3.5–5.1)
Sodium: 137 mmol/L (ref 135–145)

## 2021-05-08 ENCOUNTER — Other Ambulatory Visit: Payer: Self-pay | Admitting: Podiatry

## 2021-05-08 DIAGNOSIS — M722 Plantar fascial fibromatosis: Secondary | ICD-10-CM | POA: Diagnosis not present

## 2021-05-12 ENCOUNTER — Other Ambulatory Visit: Payer: Self-pay | Admitting: Podiatry

## 2021-05-12 DIAGNOSIS — M722 Plantar fascial fibromatosis: Secondary | ICD-10-CM

## 2021-05-21 ENCOUNTER — Ambulatory Visit
Admission: RE | Admit: 2021-05-21 | Discharge: 2021-05-21 | Disposition: A | Discharge status: Home / Self Care | Payer: BC Managed Care – PPO | Source: Ambulatory Visit | Attending: Podiatry | Admitting: Podiatry

## 2021-05-21 ENCOUNTER — Other Ambulatory Visit: Payer: Self-pay

## 2021-05-21 DIAGNOSIS — M722 Plantar fascial fibromatosis: Secondary | ICD-10-CM

## 2021-05-23 ENCOUNTER — Encounter (HOSPITAL_COMMUNITY): Payer: Self-pay | Admitting: Emergency Medicine

## 2021-05-23 ENCOUNTER — Emergency Department (HOSPITAL_COMMUNITY)
Admission: EM | Admit: 2021-05-23 | Discharge: 2021-05-23 | Disposition: A | Discharge status: Home / Self Care | Payer: BC Managed Care – PPO | Attending: Emergency Medicine | Admitting: Emergency Medicine

## 2021-05-23 ENCOUNTER — Other Ambulatory Visit: Payer: Self-pay

## 2021-05-23 DIAGNOSIS — Z794 Long term (current) use of insulin: Secondary | ICD-10-CM | POA: Insufficient documentation

## 2021-05-23 DIAGNOSIS — E119 Type 2 diabetes mellitus without complications: Secondary | ICD-10-CM | POA: Diagnosis not present

## 2021-05-23 DIAGNOSIS — Z760 Encounter for issue of repeat prescription: Secondary | ICD-10-CM | POA: Insufficient documentation

## 2021-05-23 DIAGNOSIS — Z7984 Long term (current) use of oral hypoglycemic drugs: Secondary | ICD-10-CM | POA: Diagnosis not present

## 2021-05-23 MED ORDER — LANTUS SOLOSTAR 100 UNIT/ML ~~LOC~~ SOPN: 2.0000 [IU] | PEN_INJECTOR | Freq: Every day | SUBCUTANEOUS | 0 refills | Status: DC | PRN

## 2021-05-23 NOTE — Discharge Instructions (Signed)
I have refilled your Lantus. It is very important that you follow up with your primary care doctor and have your medications reviewed to make sure you are taking the right medications and dosages for your diabetes care.

## 2021-05-23 NOTE — ED Triage Notes (Signed)
Blood glucose 202 at home. Pt says she is out of insulin. Has  attempted to contact PCP with no response. Pt here for med refill.

## 2021-05-23 NOTE — ED Notes (Signed)
Pt d/c home per MD order. Discharge summary reviewed, pt verbalizes understanding. Ambulatory off unit. No s/s of acute distress noted at discharge.  °

## 2021-05-23 NOTE — ED Provider Notes (Signed)
[facility] Provider Note   CSN: [id number] Arrival date & time: 05/23/21  1327     History No chief complaint on file.   [name] [name] is a 56 y.o. female. PMH of DMII.  Patient presents the emergency department with complaint of medication refill.  She states she has run out of her Lantus and she called her PCP today and they were closed.  She takes Lantus 2 units daily. She is unsure when the last time she took this was. Her last blood sugar at home was 202.  She denies any symptoms such as increased urination, abdominal pain, nausea, vomiting, chest pain, shortness of breath.  HPI     Home Medications Prior to Admission medications   Medication Sig Start Date End Date Taking? Authorizing Provider  ammonium lactate (LAC-HYDRIN) 12 % lotion Apply 1 application topically daily. Feet 05/20/20   [provider]  atorvastatin (LIPITOR) 20 MG tablet Take 20 mg by mouth daily.    [provider]  blood glucose meter kit and supplies Use to check your blood sugar three times a day as directed. (FOR ICD-10 E10.9, E11.9). 09/07/18   Barton Dubois, MD  cetirizine (ZYRTEC) 10 MG tablet Take 10 mg by mouth daily.    [provider]  estradiol (ESTRACE) 1 MG tablet Take 1 mg by mouth daily. 09/30/20   [provider]  insulin glargine (LANTUS SOLOSTAR) 100 UNIT/ML Solostar Pen Inject 2 Units into the skin daily as needed (blood glucos is over 150). 05/23/21 06/12/23  Yancarlos Berthold, Adora Fridge, PA-C  levothyroxine (SYNTHROID) 75 MCG tablet Take 75 mcg by mouth daily before breakfast. 08/05/18   [provider]  metoCLOPramide (REGLAN) 5 MG tablet TAKE 1/2 (ONE-HALF) TABLET BY MOUTH TWICE DAILY AS DIRECTED 02/01/20   Mahala Menghini, PA-C  metoprolol succinate (TOPROL-XL) 25 MG 24 hr tablet Take 25 mg by mouth daily. 05/20/20   [provider]  ondansetron (ZOFRAN-ODT) 4 MG disintegrating tablet DISSOLVE 1 TABLET IN MOUTH EVERY 8 HOURS AS  NEEDED FOR NAUSEA FOR VOMITING 11/10/20   Erenest Rasher, PA-C  pantoprazole (PROTONIX) 40 MG tablet Take 1 tablet (40 mg total) by mouth daily before breakfast. 07/30/20   Erenest Rasher, PA-C  phentermine 37.5 MG capsule Take 37.5 mg by mouth daily. 10/01/20   [provider]  polyethylene glycol powder (GLYCOLAX/MIRALAX) 17 GM/SCOOP powder Take 17 g by mouth daily.    [provider]  promethazine (PHENERGAN) 12.5 MG tablet TAKE 1 TABLET BY MOUTH EVERY 6 HOURS AS NEEDED FOR NAUSEA FOR VOMITING 11/21/20   Annitta Needs, NP      Allergies    Codeine    Review of Systems   Review of Systems  All other systems reviewed and are negative.  Physical Exam Updated Vital Signs BP 135/77    Pulse 78    Temp 98.3 F (36.8 C) (Oral)    Resp 16    SpO2 100%  Physical Exam Vitals and nursing note reviewed.  Constitutional:      General: She is not in acute distress.    Appearance: Normal appearance. She is well-developed. She is not ill-appearing, toxic-appearing or diaphoretic.  HENT:     Head: Normocephalic and atraumatic.     Nose: No nasal deformity.     Mouth/Throat:     Lips: Pink. No lesions.  Eyes:     General: Gaze aligned appropriately. No scleral icterus.  Right eye: No discharge.        Left eye: No discharge.     Conjunctiva/sclera: Conjunctivae normal.     Right eye: Right conjunctiva is not injected. No exudate or hemorrhage.    Left eye: Left conjunctiva is not injected. No exudate or hemorrhage. Pulmonary:     Effort: Pulmonary effort is normal. No respiratory distress.  Skin:    General: Skin is warm and dry.  Neurological:     Mental Status: She is alert and oriented to person, place, and time.  Psychiatric:        Mood and Affect: Mood normal.        Speech: Speech normal.        Behavior: Behavior normal. Behavior is cooperative.    ED Results / Procedures / Treatments   Labs (all labs ordered are listed, but only abnormal results are  displayed) Labs Reviewed - No data to display  EKG None  Radiology MR ANKLE RIGHT WO CONTRAST  Result Date: 05/22/2021 CLINICAL DATA:  Medial right ankle and heel pain for 3 years. EXAM: MRI OF THE RIGHT ANKLE WITHOUT CONTRAST TECHNIQUE: Multiplanar, multisequence MR imaging of the ankle was performed. No intravenous contrast was administered. COMPARISON:  Right foot radiographs 09/03/2018 MRI right ankle 09/28/2019 FINDINGS: TENDONS Peroneal: There is subtle linear intermediate T2 signal within the midsubstance of the peroneus brevis tendon starting at the mid to superior aspect of the anterior process of the calcaneus and extending close to the distal tendon insertion (axial images 19 through 23). This is very similar to prior and may represent a mild chronic partial-thickness tear. The peroneus longus tendon is intact. Posteromedial: Intact tibialis posterior, flexor digitorum longus, and flexor hallucis longus tendons. Anterior: The tibialis anterior, extensor hallucis longus and extensor digitorum longus tendons are intact. Achilles: Intact. Plantar Fascia: Intact. LIGAMENTS Lateral: The anterior and posterior talofibular, anterior and posterior tibiofibular, and calcaneofibular ligaments are intact. Medial: The tibiotalar deep deltoid and tibial spring ligaments are intact. CARTILAGE Ankle Joint: Intact cartilage. Subtalar Joints/Sinus Tarsi: Fat is preserved within sinus tarsi. Bones: Normal marrow signal. No acute fracture. Other: Small ganglia again extend laterally from the calcaneocuboid articulation (axial image 22, sagittal image 8). IMPRESSION:: IMPRESSION: 1. 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 This is very similar to prior. 2. No significant abnormality is seen to indicate fan explanation for the patient's medial ankle pain. Electronically Signed   By: Yvonne Kendall M.D.   On: 05/22/2021 16:23    Procedures Procedures    Medications Ordered in  ED Medications - No data to display  ED Course/ Medical Decision Making/ A&P                           Medical Decision Making Problems Addressed: Medication refill: chronic illness or injury  Amount and/or Complexity of Data Reviewed Independent Historian:     Details: indepentent historian External Data Reviewed: notes.    Details: reviewed previous PCP notes. Reviewed previous medications  Risk Prescription drug management.   This is a 56 y.o. female with a PMH of DM2 who presents to the ED with medication refill needs.  I reviewed past medical records and other healthcare provider notes.  Patient is on 2 units of Lantus nightly.  On review of her home blood sugar logs, blood sugars have been ranged from 150 and 200.  She checks this daily. She has not had any symptoms suggesting  hypoglycemia and has no other symptoms today.  Do not feel we need labs and have recent blood sugar log that she has shared with me. I have refilled this medication and confirm with pharmacy the dose patient is on. I urged patient she is to follow-up with her PCP for further management of her diabetes.  It sounds like she has not been taking this medication consistently and blood sugars are high but not overtly high.  The dose that she is on is also very small.  She says no one has addressed her diabetes with her in over a year and she is mostly managing at home with as needed insulin dosages and diet and exercise.  She needs to schedule follow-up appointment with PCP regarding her chronic management of her disease.  Portions of this note were generated with Lobby[employer]. Dictation errors may occur despite best attempts at proofreading.  Final Clinical Impression(s) / ED Diagnoses Final diagnoses:  Medication refill    Rx / DC Orders ED Discharge Orders          Ordered    insulin glargine (LANTUS SOLOSTAR) 100 UNIT/ML Solostar Pen  Daily PRN       Note to Pharmacy: Please let patient  know insurance would not cover the 70/30.  I switched her to Lantus to be taken once nightly, regardless of blood glucose level before bed.  I still want her to monitor glucose 4 times daily.   05/23/21 [place], [place], PA-C 05/23/21 1514    Dorie Rank, MD 05/24/21 [phone]

## 2021-05-28 ENCOUNTER — Other Ambulatory Visit: Payer: Self-pay

## 2021-05-28 ENCOUNTER — Other Ambulatory Visit (HOSPITAL_COMMUNITY)
Admission: RE | Admit: 2021-05-28 | Discharge: 2021-05-28 | Disposition: A | Discharge status: Home / Self Care | Payer: BC Managed Care – PPO | Source: Ambulatory Visit | Attending: Nephrology | Admitting: Nephrology

## 2021-05-28 DIAGNOSIS — D638 Anemia in other chronic diseases classified elsewhere: Secondary | ICD-10-CM | POA: Insufficient documentation

## 2021-05-28 DIAGNOSIS — N184 Chronic kidney disease, stage 4 (severe): Secondary | ICD-10-CM | POA: Diagnosis not present

## 2021-05-28 DIAGNOSIS — N041 Nephrotic syndrome with focal and segmental glomerular lesions: Secondary | ICD-10-CM | POA: Insufficient documentation

## 2021-05-28 DIAGNOSIS — R809 Proteinuria, unspecified: Secondary | ICD-10-CM | POA: Diagnosis not present

## 2021-05-28 DIAGNOSIS — N119 Chronic tubulo-interstitial nephritis, unspecified: Secondary | ICD-10-CM | POA: Insufficient documentation

## 2021-05-28 DIAGNOSIS — E6609 Other obesity due to excess calories: Secondary | ICD-10-CM | POA: Insufficient documentation

## 2021-05-28 DIAGNOSIS — I129 Hypertensive chronic kidney disease with stage 1 through stage 4 chronic kidney disease, or unspecified chronic kidney disease: Secondary | ICD-10-CM | POA: Diagnosis not present

## 2021-05-28 LAB — CBC
HCT: 35.9 % — ABNORMAL LOW (ref 36.0–46.0)
Hemoglobin: 10.8 g/dL — ABNORMAL LOW (ref 12.0–15.0)
MCH: 28.1 pg (ref 26.0–34.0)
MCHC: 30.1 g/dL (ref 30.0–36.0)
MCV: 93.5 fL (ref 80.0–100.0)
Platelets: 343 10*3/uL (ref 150–400)
RBC: 3.84 MIL/uL — ABNORMAL LOW (ref 3.87–5.11)
RDW: 13.5 % (ref 11.5–15.5)
WBC: 7.2 10*3/uL (ref 4.0–10.5)
nRBC: 0 % (ref 0.0–0.2)

## 2021-05-28 LAB — RENAL FUNCTION PANEL
Albumin: 3.9 g/dL (ref 3.5–5.0)
Anion gap: 6 (ref 5–15)
BUN: 22 mg/dL — ABNORMAL HIGH (ref 6–20)
CO2: 25 mmol/L (ref 22–32)
Calcium: 9.6 mg/dL (ref 8.9–10.3)
Chloride: 103 mmol/L (ref 98–111)
Creatinine, Ser: 1.97 mg/dL — ABNORMAL HIGH (ref 0.44–1.00)
GFR, Estimated: 29 mL/min — ABNORMAL LOW (ref >60)
Glucose, Bld: 88 mg/dL (ref 70–99)
Phosphorus: 4.1 mg/dL (ref 2.5–4.6)
Potassium: 3.9 mmol/L (ref 3.5–5.1)
Sodium: 134 mmol/L — ABNORMAL LOW (ref 135–145)

## 2021-05-28 LAB — PROTEIN / CREATININE RATIO, URINE
Creatinine, Urine: 56.66 mg/dL
Protein Creatinine Ratio: 0.11 mg/mg{Cre} (ref 0.00–0.15)
Total Protein, Urine: 6 mg/dL

## 2021-06-04 DIAGNOSIS — N184 Chronic kidney disease, stage 4 (severe): Secondary | ICD-10-CM | POA: Diagnosis not present

## 2021-06-04 DIAGNOSIS — N119 Chronic tubulo-interstitial nephritis, unspecified: Secondary | ICD-10-CM | POA: Diagnosis not present

## 2021-06-04 DIAGNOSIS — N041 Nephrotic syndrome with focal and segmental glomerular lesions: Secondary | ICD-10-CM | POA: Diagnosis not present

## 2021-06-04 DIAGNOSIS — D638 Anemia in other chronic diseases classified elsewhere: Secondary | ICD-10-CM | POA: Diagnosis not present

## 2021-06-05 ENCOUNTER — Other Ambulatory Visit: Payer: Self-pay | Admitting: Gastroenterology

## 2021-06-05 DIAGNOSIS — K3184 Gastroparesis: Secondary | ICD-10-CM

## 2021-06-08 DIAGNOSIS — R002 Palpitations: Secondary | ICD-10-CM | POA: Diagnosis not present

## 2021-06-08 DIAGNOSIS — E1165 Type 2 diabetes mellitus with hyperglycemia: Secondary | ICD-10-CM | POA: Diagnosis not present

## 2021-06-08 DIAGNOSIS — E669 Obesity, unspecified: Secondary | ICD-10-CM | POA: Diagnosis not present

## 2021-06-18 DIAGNOSIS — Z961 Presence of intraocular lens: Secondary | ICD-10-CM | POA: Diagnosis not present

## 2021-06-18 DIAGNOSIS — E119 Type 2 diabetes mellitus without complications: Secondary | ICD-10-CM | POA: Diagnosis not present

## 2021-06-18 DIAGNOSIS — H524 Presbyopia: Secondary | ICD-10-CM | POA: Diagnosis not present

## 2021-06-19 DIAGNOSIS — M722 Plantar fascial fibromatosis: Secondary | ICD-10-CM | POA: Diagnosis not present

## 2021-07-02 NOTE — Progress Notes (Signed)
GI Office Note    Referring Provider: Celene Squibb, MD Primary Care Physician:  Celene Squibb, MD Primary GI: Dr. Gala Romney  Date:  07/03/2021  ID:  [name], DOB 11-27-1965, MRN [id number]   Chief Complaint   Chief Complaint  Patient presents with   Follow-up    Nauseated      History of Present Illness  MARSIA CINO is a 56 y.o. female with history of diabetes, HTN, IBS, fatty liver, GERD, gastroparesis presenting today for follow-up of GERD gastroparesis.  Last EGD February 2022 was normal.  She was last seen in October 2022, at this time her nausea and vomiting has been controlled until 2 weeks prior to that visit.  She was on Reglan 2.5 mg twice daily, Zofran 3 times daily, Phenergan daily.  GERD symptoms were well controlled with Protonix 40 mg daily.  Her Reglan was increased to 5 mg twice daily, strict gastroparesis diet recommended, and advised to monitor for medication side effects.  Today: Patient reports she has been doing well.  Denies any dysphagia, GERD symptoms, abdominal pain, constipation, or diarrhea. She reports doing okay with her diet.  She has been following closely with her PCP for help with weight loss. She was recently taken off phentermine, and started Ozempic about 2 weeks ago.  She feels like her weight has gone up recently, and is questioning this change in medication.  She reports this which was made due to concerns of the effect on her kidney function.  She has had continued nausea without vomiting but feels it is well controlled with Zofran 3 times daily, and has only needed Phenergan about 2 times weekly, which is an improvement since October.  She continues to take pantoprazole 40 mg daily, without breakthrough symptoms.  Has been taking Reglan 5 mg twice daily since her last visit in October.  No recent hemoglobin A1c, last was 6.6 in December 2021.  Last glucose on labs from May 28, 2021 was 88.   Past Medical History:  Diagnosis  Date   Anemia of chronic disease    Diabetes ([place])    Gastroparesis    Grave's disease    diagnosed years ago/ now hypothyroidism   Hypertension    IBS (irritable colon syndrome)    constipation predominant    Past Surgical History:  Procedure Laterality Date   ABDOMINAL HYSTERECTOMY     attempted colonoscopy  03/2004   Dr. Rourk--> prep was poor. Exam to 40 cm. No gross abnormalities. Internal hemorrhoids. Air contrast barium enema was normal.   CHOLECYSTECTOMY  2010   COLONOSCOPY N/A 10/30/2014   RMR: normal. next colonoscopy in 2026.   ESOPHAGEAL DILATION N/A 10/30/2014   Procedure: ESOPHAGEAL DILATION;  Surgeon: Daneil Dolin, MD;  Location[facility];  Service: Endoscopy;  Laterality: N/A;   ESOPHAGOGASTRODUODENOSCOPY  01/2004   Dr. Almyra Free small polyps in postbulbar area ablated   ESOPHAGOGASTRODUODENOSCOPY N/A 10/30/2014   GNO:[id number]   ESOPHAGOGASTRODUODENOSCOPY (EGD) WITH PROPOFOL N/A 06/13/2020    Surgeon: Daneil Dolin, MD;  Normal exam   S/P Hysterectomy      TONSILLECTOMY     TUBAL LIGATION      Current Outpatient Medications  Medication Sig Dispense Refill   ammonium lactate (LAC-HYDRIN) 12 % lotion Apply 1 application topically daily. Feet     atorvastatin (LIPITOR) 20 MG tablet Take 20 mg by mouth daily.     blood glucose meter kit and supplies Use to check your  blood sugar three times a day as directed. (FOR ICD-10 E10.9, E11.9). 1 each 0   cetirizine (ZYRTEC) 10 MG tablet Take 10 mg by mouth daily.     estradiol (ESTRACE) 1 MG tablet Take 1 mg by mouth daily.     insulin glargine (LANTUS SOLOSTAR) 100 UNIT/ML Solostar Pen Inject 2 Units into the skin daily as needed (blood glucos is over 150). 15 mL 0   levothyroxine (SYNTHROID) 75 MCG tablet Take 75 mcg by mouth daily before breakfast.     metoCLOPramide (REGLAN) 5 MG tablet TAKE 1/2 (ONE-HALF) TABLET BY MOUTH TWICE DAILY AS DIRECTED 30 tablet 3   metoprolol succinate (TOPROL-XL) 25 MG 24 hr  tablet Take 25 mg by mouth daily.     ondansetron (ZOFRAN-ODT) 4 MG disintegrating tablet DISSOLVE 1 TABLET IN MOUTH EVERY 8 HOURS AS NEEDED 30 tablet 0   pantoprazole (PROTONIX) 40 MG tablet Take 1 tablet (40 mg total) by mouth daily before breakfast. 90 tablet 3   polyethylene glycol powder (GLYCOLAX/MIRALAX) 17 GM/SCOOP powder Take 17 g by mouth daily.     Semaglutide,0.25 or 0.5MG/DOS, (OZEMPIC, 0.25 OR 0.5 MG/DOSE,) 2 MG/1.5ML SOPN Inject 0.25 mg into the skin.     promethazine (PHENERGAN) 12.5 MG tablet TAKE 1 TABLET BY MOUTH EVERY 6 HOURS AS NEEDED FOR NAUSEA FOR VOMITING (Patient not taking: Reported on 07/03/2021) 30 tablet 2   No current facility-administered medications for this visit.    Allergies as of 07/03/2021 - Review Complete 07/03/2021  Allergen Reaction Noted   Codeine Nausea And Vomiting    Latex Itching 07/03/2021    Family History  Problem Relation Age of Onset   Other Father 43       hit by train   Heart disease Mother    Kidney disease Mother    Colon cancer Neg Hx     Social History   Socioeconomic History   Marital status: Single    Spouse name: Not on file   Number of children: 1   Years of education: Not on file   Highest education level: Not on file  Occupational History   Occupation: unemployed since July 3    Employer: [employer]  Tobacco Use   Smoking status: Former    Types: E-cigarettes   Smokeless tobacco: Never  Scientific laboratory technician Use: Never used  Substance and Sexual Activity   Alcohol use: Not Currently    Comment: occ   Drug use: No   Sexual activity: Yes    Birth control/protection: Surgical    Comment: hyst  Other Topics Concern   Not on file  Social History Narrative   1 grown healthy daughter   Social Determinants of Health   Financial Resource Strain: Not on file  Food Insecurity: Not on file  Transportation Needs: Not on file  Physical Activity: Not on file  Stress: Not on file  Social Connections:  Not on file     Review of Systems   Gen: Denies fever, chills, anorexia. Denies fatigue, weakness, weight loss.  CV: Denies chest pain, palpitations, syncope, peripheral edema, and claudication. Resp: Denies dyspnea at rest, cough, wheezing, coughing up blood, and pleurisy. GI: see HPI. Derm: Denies rash, itching, dry skin Psych: Denies depression, anxiety, memory loss, confusion. No homicidal or suicidal ideation.  Heme: Denies bruising, bleeding, and enlarged lymph nodes. Neuro: denies drowsiness, denies involuntary twitching or tremors   Physical Exam   BP 113/64    Pulse 82  Temp (!) 96.9 F (36.1 C) (Temporal)    Ht 5' (1.524 m)    Wt 187 lb 3.2 oz (84.9 kg)    BMI 36.56 kg/m   General:   Alert and oriented. No distress noted. Pleasant and cooperative.  Head:  Normocephalic and atraumatic. Eyes:  Conjuctiva clear without scleral icterus. Mouth:  Oral mucosa pink and moist. Good dentition. No lesions. Lungs:  Clear to auscultation bilaterally. No wheezes, rales, or rhonchi. No distress.  Heart:  S1, S2 present without murmurs appreciated.  Abdomen:  +BS, soft, non-tender and non-distended. No rebound or guarding. No HSM or masses noted. Rectal:  Msk:  Symmetrical without gross deformities. Normal posture. Extremities:  Without edema. Neurologic:  Alert and  oriented x4, absence of tremors. Psych:  Alert and cooperative. Normal mood and affect.   Assessment  SHARNETTA GIELOW is a 56 y.o. female with history of diabetes, HTN, IBS, GERD, gastroparesis presenting today for follow-up of GERD, nausea, and gastroparesis. Last EGD February 2022 was normal.   Reglan increased from 2.5 mg to 5 mg twice daily at last follow-up in October.  She has continued to do well during this time, using Zofran 3 times daily, and only requiring Phenergan about twice weekly. GERD symptoms are well controlled on Protonix 40 mg daily. No recent A1c on file, last glucose on blood draw - 88.  She  has continued weight loss efforts and was recently switched from phentermine to [place]. This has the potential to further delay gastric emptying however she has been doing well thus far.  Advised that we should continue to work toward symptom management with lowest dose of Reglan possible.  Since she has been doing well, she is agreeable to decrease Reglan from 5 mg twice daily back to 2.5 mg twice daily and to monitor for increased symptoms.  Medication side effects discussed again today, although she has never experienced any side effects, advised her to continue to monitor for these.  Advised to continue Zofran 3 times a day as needed, and Phenergan as needed.  If she were to have increase in symptoms and more frequent need for Phenergan, advised her to call or follow-up to the clinic with these concerns.   PLAN   We will decrease Reglan to 2.5 mg twice daily, before breakfast and dinner.  Patient aware of signs and symptoms to look out for, advised to continue to monitor for these and to follow-up immediately if it occurs. Continue to follow gastroparesis diet and weight loss efforts. Continue Protonix 40 mg daily Continue Zofran 3 times a day as needed. Continue Phenergan as needed. Doing well, follow-up in 6 months. Advised to call or follow-up sooner if symptoms worsen.   Venetia Night, MSN, FNP-BC, AGACNP-BC [facility]

## 2021-07-03 ENCOUNTER — Encounter: Payer: Self-pay | Admitting: Gastroenterology

## 2021-07-03 ENCOUNTER — Other Ambulatory Visit: Payer: Self-pay

## 2021-07-03 ENCOUNTER — Ambulatory Visit: Payer: BC Managed Care – PPO | Admitting: Gastroenterology

## 2021-07-03 VITALS — BP 113/64 | HR 82 | Temp 96.9°F | Ht 60.0 in | Wt 187.2 lb

## 2021-07-03 DIAGNOSIS — K3184 Gastroparesis: Secondary | ICD-10-CM

## 2021-07-03 DIAGNOSIS — K219 Gastro-esophageal reflux disease without esophagitis: Secondary | ICD-10-CM | POA: Diagnosis not present

## 2021-07-03 DIAGNOSIS — R110 Nausea: Secondary | ICD-10-CM | POA: Diagnosis not present

## 2021-07-03 NOTE — Patient Instructions (Addendum)
It was a pleasure to meet you today. ? ?As we discussed we will decrease your Reglan to 2.5 mg twice daily and monitor for breakthrough symptoms. Continue zofran as prescribed and phenergan as needed.  ? ?Continue to monitor for side effects, involuntary muscle twitching, dizziness, etc.  ? ?Recommend follow-up in 6 months or sooner if needed. ? ?It was a pleasure to see you today. I want to create trusting relationships with patients. If you receive a survey regarding your visit,  I greatly appreciate you taking time to fill this out on paper or through your MyChart. I value your feedback. ? ?Venetia Night, MSN, FNP-BC, AGACNP-BC ?[facility] ? ? ?

## 2021-07-08 ENCOUNTER — Other Ambulatory Visit: Payer: Self-pay | Admitting: Gastroenterology

## 2021-07-08 DIAGNOSIS — K3184 Gastroparesis: Secondary | ICD-10-CM

## 2021-07-09 NOTE — Telephone Encounter (Signed)
Noted  

## 2021-08-06 ENCOUNTER — Other Ambulatory Visit (HOSPITAL_COMMUNITY)
Admission: RE | Admit: 2021-08-06 | Discharge: 2021-08-06 | Disposition: A | Discharge status: Home / Self Care | Payer: BC Managed Care – PPO | Source: Ambulatory Visit | Attending: Nephrology | Admitting: Nephrology

## 2021-08-06 DIAGNOSIS — E871 Hypo-osmolality and hyponatremia: Secondary | ICD-10-CM | POA: Diagnosis not present

## 2021-08-06 DIAGNOSIS — D638 Anemia in other chronic diseases classified elsewhere: Secondary | ICD-10-CM | POA: Insufficient documentation

## 2021-08-06 DIAGNOSIS — I129 Hypertensive chronic kidney disease with stage 1 through stage 4 chronic kidney disease, or unspecified chronic kidney disease: Secondary | ICD-10-CM | POA: Diagnosis not present

## 2021-08-06 DIAGNOSIS — R809 Proteinuria, unspecified: Secondary | ICD-10-CM | POA: Diagnosis not present

## 2021-08-06 DIAGNOSIS — N041 Nephrotic syndrome with focal and segmental glomerular lesions: Secondary | ICD-10-CM | POA: Diagnosis not present

## 2021-08-06 DIAGNOSIS — E6609 Other obesity due to excess calories: Secondary | ICD-10-CM | POA: Diagnosis not present

## 2021-08-06 DIAGNOSIS — N119 Chronic tubulo-interstitial nephritis, unspecified: Secondary | ICD-10-CM | POA: Diagnosis not present

## 2021-08-06 DIAGNOSIS — N184 Chronic kidney disease, stage 4 (severe): Secondary | ICD-10-CM | POA: Insufficient documentation

## 2021-08-06 LAB — CBC
HCT: 34.3 % — ABNORMAL LOW (ref 36.0–46.0)
Hemoglobin: 10.1 g/dL — ABNORMAL LOW (ref 12.0–15.0)
MCH: 27.4 pg (ref 26.0–34.0)
MCHC: 29.4 g/dL — ABNORMAL LOW (ref 30.0–36.0)
MCV: 93.2 fL (ref 80.0–100.0)
Platelets: 331 10*3/uL (ref 150–400)
RBC: 3.68 MIL/uL — ABNORMAL LOW (ref 3.87–5.11)
RDW: 14.1 % (ref 11.5–15.5)
WBC: 6 10*3/uL (ref 4.0–10.5)
nRBC: 0 % (ref 0.0–0.2)

## 2021-08-06 LAB — FERRITIN: Ferritin: 121 ng/mL (ref 11–307)

## 2021-08-06 LAB — RENAL FUNCTION PANEL
Albumin: 3.9 g/dL (ref 3.5–5.0)
Anion gap: 5 (ref 5–15)
BUN: 19 mg/dL (ref 6–20)
CO2: 26 mmol/L (ref 22–32)
Calcium: 9.1 mg/dL (ref 8.9–10.3)
Chloride: 107 mmol/L (ref 98–111)
Creatinine, Ser: 1.98 mg/dL — ABNORMAL HIGH (ref 0.44–1.00)
GFR, Estimated: 29 mL/min — ABNORMAL LOW (ref >60)
Glucose, Bld: 89 mg/dL (ref 70–99)
Phosphorus: 3.2 mg/dL (ref 2.5–4.6)
Potassium: 4.6 mmol/L (ref 3.5–5.1)
Sodium: 138 mmol/L (ref 135–145)

## 2021-08-06 LAB — IRON AND TIBC
Iron: 42 ug/dL (ref 28–170)
Saturation Ratios: 12 % (ref 10.4–31.8)
TIBC: 350 ug/dL (ref 250–450)
UIBC: 308 ug/dL

## 2021-08-06 LAB — PROTEIN / CREATININE RATIO, URINE
Creatinine, Urine: 334.31 mg/dL
Protein Creatinine Ratio: 0.05 mg/mg{Cre} (ref 0.00–0.15)
Total Protein, Urine: 16 mg/dL

## 2021-08-06 LAB — HEMOGLOBIN A1C
Hgb A1c MFr Bld: 5.8 % — ABNORMAL HIGH (ref 4.8–5.6)
Mean Plasma Glucose: 119.76 mg/dL

## 2021-08-06 LAB — VITAMIN D 25 HYDROXY (VIT D DEFICIENCY, FRACTURES): Vit D, 25-Hydroxy: 58.88 ng/mL (ref 30–100)

## 2021-08-07 LAB — PTH, INTACT AND CALCIUM
Calcium, Total (PTH): 9.1 mg/dL (ref 8.7–10.2)
PTH: 31 pg/mL (ref 15–65)

## 2021-08-13 DIAGNOSIS — R809 Proteinuria, unspecified: Secondary | ICD-10-CM | POA: Diagnosis not present

## 2021-08-13 DIAGNOSIS — N119 Chronic tubulo-interstitial nephritis, unspecified: Secondary | ICD-10-CM | POA: Diagnosis not present

## 2021-08-13 DIAGNOSIS — N184 Chronic kidney disease, stage 4 (severe): Secondary | ICD-10-CM | POA: Diagnosis not present

## 2021-08-13 DIAGNOSIS — N041 Nephrotic syndrome with focal and segmental glomerular lesions: Secondary | ICD-10-CM | POA: Diagnosis not present

## 2021-08-14 ENCOUNTER — Telehealth: Payer: Self-pay | Admitting: Internal Medicine

## 2021-08-14 ENCOUNTER — Encounter: Payer: Self-pay | Admitting: Internal Medicine

## 2021-08-14 ENCOUNTER — Ambulatory Visit (INDEPENDENT_AMBULATORY_CARE_PROVIDER_SITE_OTHER): Payer: BC Managed Care – PPO | Admitting: Internal Medicine

## 2021-08-14 ENCOUNTER — Other Ambulatory Visit (HOSPITAL_COMMUNITY)
Admission: RE | Admit: 2021-08-14 | Discharge: 2021-08-14 | Disposition: A | Discharge status: Home / Self Care | Payer: BC Managed Care – PPO | Source: Ambulatory Visit | Attending: Internal Medicine | Admitting: Internal Medicine

## 2021-08-14 VITALS — BP 122/70 | HR 70 | Ht 60.0 in | Wt 180.8 lb

## 2021-08-14 DIAGNOSIS — Z13220 Encounter for screening for lipoid disorders: Secondary | ICD-10-CM | POA: Diagnosis not present

## 2021-08-14 DIAGNOSIS — R002 Palpitations: Secondary | ICD-10-CM

## 2021-08-14 DIAGNOSIS — I10 Essential (primary) hypertension: Secondary | ICD-10-CM

## 2021-08-14 LAB — LIPID PANEL
Cholesterol: 145 mg/dL (ref 0–200)
HDL: 41 mg/dL (ref >40)
LDL Cholesterol: 87 mg/dL (ref 0–99)
Total CHOL/HDL Ratio: 3.5 RATIO
Triglycerides: 83 mg/dL (ref <150)
VLDL: 17 mg/dL (ref 0–40)

## 2021-08-14 LAB — TSH: TSH: 2.541 u[IU]/mL (ref 0.350–4.500)

## 2021-08-14 NOTE — Progress Notes (Signed)
? ?Cardiology Office Note ? ? ?Date:  08/14/2021  ? ?ID:  SHADE RIVENBARK, DOB 1966-04-07, MRN [id number] ? ?PCP:  Celene Squibb, MD  ?Cardiologist:   Dorris Carnes, MD  ? ?Patient referred for palpitations ? ? ?  ?History of Present Illness: ?[name] [name] is a 56 y.o. female with a history of chest pain ?and palpitations ? ? Chest pain   Sharp, then dull     Occurs while working     Lasts about 10 min  Discomfort will start on R side chest  then goes to L chest   Spells started about 1 year ago   When not having  discomfort she said she feels OK.  No SOB  No CP with other activity  Active at work ? ?2  Palpitations  Epidoses start suddenly    Last about 15 at most   Will have 3x per week  A little dizzy but no syncope   Started about 6 months ago       ? ? ? ? ? ? ? ? ?Current Meds  ?Medication Sig  ? ammonium lactate (LAC-HYDRIN) 12 % lotion Apply 1 application topically daily. Feet  ? atorvastatin (LIPITOR) 20 MG tablet Take 20 mg by mouth daily.  ? blood glucose meter kit and supplies Use to check your blood sugar three times a day as directed. (FOR ICD-10 E10.9, E11.9).  ? cetirizine (ZYRTEC) 10 MG tablet Take 10 mg by mouth daily.  ? estradiol (ESTRACE) 1 MG tablet Take 1 mg by mouth daily.  ? insulin glargine (LANTUS SOLOSTAR) 100 UNIT/ML Solostar Pen Inject 2 Units into the skin daily as needed (blood glucos is over 150).  ? levothyroxine (SYNTHROID) 75 MCG tablet Take 75 mcg by mouth daily before breakfast.  ? metoprolol succinate (TOPROL-XL) 25 MG 24 hr tablet Take 25 mg by mouth daily.  ? ondansetron (ZOFRAN-ODT) 4 MG disintegrating tablet DISSOLVE 1 TABLET IN MOUTH EVERY 8 HOURS AS NEEDED  ? pantoprazole (PROTONIX) 40 MG tablet Take 1 tablet (40 mg total) by mouth daily before breakfast.  ? polyethylene glycol powder (GLYCOLAX/MIRALAX) 17 GM/SCOOP powder Take 17 g by mouth daily.  ? Semaglutide,0.25 or 0.5MG /DOS, (OZEMPIC, 0.25 OR 0.5 MG/DOSE,) 2 MG/1.5ML SOPN Inject 0.25 mg into the skin.   ? ? ? ?Allergies:   Codeine and Latex  ? ?Past Medical History:  ?Diagnosis Date  ? Anemia of chronic disease   ? Diabetes ([place])   ? Gastroparesis   ? Grave's disease   ? diagnosed years ago/ now hypothyroidism  ? Hypertension   ? IBS (irritable colon syndrome)   ? constipation predominant  ? ? ?Past Surgical History:  ?Procedure Laterality Date  ? ABDOMINAL HYSTERECTOMY    ? attempted colonoscopy  03/2004  ? Dr. Rourk--> prep was poor. Exam to 40 cm. No gross abnormalities. Internal hemorrhoids. Air contrast barium enema was normal.  ? CHOLECYSTECTOMY  2010  ? COLONOSCOPY N/A 10/30/2014  ? RMR: normal. next colonoscopy in 2026.  ? ESOPHAGEAL DILATION N/A 10/30/2014  ? Procedure: ESOPHAGEAL DILATION;  Surgeon: Daneil Dolin, MD;  Location[facility];  Service: Endoscopy;  Laterality: N/A;  ? ESOPHAGOGASTRODUODENOSCOPY  01/2004  ? Dr. Almyra Free small polyps in postbulbar area ablated  ? ESOPHAGOGASTRODUODENOSCOPY N/A 10/30/2014  ? EXB:[id number]  ? ESOPHAGOGASTRODUODENOSCOPY (EGD) WITH PROPOFOL N/A 06/13/2020  ?  Surgeon: Daneil Dolin, MD;  Normal exam  ? S/P Hysterectomy     ? TONSILLECTOMY    ?  TUBAL LIGATION    ? ? ? ?Social History:  The patient  reports that she has quit smoking. Her smoking use included e-cigarettes. She has never used smokeless tobacco. She reports that she does not currently use alcohol. She reports that she does not use drugs.  ? ?Family History:  The patient's family history includes Heart disease in her mother; Kidney disease in her mother; Other (age of onset: 67) in her father.  ?- ? ?ROS:  Please see the history of present illness. All other systems are reviewed and  Negative to the above problem except as noted.  ?----------------------------- ? ?PHYSICAL EXAM: ?VS:  BP 122/70   Pulse 70   Ht 5' (1.524 m)   Wt 180 lb 12.8 oz (82 kg)   SpO2 99%   BMI 35.31 kg/m?   ?GEN: Obese 56 yo  in no acute distress  ?HEENT: normal  ?Neck: no JVD, carotid bruits ?Cardiac: RRR; no  murmurs  No LE  edema  ?Respiratory:  clear to auscultation bilaterally, ?GI: soft, nontender, nondistended, + BS  No hepatomegaly  ?MS: no deformity Moving all extremities   ?Skin: warm and dry, no rash ?Neuro:  Strength and sensation are intact ?Psych: euthymic mood, full affect ? ? ?EKG:  EKG is ordered today.  SR 70 bpm  T wave inversion III, AVF ? ? ?Lipid Panel ?   ?Component Value Date/Time  ? CHOL 158 04/03/2020 1457  ? TRIG 68 04/03/2020 1457  ? HDL 41 04/03/2020 1457  ? CHOLHDL 3.9 04/03/2020 1457  ? VLDL 14 04/03/2020 1457  ? [place] 103 (H) 04/03/2020 1457  ? ?  ? ?Wt Readings from Last 3 Encounters:  ?08/14/21 180 lb 12.8 oz (82 kg)  ?07/03/21 187 lb 3.2 oz (84.9 kg)  ?02/09/21 178 lb 9.6 oz (81 kg)  ?  ? ? ?ASSESSMENT AND PLAN: ? ?1  Chest pain   Atypical  I do not think it is cardiac in origin   ? Musculoskeletal   No work up planned ? ?2  Palpitations   Recurrent   Start suddenly   Mild dizziness    ?Will set up for an event monitor   ? ?3  HCM  Discussed diet   LImit carbs, added sugar    ? ?Willl be available as needed  ? ? ? ? ?Current medicines are reviewed at length with the patient today.  The patient does not have concerns regarding medicines. ? ?Signed, ?Dorris Carnes, MD  ?08/14/2021 2:06 PM    ?[facility] ?[address], [place], [place]  [address] ?Phone: [phone]; Fax: [phone]  ? ? ?

## 2021-08-14 NOTE — Patient Instructions (Signed)
Medication Instructions:  ?Your physician recommends that you continue on your current medications as directed. Please refer to the Current Medication list given to you today. ? ?*If you need a refill on your cardiac medications before your next appointment, please call your pharmacy* ? ? ?Lab Work: ?Your physician recommends that you return for lab work in: Today  ? ?If you have labs (blood work) drawn today and your tests are completely normal, you will receive your results only by: ?MyChart Message (if you have MyChart) OR ?A paper copy in the mail ?If you have any lab test that is abnormal or we need to change your treatment, we will call you to review the results. ? ? ?Testing/Procedures: ?Your physician has recommended that you wear an event monitor. Event monitors are medical devices that record the heart?s electrical activity. Doctors most often [place] these monitors to diagnose arrhythmias. Arrhythmias are problems with the speed or rhythm of the heartbeat. The monitor is a small, portable device. You can wear one while you do your normal daily activities. This is usually used to diagnose what is causing palpitations/syncope (passing out). ? ?This will be mailed to your home ? ? ?Follow-Up: ?At [facility], you and your health needs are our priority.  As part of our continuing mission to provide you with exceptional heart care, we have created designated Provider Care Teams.  These Care Teams include your primary Cardiologist (physician) and Advanced Practice Providers (APPs -  Physician Assistants and Nurse Practitioners) who all work together to provide you with the care you need, when you need it. ? ?We recommend signing up for the patient portal called "MyChart".  Sign up information is provided on this After Visit Summary.  MyChart is used to connect with patients for Virtual Visits (Telemedicine).  Patients are able to view lab/test results, encounter notes, upcoming appointments, etc.  Non-urgent  messages can be sent to your provider as well.   ?To learn more about what you can do with MyChart, go to [email or web address].   ? ?Your next appointment:   ? As Needed  ? ?The format for your next appointment:   ?In Person ? ?Provider:   ?Dorris Carnes, MD  ? ? ?Other Instructions ?Thank you for choosing [facility]! ? ? ? ?Important Information About Sugar ? ? ? ? ? ? ?

## 2021-08-14 NOTE — Telephone Encounter (Signed)
PERCERT: ? ?Event monitor 30 days  ?

## 2021-09-02 DIAGNOSIS — Z0001 Encounter for general adult medical examination with abnormal findings: Secondary | ICD-10-CM | POA: Diagnosis not present

## 2021-10-09 ENCOUNTER — Other Ambulatory Visit (HOSPITAL_COMMUNITY)
Admission: RE | Admit: 2021-10-09 | Discharge: 2021-10-09 | Disposition: A | Discharge status: Home / Self Care | Payer: BC Managed Care – PPO | Source: Ambulatory Visit | Attending: Nephrology | Admitting: Nephrology

## 2021-10-09 DIAGNOSIS — D631 Anemia in chronic kidney disease: Secondary | ICD-10-CM | POA: Insufficient documentation

## 2021-10-09 DIAGNOSIS — R809 Proteinuria, unspecified: Secondary | ICD-10-CM | POA: Diagnosis not present

## 2021-10-09 DIAGNOSIS — I129 Hypertensive chronic kidney disease with stage 1 through stage 4 chronic kidney disease, or unspecified chronic kidney disease: Secondary | ICD-10-CM | POA: Diagnosis not present

## 2021-10-09 DIAGNOSIS — N184 Chronic kidney disease, stage 4 (severe): Secondary | ICD-10-CM | POA: Insufficient documentation

## 2021-10-09 DIAGNOSIS — N119 Chronic tubulo-interstitial nephritis, unspecified: Secondary | ICD-10-CM | POA: Insufficient documentation

## 2021-10-09 DIAGNOSIS — N041 Nephrotic syndrome with focal and segmental glomerular lesions: Secondary | ICD-10-CM | POA: Insufficient documentation

## 2021-10-09 LAB — CBC
HCT: 35.5 % — ABNORMAL LOW (ref 36.0–46.0)
Hemoglobin: 10.6 g/dL — ABNORMAL LOW (ref 12.0–15.0)
MCH: 27.5 pg (ref 26.0–34.0)
MCHC: 29.9 g/dL — ABNORMAL LOW (ref 30.0–36.0)
MCV: 92.2 fL (ref 80.0–100.0)
Platelets: 290 10*3/uL (ref 150–400)
RBC: 3.85 MIL/uL — ABNORMAL LOW (ref 3.87–5.11)
RDW: 13.9 % (ref 11.5–15.5)
WBC: 6.6 10*3/uL (ref 4.0–10.5)
nRBC: 0 % (ref 0.0–0.2)

## 2021-10-09 LAB — PROTEIN / CREATININE RATIO, URINE
Creatinine, Urine: 224.71 mg/dL
Protein Creatinine Ratio: 0.04 mg/mg{Cre} (ref 0.00–0.15)
Total Protein, Urine: 9 mg/dL

## 2021-10-09 LAB — RENAL FUNCTION PANEL
Albumin: 3.8 g/dL (ref 3.5–5.0)
Anion gap: 8 (ref 5–15)
BUN: 20 mg/dL (ref 6–20)
CO2: 25 mmol/L (ref 22–32)
Calcium: 9.1 mg/dL (ref 8.9–10.3)
Chloride: 104 mmol/L (ref 98–111)
Creatinine, Ser: 1.97 mg/dL — ABNORMAL HIGH (ref 0.44–1.00)
GFR, Estimated: 29 mL/min — ABNORMAL LOW (ref >60)
Glucose, Bld: 90 mg/dL (ref 70–99)
Phosphorus: 3.8 mg/dL (ref 2.5–4.6)
Potassium: 3.7 mmol/L (ref 3.5–5.1)
Sodium: 137 mmol/L (ref 135–145)

## 2021-10-14 ENCOUNTER — Other Ambulatory Visit: Payer: Self-pay | Admitting: Gastroenterology

## 2021-10-14 DIAGNOSIS — K3184 Gastroparesis: Secondary | ICD-10-CM

## 2021-10-15 DIAGNOSIS — N119 Chronic tubulo-interstitial nephritis, unspecified: Secondary | ICD-10-CM | POA: Diagnosis not present

## 2021-10-15 DIAGNOSIS — N184 Chronic kidney disease, stage 4 (severe): Secondary | ICD-10-CM | POA: Diagnosis not present

## 2021-10-15 DIAGNOSIS — I129 Hypertensive chronic kidney disease with stage 1 through stage 4 chronic kidney disease, or unspecified chronic kidney disease: Secondary | ICD-10-CM | POA: Diagnosis not present

## 2021-10-15 DIAGNOSIS — N041 Nephrotic syndrome with focal and segmental glomerular lesions: Secondary | ICD-10-CM | POA: Diagnosis not present

## 2021-10-16 DIAGNOSIS — M722 Plantar fascial fibromatosis: Secondary | ICD-10-CM | POA: Diagnosis not present

## 2021-11-03 ENCOUNTER — Other Ambulatory Visit: Payer: Self-pay | Admitting: Gastroenterology

## 2021-11-03 DIAGNOSIS — K3184 Gastroparesis: Secondary | ICD-10-CM

## 2021-11-30 DIAGNOSIS — E039 Hypothyroidism, unspecified: Secondary | ICD-10-CM | POA: Diagnosis not present

## 2021-11-30 DIAGNOSIS — M2550 Pain in unspecified joint: Secondary | ICD-10-CM | POA: Diagnosis not present

## 2021-11-30 DIAGNOSIS — E782 Mixed hyperlipidemia: Secondary | ICD-10-CM | POA: Diagnosis not present

## 2021-12-08 ENCOUNTER — Other Ambulatory Visit: Payer: Self-pay | Admitting: Gastroenterology

## 2021-12-08 DIAGNOSIS — K3184 Gastroparesis: Secondary | ICD-10-CM

## 2021-12-18 ENCOUNTER — Other Ambulatory Visit (HOSPITAL_COMMUNITY)
Admission: RE | Admit: 2021-12-18 | Discharge: 2021-12-18 | Disposition: A | Discharge status: Home / Self Care | Payer: BC Managed Care – PPO | Source: Ambulatory Visit | Attending: Nephrology | Admitting: Nephrology

## 2021-12-18 DIAGNOSIS — R809 Proteinuria, unspecified: Secondary | ICD-10-CM | POA: Diagnosis not present

## 2021-12-18 DIAGNOSIS — N184 Chronic kidney disease, stage 4 (severe): Secondary | ICD-10-CM | POA: Diagnosis not present

## 2021-12-18 DIAGNOSIS — D638 Anemia in other chronic diseases classified elsewhere: Secondary | ICD-10-CM | POA: Insufficient documentation

## 2021-12-18 DIAGNOSIS — N119 Chronic tubulo-interstitial nephritis, unspecified: Secondary | ICD-10-CM | POA: Diagnosis not present

## 2021-12-18 DIAGNOSIS — Z6834 Body mass index (BMI) 34.0-34.9, adult: Secondary | ICD-10-CM | POA: Insufficient documentation

## 2021-12-18 DIAGNOSIS — N041 Nephrotic syndrome with focal and segmental glomerular lesions: Secondary | ICD-10-CM | POA: Insufficient documentation

## 2021-12-18 DIAGNOSIS — I129 Hypertensive chronic kidney disease with stage 1 through stage 4 chronic kidney disease, or unspecified chronic kidney disease: Secondary | ICD-10-CM | POA: Insufficient documentation

## 2021-12-18 LAB — RENAL FUNCTION PANEL
Albumin: 3.8 g/dL (ref 3.5–5.0)
Anion gap: 6 (ref 5–15)
BUN: 16 mg/dL (ref 6–20)
CO2: 26 mmol/L (ref 22–32)
Calcium: 9.1 mg/dL (ref 8.9–10.3)
Chloride: 106 mmol/L (ref 98–111)
Creatinine, Ser: 2.18 mg/dL — ABNORMAL HIGH (ref 0.44–1.00)
GFR, Estimated: 26 mL/min — ABNORMAL LOW (ref >60)
Glucose, Bld: 86 mg/dL (ref 70–99)
Phosphorus: 3.6 mg/dL (ref 2.5–4.6)
Potassium: 4.2 mmol/L (ref 3.5–5.1)
Sodium: 138 mmol/L (ref 135–145)

## 2021-12-18 LAB — CBC
HCT: 35.8 % — ABNORMAL LOW (ref 36.0–46.0)
Hemoglobin: 10.9 g/dL — ABNORMAL LOW (ref 12.0–15.0)
MCH: 27.9 pg (ref 26.0–34.0)
MCHC: 30.4 g/dL (ref 30.0–36.0)
MCV: 91.6 fL (ref 80.0–100.0)
Platelets: 291 10*3/uL (ref 150–400)
RBC: 3.91 MIL/uL (ref 3.87–5.11)
RDW: 13.6 % (ref 11.5–15.5)
WBC: 6 10*3/uL (ref 4.0–10.5)
nRBC: 0 % (ref 0.0–0.2)

## 2021-12-18 LAB — PROTEIN / CREATININE RATIO, URINE
Creatinine, Urine: 166.56 mg/dL
Total Protein, Urine: <3 mg/dL

## 2021-12-24 DIAGNOSIS — N2581 Secondary hyperparathyroidism of renal origin: Secondary | ICD-10-CM | POA: Diagnosis not present

## 2021-12-24 DIAGNOSIS — I129 Hypertensive chronic kidney disease with stage 1 through stage 4 chronic kidney disease, or unspecified chronic kidney disease: Secondary | ICD-10-CM | POA: Diagnosis not present

## 2021-12-24 DIAGNOSIS — N184 Chronic kidney disease, stage 4 (severe): Secondary | ICD-10-CM | POA: Diagnosis not present

## 2021-12-24 DIAGNOSIS — D631 Anemia in chronic kidney disease: Secondary | ICD-10-CM | POA: Diagnosis not present

## 2021-12-29 ENCOUNTER — Other Ambulatory Visit: Payer: Self-pay | Admitting: Gastroenterology

## 2021-12-29 ENCOUNTER — Ambulatory Visit: Payer: BC Managed Care – PPO | Admitting: Gastroenterology

## 2021-12-29 DIAGNOSIS — M25562 Pain in left knee: Secondary | ICD-10-CM | POA: Diagnosis not present

## 2021-12-29 DIAGNOSIS — K3184 Gastroparesis: Secondary | ICD-10-CM

## 2021-12-29 DIAGNOSIS — M542 Cervicalgia: Secondary | ICD-10-CM | POA: Diagnosis not present

## 2021-12-29 DIAGNOSIS — M79641 Pain in right hand: Secondary | ICD-10-CM | POA: Diagnosis not present

## 2021-12-29 DIAGNOSIS — M79642 Pain in left hand: Secondary | ICD-10-CM | POA: Diagnosis not present

## 2021-12-29 DIAGNOSIS — M549 Dorsalgia, unspecified: Secondary | ICD-10-CM | POA: Diagnosis not present

## 2021-12-29 DIAGNOSIS — N189 Chronic kidney disease, unspecified: Secondary | ICD-10-CM | POA: Diagnosis not present

## 2021-12-29 DIAGNOSIS — M1990 Unspecified osteoarthritis, unspecified site: Secondary | ICD-10-CM | POA: Diagnosis not present

## 2021-12-29 DIAGNOSIS — M359 Systemic involvement of connective tissue, unspecified: Secondary | ICD-10-CM | POA: Diagnosis not present

## 2021-12-29 DIAGNOSIS — M25561 Pain in right knee: Secondary | ICD-10-CM | POA: Diagnosis not present

## 2022-01-18 DIAGNOSIS — E039 Hypothyroidism, unspecified: Secondary | ICD-10-CM | POA: Diagnosis not present

## 2022-01-18 DIAGNOSIS — M2550 Pain in unspecified joint: Secondary | ICD-10-CM | POA: Diagnosis not present

## 2022-01-18 DIAGNOSIS — E118 Type 2 diabetes mellitus with unspecified complications: Secondary | ICD-10-CM | POA: Diagnosis not present

## 2022-01-18 DIAGNOSIS — E782 Mixed hyperlipidemia: Secondary | ICD-10-CM | POA: Diagnosis not present

## 2022-01-21 ENCOUNTER — Other Ambulatory Visit (HOSPITAL_COMMUNITY)
Admission: RE | Admit: 2022-01-21 | Discharge: 2022-01-21 | Disposition: A | Discharge status: Home / Self Care | Payer: BC Managed Care – PPO | Source: Ambulatory Visit | Attending: Nephrology | Admitting: Nephrology

## 2022-01-21 DIAGNOSIS — D638 Anemia in other chronic diseases classified elsewhere: Secondary | ICD-10-CM | POA: Insufficient documentation

## 2022-01-21 DIAGNOSIS — I129 Hypertensive chronic kidney disease with stage 1 through stage 4 chronic kidney disease, or unspecified chronic kidney disease: Secondary | ICD-10-CM | POA: Insufficient documentation

## 2022-01-21 DIAGNOSIS — R809 Proteinuria, unspecified: Secondary | ICD-10-CM | POA: Diagnosis not present

## 2022-01-21 DIAGNOSIS — N041 Nephrotic syndrome with focal and segmental glomerular lesions: Secondary | ICD-10-CM | POA: Insufficient documentation

## 2022-01-21 DIAGNOSIS — N184 Chronic kidney disease, stage 4 (severe): Secondary | ICD-10-CM | POA: Diagnosis not present

## 2022-01-21 DIAGNOSIS — N119 Chronic tubulo-interstitial nephritis, unspecified: Secondary | ICD-10-CM | POA: Insufficient documentation

## 2022-01-21 LAB — CBC
HCT: 36.6 % (ref 36.0–46.0)
Hemoglobin: 11.1 g/dL — ABNORMAL LOW (ref 12.0–15.0)
MCH: 28 pg (ref 26.0–34.0)
MCHC: 30.3 g/dL (ref 30.0–36.0)
MCV: 92.2 fL (ref 80.0–100.0)
Platelets: 261 10*3/uL (ref 150–400)
RBC: 3.97 MIL/uL (ref 3.87–5.11)
RDW: 13.8 % (ref 11.5–15.5)
WBC: 5.3 10*3/uL (ref 4.0–10.5)
nRBC: 0 % (ref 0.0–0.2)

## 2022-01-21 LAB — RENAL FUNCTION PANEL
Albumin: 3.7 g/dL (ref 3.5–5.0)
Anion gap: 6 (ref 5–15)
BUN: 18 mg/dL (ref 6–20)
CO2: 25 mmol/L (ref 22–32)
Calcium: 8.7 mg/dL — ABNORMAL LOW (ref 8.9–10.3)
Chloride: 105 mmol/L (ref 98–111)
Creatinine, Ser: 2.02 mg/dL — ABNORMAL HIGH (ref 0.44–1.00)
GFR, Estimated: 28 mL/min — ABNORMAL LOW (ref >60)
Glucose, Bld: 92 mg/dL (ref 70–99)
Phosphorus: 2.8 mg/dL (ref 2.5–4.6)
Potassium: 4.3 mmol/L (ref 3.5–5.1)
Sodium: 136 mmol/L (ref 135–145)

## 2022-01-21 LAB — PROTEIN / CREATININE RATIO, URINE
Creatinine, Urine: 179.54 mg/dL
Protein Creatinine Ratio: 0.07 mg/mg{Cre} (ref 0.00–0.15)
Total Protein, Urine: 12 mg/dL

## 2022-01-21 NOTE — Progress Notes (Unsigned)
GI Office Note    Referring Provider: Celene Squibb, MD Primary Care Physician:  Celene Squibb, MD  Primary Gastroenterologist:  Chief Complaint   No chief complaint on file.   History of Present Illness   [name] [name] is a 56 y.o. female presenting today for follow up. Last seen in 06/2021. She has history of IBS, fatty liver, GERD, gastroparesis.      BM two per day.    Taking zofran three times per day.  Lab Results  Component Value Date   ALT 41 04/03/2020   AST 31 04/03/2020   ALKPHOS 113 04/03/2020   BILITOT 0.9 04/03/2020      Medications   Current Outpatient Medications  Medication Sig Dispense Refill   ammonium lactate (LAC-HYDRIN) 12 % lotion Apply 1 application topically daily. Feet     atorvastatin (LIPITOR) 20 MG tablet Take 20 mg by mouth daily.     blood glucose meter kit and supplies Use to check your blood sugar three times a day as directed. (FOR ICD-10 E10.9, E11.9). 1 each 0   cetirizine (ZYRTEC) 10 MG tablet Take 10 mg by mouth daily.     estradiol (ESTRACE) 1 MG tablet Take 1 mg by mouth daily.     FEROSUL 325 (65 Fe) MG tablet Take 325 mg by mouth every morning.     levothyroxine (SYNTHROID) 75 MCG tablet Take 75 mcg by mouth daily before breakfast.     metoprolol succinate (TOPROL-XL) 25 MG 24 hr tablet Take 25 mg by mouth daily.     mycophenolate (CELLCEPT) 500 MG tablet Take 500 mg by mouth 2 (two) times daily.     ondansetron (ZOFRAN-ODT) 4 MG disintegrating tablet DISSOLVE 1 TABLET IN MOUTH EVERY 8 HOURS AS NEEDED 30 tablet 0   pantoprazole (PROTONIX) 40 MG tablet Take 1 tablet (40 mg total) by mouth daily before breakfast. 90 tablet 3   polyethylene glycol powder (GLYCOLAX/MIRALAX) 17 GM/SCOOP powder Take 17 g by mouth daily.     valsartan (DIOVAN) 40 MG tablet Take 40 mg by mouth daily.     No current facility-administered medications for this visit.    Allergies   Allergies as of 01/22/2022 - Review Complete 08/14/2021   Allergen Reaction Noted   Codeine Nausea And Vomiting    Latex Itching 07/03/2021     Past Medical History   Past Medical History:  Diagnosis Date   Anemia of chronic disease    Diabetes ([place])    Gastroparesis    Grave's disease    diagnosed years ago/ now hypothyroidism   Hypertension    IBS (irritable colon syndrome)    constipation predominant    Past Surgical History   Past Surgical History:  Procedure Laterality Date   ABDOMINAL HYSTERECTOMY     attempted colonoscopy  03/2004   Dr. Rourk--> prep was poor. Exam to 40 cm. No gross abnormalities. Internal hemorrhoids. Air contrast barium enema was normal.   CHOLECYSTECTOMY  2010   COLONOSCOPY N/A 10/30/2014   RMR: normal. next colonoscopy in 2026.   ESOPHAGEAL DILATION N/A 10/30/2014   Procedure: ESOPHAGEAL DILATION;  Surgeon: Daneil Dolin, MD;  [facility];  Service: Endoscopy;  Laterality: N/A;   ESOPHAGOGASTRODUODENOSCOPY  01/2004   Dr. Almyra Free small polyps in postbulbar area ablated   ESOPHAGOGASTRODUODENOSCOPY N/A 10/30/2014   [id number]   ESOPHAGOGASTRODUODENOSCOPY (EGD) WITH PROPOFOL N/A 06/13/2020    Surgeon: Daneil Dolin, MD;  Normal exam  S/P Hysterectomy      TONSILLECTOMY     TUBAL LIGATION      Past Family History   Family History  Problem Relation Age of Onset   Other Father 48       hit by train   Heart disease Mother    Kidney disease Mother    Colon cancer Neg Hx     Past Social History   Social History   Socioeconomic History   Marital status: Single    Spouse name: Not on file   Number of children: 1   Years of education: Not on file   Highest education level: Not on file  Occupational History   Occupation: unemployed since July 3    Employer: [employer]  Tobacco Use   Smoking status: Former    Types: E-cigarettes   Smokeless tobacco: Never  Scientific laboratory technician Use: Never used  Substance and Sexual Activity   Alcohol use: Not Currently     Comment: occ   Drug use: No   Sexual activity: Yes    Birth control/protection: Surgical    Comment: hyst  Other Topics Concern   Not on file  Social History Narrative   1 grown healthy daughter   Social Determinants of Health   Financial Resource Strain: Not on file  Food Insecurity: Not on file  Transportation Needs: Not on file  Physical Activity: Not on file  Stress: Not on file  Social Connections: Not on file  Intimate Partner Violence: Not on file    Review of Systems   General: Negative for anorexia, weight loss, fever, chills, fatigue, weakness. ENT: Negative for hoarseness, difficulty swallowing , nasal congestion. CV: Negative for chest pain, angina, palpitations, dyspnea on exertion, peripheral edema.  Respiratory: Negative for dyspnea at rest, dyspnea on exertion, cough, sputum, wheezing.  GI: See history of present illness. GU:  Negative for dysuria, hematuria, urinary incontinence, urinary frequency, nocturnal urination.  Endo: Negative for unusual weight change.     Physical Exam   There were no vitals taken for this visit.   General: Well-nourished, well-developed in no acute distress.  Eyes: No icterus. Mouth: Oropharyngeal mucosa moist and pink , no lesions erythema or exudate. Lungs: Clear to auscultation bilaterally.  Heart: Regular rate and rhythm, no murmurs rubs or gallops.  Abdomen: Bowel sounds are normal, nontender, nondistended, no hepatosplenomegaly or masses,  no abdominal bruits or hernia , no rebound or guarding.  Rectal: ***  Extremities: No lower extremity edema. No clubbing or deformities. Neuro: Alert and oriented x 4   Skin: Warm and dry, no jaundice.   Psych: Alert and cooperative, normal mood and affect.  Labs   *** Imaging Studies   No results found.  Assessment       PLAN   *** Make sure nothing else to cover like liver or anemia.  Laureen Ochs. Bobby Rumpf, [place], [facility]

## 2022-01-22 ENCOUNTER — Encounter: Payer: Self-pay | Admitting: Gastroenterology

## 2022-01-22 ENCOUNTER — Ambulatory Visit (INDEPENDENT_AMBULATORY_CARE_PROVIDER_SITE_OTHER): Payer: BC Managed Care – PPO | Admitting: Gastroenterology

## 2022-01-22 VITALS — BP 118/76 | HR 79 | Temp 97.5°F | Ht 61.0 in | Wt 177.4 lb

## 2022-01-22 DIAGNOSIS — K589 Irritable bowel syndrome without diarrhea: Secondary | ICD-10-CM | POA: Diagnosis not present

## 2022-01-22 DIAGNOSIS — K219 Gastro-esophageal reflux disease without esophagitis: Secondary | ICD-10-CM

## 2022-01-22 DIAGNOSIS — K3184 Gastroparesis: Secondary | ICD-10-CM

## 2022-01-22 MED ORDER — ONDANSETRON 4 MG PO TBDP: ORAL_TABLET | ORAL | 3 refills | Status: DC

## 2022-01-22 NOTE — Patient Instructions (Signed)
Continue pantoprazole once daily before a meal. Continue miralax daily as needed for constipation. I have refilled your zofran. Try to take only when you really need it for significant nausea. At other times, try to sip on liquid or eat light snack to see if you can settle your stomach first.  We will see you back in six months or sooner if needed.

## 2022-01-27 DIAGNOSIS — N2581 Secondary hyperparathyroidism of renal origin: Secondary | ICD-10-CM | POA: Diagnosis not present

## 2022-01-27 DIAGNOSIS — N184 Chronic kidney disease, stage 4 (severe): Secondary | ICD-10-CM | POA: Diagnosis not present

## 2022-01-27 DIAGNOSIS — D631 Anemia in chronic kidney disease: Secondary | ICD-10-CM | POA: Diagnosis not present

## 2022-01-27 DIAGNOSIS — I129 Hypertensive chronic kidney disease with stage 1 through stage 4 chronic kidney disease, or unspecified chronic kidney disease: Secondary | ICD-10-CM | POA: Diagnosis not present

## 2022-02-08 DIAGNOSIS — M2550 Pain in unspecified joint: Secondary | ICD-10-CM | POA: Diagnosis not present

## 2022-02-08 DIAGNOSIS — E118 Type 2 diabetes mellitus with unspecified complications: Secondary | ICD-10-CM | POA: Diagnosis not present

## 2022-02-08 DIAGNOSIS — R519 Headache, unspecified: Secondary | ICD-10-CM | POA: Diagnosis not present

## 2022-02-10 DIAGNOSIS — M722 Plantar fascial fibromatosis: Secondary | ICD-10-CM | POA: Diagnosis not present

## 2022-02-15 DIAGNOSIS — M722 Plantar fascial fibromatosis: Secondary | ICD-10-CM | POA: Diagnosis not present

## 2022-02-15 DIAGNOSIS — G8918 Other acute postprocedural pain: Secondary | ICD-10-CM | POA: Diagnosis not present

## 2022-02-15 DIAGNOSIS — D1779 Benign lipomatous neoplasm of other sites: Secondary | ICD-10-CM | POA: Diagnosis not present

## 2022-02-15 DIAGNOSIS — D2371 Other benign neoplasm of skin of right lower limb, including hip: Secondary | ICD-10-CM | POA: Diagnosis not present

## 2022-02-15 DIAGNOSIS — R2241 Localized swelling, mass and lump, right lower limb: Secondary | ICD-10-CM | POA: Diagnosis not present

## 2022-03-03 DIAGNOSIS — R2241 Localized swelling, mass and lump, right lower limb: Secondary | ICD-10-CM | POA: Diagnosis not present

## 2022-03-03 DIAGNOSIS — M792 Neuralgia and neuritis, unspecified: Secondary | ICD-10-CM | POA: Diagnosis not present

## 2022-03-24 DIAGNOSIS — E1165 Type 2 diabetes mellitus with hyperglycemia: Secondary | ICD-10-CM | POA: Diagnosis not present

## 2022-03-24 DIAGNOSIS — E039 Hypothyroidism, unspecified: Secondary | ICD-10-CM | POA: Diagnosis not present

## 2022-03-24 DIAGNOSIS — I10 Essential (primary) hypertension: Secondary | ICD-10-CM | POA: Diagnosis not present

## 2022-03-30 ENCOUNTER — Other Ambulatory Visit (HOSPITAL_COMMUNITY): Payer: Self-pay | Admitting: Family Medicine

## 2022-03-30 DIAGNOSIS — E669 Obesity, unspecified: Secondary | ICD-10-CM | POA: Diagnosis not present

## 2022-03-30 DIAGNOSIS — Z1231 Encounter for screening mammogram for malignant neoplasm of breast: Secondary | ICD-10-CM

## 2022-03-30 DIAGNOSIS — R002 Palpitations: Secondary | ICD-10-CM | POA: Diagnosis not present

## 2022-03-30 DIAGNOSIS — M2550 Pain in unspecified joint: Secondary | ICD-10-CM | POA: Diagnosis not present

## 2022-03-30 DIAGNOSIS — E118 Type 2 diabetes mellitus with unspecified complications: Secondary | ICD-10-CM | POA: Diagnosis not present

## 2022-05-07 DIAGNOSIS — N184 Chronic kidney disease, stage 4 (severe): Secondary | ICD-10-CM | POA: Diagnosis not present

## 2022-05-07 DIAGNOSIS — D631 Anemia in chronic kidney disease: Secondary | ICD-10-CM | POA: Diagnosis not present

## 2022-05-07 DIAGNOSIS — N119 Chronic tubulo-interstitial nephritis, unspecified: Secondary | ICD-10-CM | POA: Diagnosis not present

## 2022-05-07 DIAGNOSIS — I129 Hypertensive chronic kidney disease with stage 1 through stage 4 chronic kidney disease, or unspecified chronic kidney disease: Secondary | ICD-10-CM | POA: Diagnosis not present

## 2022-06-08 IMAGING — US US ABDOMEN COMPLETE
1 series · 13 of 25 positions shown · non-contrast
Comparison: None.

CLINICAL DATA: Elevated LFTs.

EXAM:
ABDOMEN ULTRASOUND COMPLETE

[Series 1: us abdomen complete · 13 of 84 slices shown]
[im 1/84]
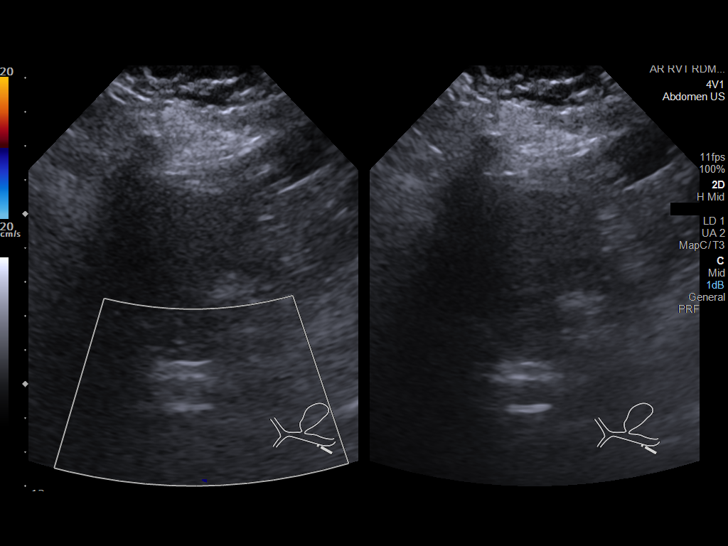
[im 7/84]
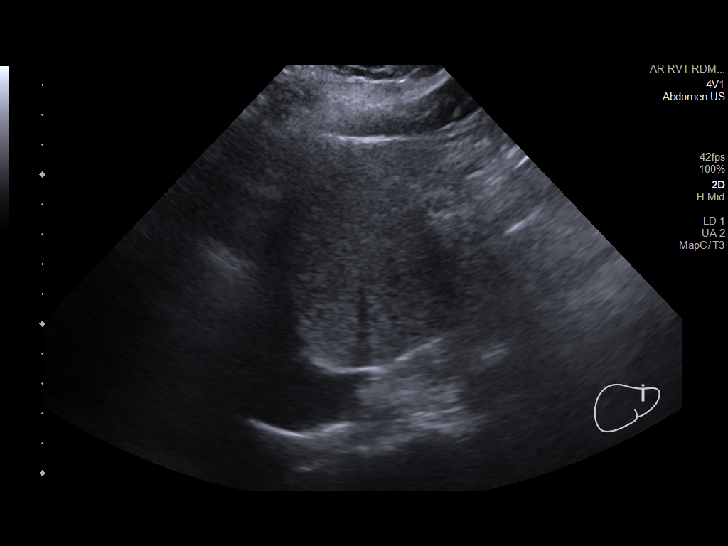
[im 14/84]
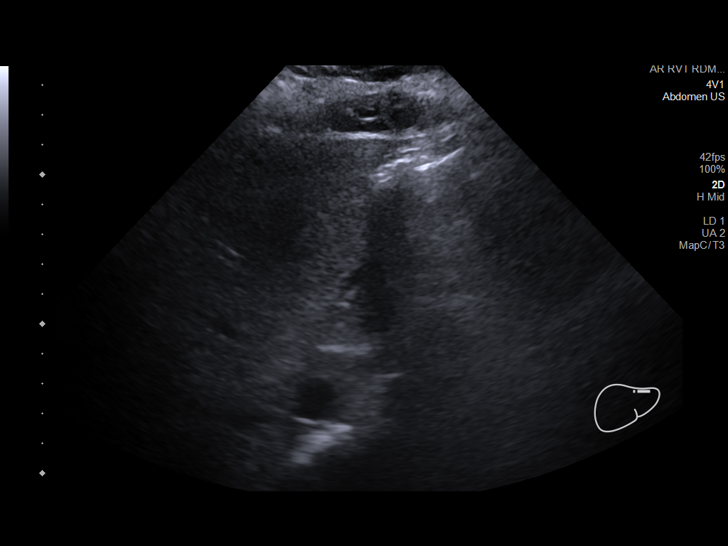
[im 21/84]
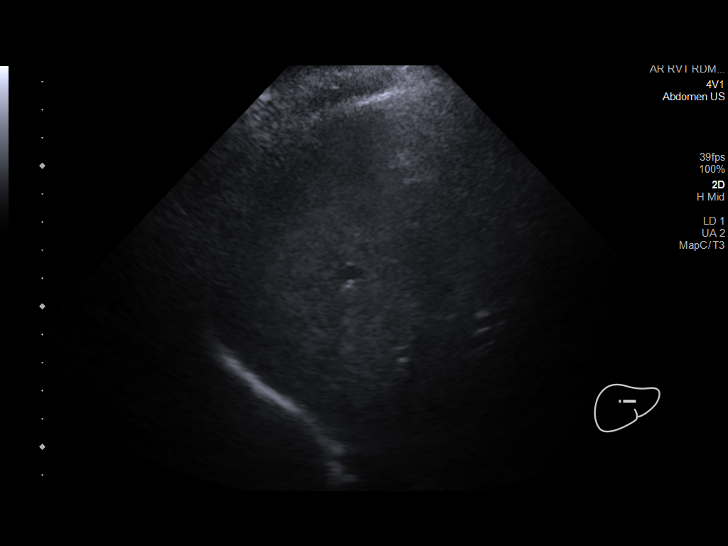
[im 28/84]
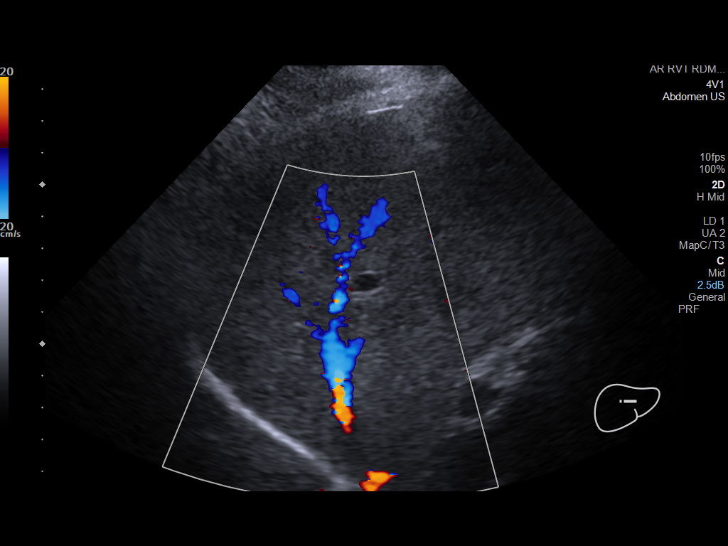
[im 35/84]
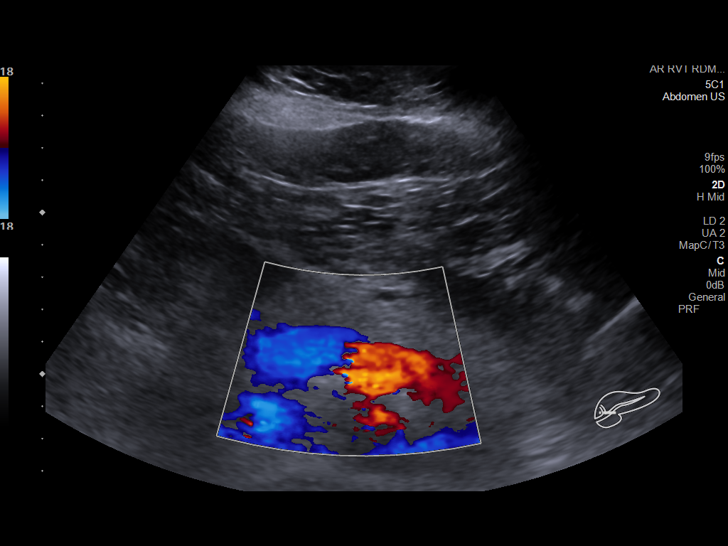
[im 42/84]
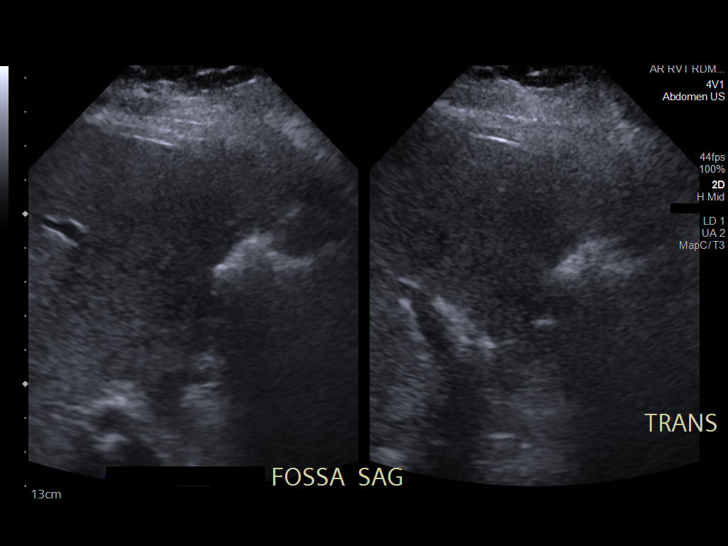
[im 49/84]
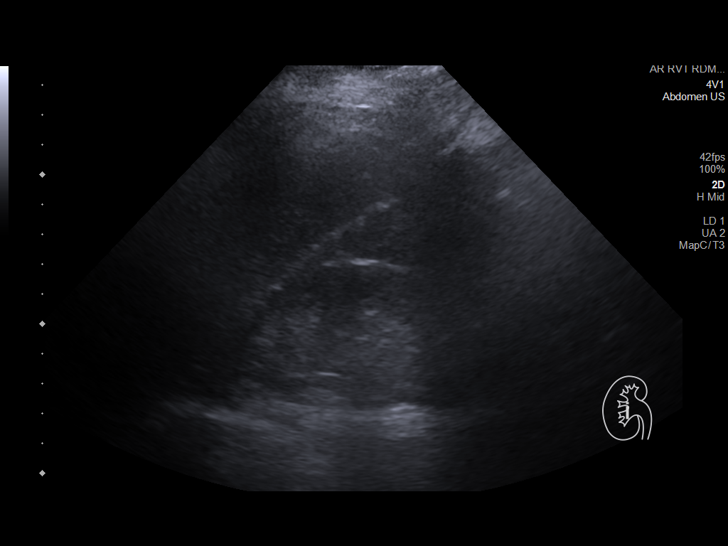
[im 56/84]
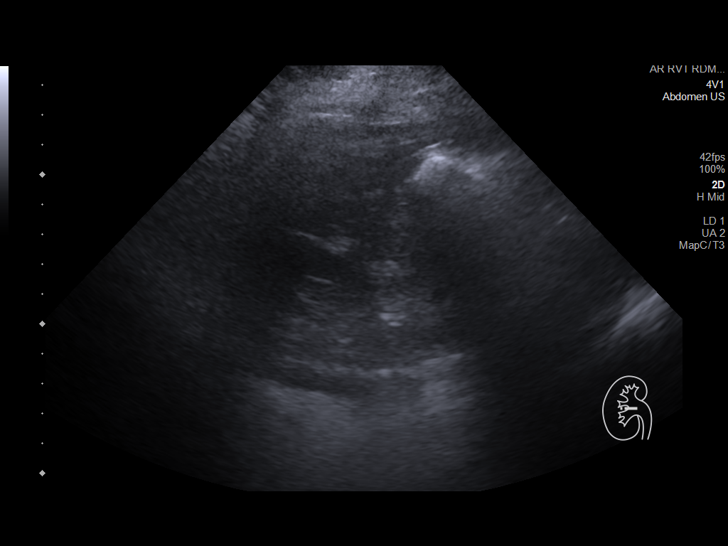
[im 63/84]
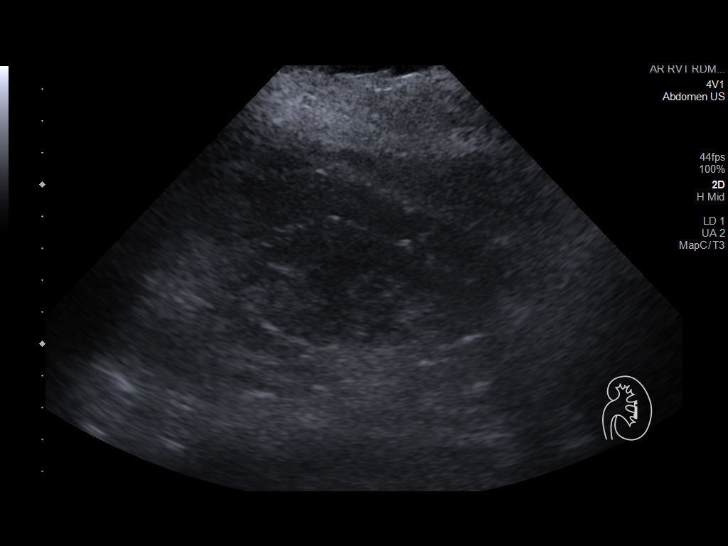
[im 70/84]
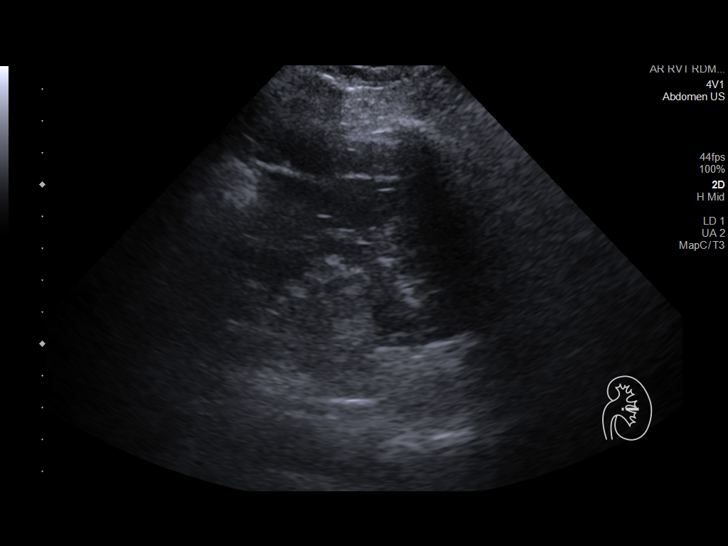
[im 77/84]
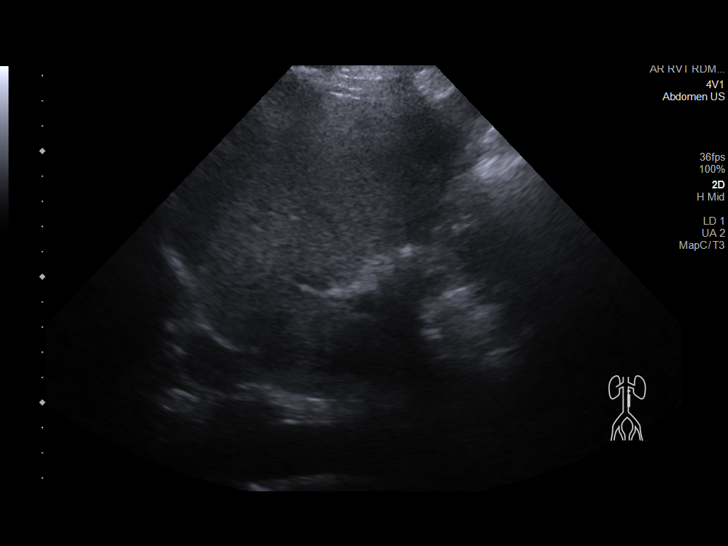
[im 84/84]
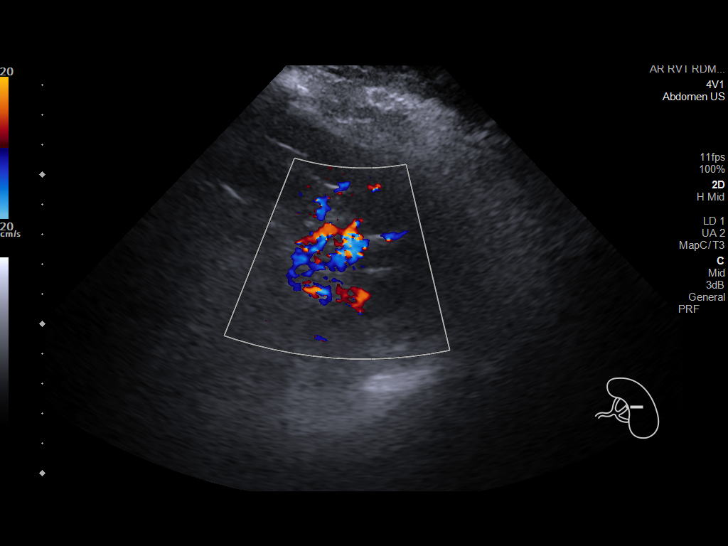

[13 of 25 positions shown; findings below may reference images not displayed]

FINDINGS: Gallbladder: Surgically absent.

Common bile duct: Diameter: 3 mm, normal.

Liver: Heterogeneously increased parenchymal echogenicity. No
discrete focal lesion. No capsular nodularity. Portal vein is patent
on color Doppler imaging with normal direction of blood flow towards
the liver.

IVC: No abnormality visualized.

Pancreas: Technically limited evaluation. Visualized portions
unremarkable.

Spleen: Size and appearance within normal limits.

Right Kidney: Length: 9.7 cm. Technically limited evaluation due to
habitus. Echogenicity within normal limits. Mild parenchymal
thinning. No hydronephrosis or obvious mass.

Left Kidney: Length: 9.4 cm. Technically limited evaluation due to
habitus. Echogenicity within normal limits. Mild parenchymal
thinning. No hydronephrosis or obvious mass.

Abdominal aorta: No aneurysm visualized.

Other findings: No abdominal ascites.
IMPRESSION: 1. Heterogeneously increased hepatic parenchymal echogenicity
typical of steatosis. No discrete focal lesion.
2. Post cholecystectomy without biliary dilatation.
3. Mild bilateral renal parenchymal thinning.

## 2022-06-10 DIAGNOSIS — M792 Neuralgia and neuritis, unspecified: Secondary | ICD-10-CM | POA: Diagnosis not present

## 2022-06-29 NOTE — Progress Notes (Deleted)
Primary Care Physician:  Celene Squibb, MD Primary GI:  ***   Patient Location: Home  Provider Location: [place] office  Reason for Visit: ***  Persons present on the virtual encounter, with roles: Patient, myself (provider),***(updated meds and allergies)  Total time (minutes) spent on medical discussion: ***  Due to COVID-19, visit was conducted using *** method.  Visit was requested by patient.  Virtual Visit via ***  I connected with [name] [name] on 06/29/22 at 11:00 AM EST by *** and verified that I am speaking with the correct person using two identifiers.   I discussed the limitations, risks, security and privacy concerns of performing an evaluation and management service by telephone/video and the availability of in person appointments. I also discussed with the patient that there may be a patient responsible charge related to this service. The patient expressed understanding and agreed to proceed.   HPI:   [name] [name] is a 57 y.o. female who presents for virtual visit regarding ***  Last seen in 12/2021. H/o IBS, fatty liver, GERD, gastroparesis.    EGD 05/2020: -normal exam Current Outpatient Medications  Medication Sig Dispense Refill   ammonium lactate (LAC-HYDRIN) 12 % lotion Apply 1 application topically daily. Feet     atorvastatin (LIPITOR) 20 MG tablet Take 20 mg by mouth daily.     blood glucose meter kit and supplies Use to check your blood sugar three times a day as directed. (FOR ICD-10 E10.9, E11.9). 1 each 0   cetirizine (ZYRTEC) 10 MG tablet Take 10 mg by mouth daily.     estradiol (ESTRACE) 1 MG tablet Take 1 mg by mouth daily.     FEROSUL 325 (65 Fe) MG tablet Take 325 mg by mouth every morning.     levothyroxine (SYNTHROID) 75 MCG tablet Take 75 mcg by mouth daily before breakfast.     metoprolol succinate (TOPROL-XL) 25 MG 24 hr tablet Take 25 mg by mouth daily.     mycophenolate (CELLCEPT) 500 MG tablet Take 500 mg by mouth 2 (two) times  daily.     ondansetron (ZOFRAN-ODT) 4 MG disintegrating tablet DISSOLVE 1 TABLET IN MOUTH EVERY 8 HOURS AS NEEDED 90 tablet 3   pantoprazole (PROTONIX) 40 MG tablet Take 1 tablet (40 mg total) by mouth daily before breakfast. 90 tablet 3   polyethylene glycol powder (GLYCOLAX/MIRALAX) 17 GM/SCOOP powder Take 17 g by mouth daily.     valsartan (DIOVAN) 40 MG tablet Take 40 mg by mouth daily.     No current facility-administered medications for this visit.    Past Medical History:  Diagnosis Date   Anemia of chronic disease    Diabetes ([place])    Gastroparesis    Grave's disease    diagnosed years ago/ now hypothyroidism   Hypertension    IBS (irritable colon syndrome)    constipation predominant    Past Surgical History:  Procedure Laterality Date   ABDOMINAL HYSTERECTOMY     attempted colonoscopy  03/2004   Dr. Rourk--> prep was poor. Exam to 40 cm. No gross abnormalities. Internal hemorrhoids. Air contrast barium enema was normal.   CHOLECYSTECTOMY  2010   COLONOSCOPY N/A 10/30/2014   RMR: normal. next colonoscopy in 2026.   ESOPHAGEAL DILATION N/A 10/30/2014   Procedure: ESOPHAGEAL DILATION;  Surgeon: Daneil Dolin, MD;  [facility];  Service: Endoscopy;  Laterality: N/A;   ESOPHAGOGASTRODUODENOSCOPY  01/2004   Dr. Almyra Free small polyps in postbulbar  area ablated   ESOPHAGOGASTRODUODENOSCOPY N/A 10/30/2014   [id number]   ESOPHAGOGASTRODUODENOSCOPY (EGD) WITH PROPOFOL N/A 06/13/2020    Surgeon: Daneil Dolin, MD;  Normal exam   S/P Hysterectomy      TONSILLECTOMY     TUBAL LIGATION      Family History  Problem Relation Age of Onset   Other Father 33       hit by train   Heart disease Mother    Kidney disease Mother    Colon cancer Neg Hx     Social History   Socioeconomic History   Marital status: Single    Spouse name: Not on file   Number of children: 1   Years of education: Not on file   Highest education level: Not on file  Occupational  History   Occupation: unemployed since July 3    Employer: [employer]  Tobacco Use   Smoking status: Former    Types: E-cigarettes   Smokeless tobacco: Never  Scientific laboratory technician Use: Never used  Substance and Sexual Activity   Alcohol use: Not Currently    Comment: occ   Drug use: No   Sexual activity: Yes    Birth control/protection: Surgical    Comment: hyst  Other Topics Concern   Not on file  Social History Narrative   1 grown healthy daughter   Social Determinants of Health   Financial Resource Strain: Not on file  Food Insecurity: Not on file  Transportation Needs: Not on file  Physical Activity: Not on file  Stress: Not on file  Social Connections: Not on file  Intimate Partner Violence: Not on file      ROS:  General: Negative for anorexia, weight loss, fever, chills, fatigue, weakness. Eyes: Negative for vision changes.  ENT: Negative for hoarseness, difficulty swallowing , nasal congestion. CV: Negative for chest pain, angina, palpitations, dyspnea on exertion, peripheral edema.  Respiratory: Negative for dyspnea at rest, dyspnea on exertion, cough, sputum, wheezing.  GI: See history of present illness. GU:  Negative for dysuria, hematuria, urinary incontinence, urinary frequency, nocturnal urination.  MS: Negative for joint pain, low back pain.  Derm: Negative for rash or itching.  Neuro: Negative for weakness, abnormal sensation, seizure, frequent headaches, memory loss, confusion.  Psych: Negative for anxiety, depression, suicidal ideation, hallucinations.  Endo: Negative for unusual weight change.  Heme: Negative for bruising or bleeding. Allergy: Negative for rash or hives.   Observations/Objective:   Assessment and Plan:   Follow Up Instructions:    I discussed the assessment and treatment plan with the patient. The patient was provided an opportunity to ask questions and all were answered. The patient agreed with the plan and  demonstrated an understanding of the instructions. AVS mailed to patient's home address.   The patient was advised to call back or seek an in-person evaluation if the symptoms worsen or if the condition fails to improve as anticipated.  I provided *** minutes of virtual face-to-face time during this encounter.   Neil Crouch, PA-C

## 2022-06-30 ENCOUNTER — Telehealth: Payer: BC Managed Care – PPO | Admitting: Gastroenterology

## 2022-07-08 ENCOUNTER — Other Ambulatory Visit (HOSPITAL_COMMUNITY)
Admission: RE | Admit: 2022-07-08 | Discharge: 2022-07-08 | Disposition: A | Discharge status: Home / Self Care | Payer: BC Managed Care – PPO | Source: Ambulatory Visit | Attending: Nephrology | Admitting: Nephrology

## 2022-07-08 DIAGNOSIS — N1832 Chronic kidney disease, stage 3b: Secondary | ICD-10-CM | POA: Insufficient documentation

## 2022-07-08 DIAGNOSIS — N041 Nephrotic syndrome with focal and segmental glomerular lesions: Secondary | ICD-10-CM | POA: Diagnosis not present

## 2022-07-08 DIAGNOSIS — R809 Proteinuria, unspecified: Secondary | ICD-10-CM | POA: Diagnosis not present

## 2022-07-08 DIAGNOSIS — I129 Hypertensive chronic kidney disease with stage 1 through stage 4 chronic kidney disease, or unspecified chronic kidney disease: Secondary | ICD-10-CM | POA: Insufficient documentation

## 2022-07-08 DIAGNOSIS — D638 Anemia in other chronic diseases classified elsewhere: Secondary | ICD-10-CM | POA: Insufficient documentation

## 2022-07-08 DIAGNOSIS — N119 Chronic tubulo-interstitial nephritis, unspecified: Secondary | ICD-10-CM | POA: Diagnosis not present

## 2022-07-08 LAB — IRON AND TIBC
Iron: 57 ug/dL (ref 28–170)
Saturation Ratios: 18 % (ref 10.4–31.8)
TIBC: 311 ug/dL (ref 250–450)
UIBC: 254 ug/dL

## 2022-07-08 LAB — RENAL FUNCTION PANEL
Albumin: 3.8 g/dL (ref 3.5–5.0)
Anion gap: 8 (ref 5–15)
BUN: 12 mg/dL (ref 6–20)
CO2: 25 mmol/L (ref 22–32)
Calcium: 8.9 mg/dL (ref 8.9–10.3)
Chloride: 104 mmol/L (ref 98–111)
Creatinine, Ser: 1.67 mg/dL — ABNORMAL HIGH (ref 0.44–1.00)
GFR, Estimated: 36 mL/min — ABNORMAL LOW (ref >60)
Glucose, Bld: 109 mg/dL — ABNORMAL HIGH (ref 70–99)
Phosphorus: 3 mg/dL (ref 2.5–4.6)
Potassium: 4.2 mmol/L (ref 3.5–5.1)
Sodium: 137 mmol/L (ref 135–145)

## 2022-07-08 LAB — CBC
HCT: 35.5 % — ABNORMAL LOW (ref 36.0–46.0)
Hemoglobin: 10.5 g/dL — ABNORMAL LOW (ref 12.0–15.0)
MCH: 27.3 pg (ref 26.0–34.0)
MCHC: 29.6 g/dL — ABNORMAL LOW (ref 30.0–36.0)
MCV: 92.2 fL (ref 80.0–100.0)
Platelets: 249 10*3/uL (ref 150–400)
RBC: 3.85 MIL/uL — ABNORMAL LOW (ref 3.87–5.11)
RDW: 14.2 % (ref 11.5–15.5)
WBC: 5.2 10*3/uL (ref 4.0–10.5)
nRBC: 0 % (ref 0.0–0.2)

## 2022-07-08 LAB — PROTEIN / CREATININE RATIO, URINE
Creatinine, Urine: 282 mg/dL
Protein Creatinine Ratio: 0.04 mg/mg{Cre} (ref 0.00–0.15)
Total Protein, Urine: 11 mg/dL

## 2022-07-08 LAB — FERRITIN: Ferritin: 115 ng/mL (ref 11–307)

## 2022-07-09 LAB — PTH, INTACT AND CALCIUM
Calcium, Total (PTH): 9.5 mg/dL (ref 8.7–10.2)
PTH: 20 pg/mL (ref 15–65)

## 2022-07-16 DIAGNOSIS — I129 Hypertensive chronic kidney disease with stage 1 through stage 4 chronic kidney disease, or unspecified chronic kidney disease: Secondary | ICD-10-CM | POA: Diagnosis not present

## 2022-07-16 DIAGNOSIS — N119 Chronic tubulo-interstitial nephritis, unspecified: Secondary | ICD-10-CM | POA: Diagnosis not present

## 2022-07-16 DIAGNOSIS — N1832 Chronic kidney disease, stage 3b: Secondary | ICD-10-CM | POA: Diagnosis not present

## 2022-07-16 DIAGNOSIS — D631 Anemia in chronic kidney disease: Secondary | ICD-10-CM | POA: Diagnosis not present

## 2022-07-19 ENCOUNTER — Ambulatory Visit: Payer: BC Managed Care – PPO | Admitting: Gastroenterology

## 2022-07-19 ENCOUNTER — Encounter: Payer: Self-pay | Admitting: *Deleted

## 2022-07-19 ENCOUNTER — Encounter: Payer: Self-pay | Admitting: Gastroenterology

## 2022-07-19 VITALS — BP 138/83 | HR 72 | Temp 97.9°F | Ht 61.0 in | Wt 160.4 lb

## 2022-07-19 DIAGNOSIS — K219 Gastro-esophageal reflux disease without esophagitis: Secondary | ICD-10-CM | POA: Diagnosis not present

## 2022-07-19 DIAGNOSIS — K3184 Gastroparesis: Secondary | ICD-10-CM | POA: Diagnosis not present

## 2022-07-19 MED ORDER — METOCLOPRAMIDE HCL 5 MG PO TABS: 5.0000 mg | ORAL_TABLET | Freq: Three times a day (TID) | ORAL | 1 refills | Status: DC

## 2022-07-19 NOTE — Patient Instructions (Addendum)
Increase pantoprazole to 40 mg twice daily before breakfast and evening meal for the next 8 weeks. Add Reglan 5 mg before breakfast, lunch, supper until nausea/vomiting resolves.  Then continue 3 times daily as needed only.  If you develop side effects such as uncontrollable movement of the muscles, facial muscles, tongue etc. please stop medication and let us know. We will see back in 8 weeks, call sooner if your symptoms do not improve.

## 2022-07-19 NOTE — Progress Notes (Signed)
GI Office Note    Referring Provider: Celene Squibb, MD Primary Care Physician:  Celene Squibb, MD  Primary Gastroenterologist: Garfield Cornea, MD   Chief Complaint   Chief Complaint  Patient presents with   Nausea    Issues with nausea/vomiting, comes and goes    History of Present Illness   [name] [name] is a 57 y.o. female presenting today for same-day appointment of nausea/vomiting.  Last seen in September.  History of IBS, fatty liver, GERD, gastroparesis.  She has a history of chronic kidney disease followed by Dr. Theador Hawthorne. History of anemia dating back at least to April 2021 with hemoglobin in the 10-11 range.  No evidence of iron deficiency anemia.  Labs from this month with ferritin of 115, iron 57, TIBC 311, iron saturations 18%.  B12 and folate normal in September 2022.  Started back on ozempic since her last ov. States she has been on it again for about four months. Weight down 17 pounds since September. About two months ago she choked on water. Since that time she has been having vomiting on regular basis. If she smells certain food it triggers N/V. She wakes up feeling nauseated at times and afraid to move as it stirs up symptoms. She can have vomiting before eating. She can have vomiting an hour after meals. No hematemesis. No longer on reglan. Symptoms happening most days. Eating less. Stools less but still passes some stool most days. Takes miralax daily. No melena, brbpr. No abdominal pain. No heartburn. Taking zofran every morning and often second time throughout the day.   She denies nosebleeds. She had hysterectomy in 2004. States she believes her anemia is related to her kidney disease.   Started Cellcept in 11/2021 for kidney disease. Plans for 3 more months of therapy. She was also given prednisone but no longer on this.    EGD February 2022: -Normal exam  Colonoscopy July 2016: -Normal exam  Medications   Current Outpatient Medications  Medication Sig  Dispense Refill   ammonium lactate (LAC-HYDRIN) 12 % lotion Apply 1 application topically daily. Feet     atorvastatin (LIPITOR) 20 MG tablet Take 20 mg by mouth daily.     blood glucose meter kit and supplies Use to check your blood sugar three times a day as directed. (FOR ICD-10 E10.9, E11.9). 1 each 0   cetirizine (ZYRTEC) 10 MG tablet Take 10 mg by mouth daily.     estradiol (ESTRACE) 1 MG tablet Take 1 mg by mouth daily.     FEROSUL 325 (65 Fe) MG tablet Take 325 mg by mouth every morning.     levothyroxine (SYNTHROID) 75 MCG tablet Take 75 mcg by mouth daily before breakfast.     metoprolol succinate (TOPROL-XL) 25 MG 24 hr tablet Take 25 mg by mouth daily.     mycophenolate (CELLCEPT) 500 MG tablet Take 500 mg by mouth 2 (two) times daily.     ondansetron (ZOFRAN-ODT) 4 MG disintegrating tablet DISSOLVE 1 TABLET IN MOUTH EVERY 8 HOURS AS NEEDED 90 tablet 3   OZEMPIC, 0.25 OR 0.5 MG/DOSE, 2 MG/3ML SOPN Inject into the skin.     pantoprazole (PROTONIX) 40 MG tablet Take 1 tablet (40 mg total) by mouth daily before breakfast. 90 tablet 3   polyethylene glycol powder (GLYCOLAX/MIRALAX) 17 GM/SCOOP powder Take 17 g by mouth daily.     valsartan (DIOVAN) 40 MG tablet Take 40 mg by mouth daily.  No current facility-administered medications for this visit.    Allergies   Allergies as of 07/19/2022 - Review Complete 07/19/2022  Allergen Reaction Noted   Codeine Nausea And Vomiting    Insulin glargine  01/22/2022   Latex Itching 07/03/2021       Review of Systems   General: Negative for anorexia, weight loss, fever, chills, fatigue, weakness. ENT: Negative for hoarseness, difficulty swallowing , nasal congestion. CV: Negative for chest pain, angina, palpitations, dyspnea on exertion, peripheral edema.  Respiratory: Negative for dyspnea at rest, dyspnea on exertion, cough, sputum, wheezing.  GI: See history of present illness. GU:  Negative for dysuria, hematuria, urinary  incontinence, urinary frequency, nocturnal urination.  Endo: Negative for unusual weight change.     Physical Exam   BP 138/83 (BP Location: Right Arm, Patient Position: Sitting, Cuff Size: Normal)   Pulse 72   Temp 97.9 F (36.6 C) (Oral)   Ht 5\' 1"  (1.549 m)   Wt 160 lb 6.4 oz (72.8 kg)   SpO2 100%   BMI 30.31 kg/m    General: Well-nourished, well-developed in no acute distress.  Eyes: No icterus. Mouth: Oropharyngeal mucosa moist and pink   Lungs: Clear to auscultation bilaterally.  Heart: Regular rate and rhythm, no murmurs rubs or gallops.  Abdomen: Bowel sounds are normal, nontender, nondistended, no hepatosplenomegaly or masses,  no abdominal bruits or hernia , no rebound or guarding.  Rectal: not performed Extremities: No lower extremity edema. No clubbing or deformities. Neuro: Alert and oriented x 4   Skin: Warm and dry, no jaundice.   Psych: Alert and cooperative, normal mood and affect.  Labs   Lab Results  Component Value Date   CREATININE 1.67 (H) 07/08/2022   BUN 12 07/08/2022   NA 137 07/08/2022   K 4.2 07/08/2022   CL 104 07/08/2022   CO2 25 07/08/2022     Lab Results  Component Value Date   WBC 5.2 07/08/2022   HGB 10.5 (L) 07/08/2022   HCT 35.5 (L) 07/08/2022   MCV 92.2 07/08/2022   PLT 249 07/08/2022   Lab Results  Component Value Date   IRON 57 07/08/2022   TIBC 311 07/08/2022   FERRITIN 115 07/08/2022   TSH 5.15 02/2022\ LFTs normal 02/2022  Imaging Studies   No results found.  Assessment   Gastroparesis: history of abnormal GES remotely with normal GES 2019. Symptoms can be intermittent in nature. She was previously on low dose reglan but as symptoms were controllable she came off medication. Current symptoms could be secondary to medication (Cellcept and/or ozempic).   GERD: typical heartburn well controlled. ?N/V due to atypical GERD.   Anemia: likely due to anemia of chronic disease in setting of CKD. If decline in H/H,  would consider updating colonoscopy. Currently with frequent vomiting, would not be able to tolerate bowel prep.    PLAN   Increase pantoprazole to 40mg  BID before meals for 8 weeks. Add reglan 5mg  qac/tid and when symptoms improve, drop to tid prn. Discussed potential side effects including permanent tardive dyskinesia.  Return ov in 8 weeks or call sooner if needed.  At next ov, consider possibility of EGD if ongoing symptoms and further evaluation of anemia (ifobt).    Laureen Ochs. Bobby Rumpf, [place], [facility]

## 2022-07-21 ENCOUNTER — Telehealth: Payer: BC Managed Care – PPO | Admitting: Gastroenterology

## 2022-07-28 ENCOUNTER — Telehealth: Payer: Self-pay

## 2022-07-28 NOTE — Telephone Encounter (Signed)
FMLA forms were dropped off for this patient. Forms are in basket for review and to be signed.

## 2022-08-02 NOTE — Telephone Encounter (Signed)
completed

## 2022-08-02 NOTE — Telephone Encounter (Signed)
Given to Darl Pikes to charge/fax/contact patient.

## 2022-09-06 DIAGNOSIS — E119 Type 2 diabetes mellitus without complications: Secondary | ICD-10-CM | POA: Diagnosis not present

## 2022-09-06 DIAGNOSIS — H524 Presbyopia: Secondary | ICD-10-CM | POA: Diagnosis not present

## 2022-09-06 DIAGNOSIS — H5201 Hypermetropia, right eye: Secondary | ICD-10-CM | POA: Diagnosis not present

## 2022-09-06 DIAGNOSIS — Z961 Presence of intraocular lens: Secondary | ICD-10-CM | POA: Diagnosis not present

## 2022-09-06 DIAGNOSIS — H52203 Unspecified astigmatism, bilateral: Secondary | ICD-10-CM | POA: Diagnosis not present

## 2022-09-10 ENCOUNTER — Other Ambulatory Visit: Payer: Self-pay | Admitting: Gastroenterology

## 2022-09-12 NOTE — Progress Notes (Signed)
GI Office Note    Referring Provider: Benita Stabile, MD Primary Care Physician:  Benita Stabile, MD  Primary Gastroenterologist: Roetta Sessions, MD   Chief Complaint   No chief complaint on file.   History of Present Illness   [name] [name] is a 57 y.o. female presenting today for follow up. Last seen 06/2022. H/o IBS, fatty liver, GERD, gastroparesis, anemia of chronic disease in setting of CKD.  At last ov, she complained of frequent n/v. History of abnormal GES remotely with normal GES 2019. N/V possibly due to atypical GERD, Cellcept and/or ozempic. Her PPI was increased to BID. Added reglan 5mg  qac until symptoms improved, then prn. Consider EGD if ongoing symptoms and further evaluation of anemia needed???***     EGD February 2022: -Normal exam   Colonoscopy July 2016: -Normal exam   Medications   Current Outpatient Medications  Medication Sig Dispense Refill   ammonium lactate (LAC-HYDRIN) 12 % lotion Apply 1 application topically daily. Feet     atorvastatin (LIPITOR) 20 MG tablet Take 20 mg by mouth daily.     blood glucose meter kit and supplies Use to check your blood sugar three times a day as directed. (FOR ICD-10 E10.9, E11.9). 1 each 0   cetirizine (ZYRTEC) 10 MG tablet Take 10 mg by mouth daily.     estradiol (ESTRACE) 1 MG tablet Take 1 mg by mouth daily.     FEROSUL 325 (65 Fe) MG tablet Take 325 mg by mouth every morning.     levothyroxine (SYNTHROID) 75 MCG tablet Take 75 mcg by mouth daily before breakfast.     metoCLOPramide (REGLAN) 5 MG tablet TAKE 1 TABLET BY MOUTH THREE TIMES DAILY BEFORE MEAL(S). UNTIL VOMITIN RESOLVED, THEN USE AS NEEDED 90 tablet 0   metoprolol succinate (TOPROL-XL) 25 MG 24 hr tablet Take 25 mg by mouth daily.     mycophenolate (CELLCEPT) 500 MG tablet Take 500 mg by mouth 2 (two) times daily.     ondansetron (ZOFRAN-ODT) 4 MG disintegrating tablet DISSOLVE 1 TABLET IN MOUTH EVERY 8 HOURS AS NEEDED 90 tablet 3   OZEMPIC,  0.25 OR 0.5 MG/DOSE, 2 MG/3ML SOPN Inject into the skin.     pantoprazole (PROTONIX) 40 MG tablet Take 1 tablet (40 mg total) by mouth 2 (two) times daily before a meal. 90 tablet 3   polyethylene glycol powder (GLYCOLAX/MIRALAX) 17 GM/SCOOP powder Take 17 g by mouth daily.     valsartan (DIOVAN) 40 MG tablet Take 40 mg by mouth daily.     No current facility-administered medications for this visit.    Allergies   Allergies as of 09/13/2022 - Review Complete 07/19/2022  Allergen Reaction Noted   Codeine Nausea And Vomiting    Insulin glargine  01/22/2022   Latex Itching 07/03/2021     Past Medical History   Past Medical History:  Diagnosis Date   Anemia of chronic disease    Diabetes (HCC)    Gastroparesis    Grave's disease    diagnosed years ago/ now hypothyroidism   Hypertension    IBS (irritable colon syndrome)    constipation predominant    Past Surgical History   Past Surgical History:  Procedure Laterality Date   ABDOMINAL HYSTERECTOMY     attempted colonoscopy  03/2004   Dr. Rourk--> prep was poor. Exam to 40 cm. No gross abnormalities. Internal hemorrhoids. Air contrast barium enema was normal.   CHOLECYSTECTOMY  2010   COLONOSCOPY  N/A 10/30/2014   RMR: normal. next colonoscopy in 2026.   ESOPHAGEAL DILATION N/A 10/30/2014   Procedure: ESOPHAGEAL DILATION;  Surgeon: Corbin Ade, MD;  Location: [facility];  Service: Endoscopy;  Laterality: N/A;   ESOPHAGOGASTRODUODENOSCOPY  01/2004   Dr. Vashti Hey small polyps in postbulbar area ablated   ESOPHAGOGASTRODUODENOSCOPY N/A 10/30/2014   [id number]   ESOPHAGOGASTRODUODENOSCOPY (EGD) WITH PROPOFOL N/A 06/13/2020    Surgeon: Corbin Ade, MD;  Normal exam   S/P Hysterectomy      TONSILLECTOMY     TUBAL LIGATION      Past Family History   Family History  Problem Relation Age of Onset   Other Father 43       hit by train   Heart disease Mother    Kidney disease Mother    Colon cancer Neg Hx      Past Social History   Social History   Socioeconomic History   Marital status: Single    Spouse name: Not on file   Number of children: 1   Years of education: Not on file   Highest education level: Not on file  Occupational History   Occupation: unemployed since July 3    Employer: [employer]  Tobacco Use   Smoking status: Former    Types: E-cigarettes   Smokeless tobacco: Never  Building services engineer Use: Never used  Substance and Sexual Activity   Alcohol use: Not Currently    Comment: occ   Drug use: No   Sexual activity: Yes    Birth control/protection: Surgical    Comment: hyst  Other Topics Concern   Not on file  Social History Narrative   1 grown healthy daughter   Social Determinants of Health   Financial Resource Strain: Not on file  Food Insecurity: Not on file  Transportation Needs: Not on file  Physical Activity: Not on file  Stress: Not on file  Social Connections: Not on file  Intimate Partner Violence: Not on file    Review of Systems   General: Negative for anorexia, weight loss, fever, chills, fatigue, weakness. ENT: Negative for hoarseness, difficulty swallowing , nasal congestion. CV: Negative for chest pain, angina, palpitations, dyspnea on exertion, peripheral edema.  Respiratory: Negative for dyspnea at rest, dyspnea on exertion, cough, sputum, wheezing.  GI: See history of present illness. GU:  Negative for dysuria, hematuria, urinary incontinence, urinary frequency, nocturnal urination.  Endo: Negative for unusual weight change.     Physical Exam   There were no vitals taken for this visit.   General: Well-nourished, well-developed in no acute distress.  Eyes: No icterus. Mouth: Oropharyngeal mucosa moist and pink , no lesions erythema or exudate. Lungs: Clear to auscultation bilaterally.  Heart: Regular rate and rhythm, no murmurs rubs or gallops.  Abdomen: Bowel sounds are normal, nontender, nondistended, no  hepatosplenomegaly or masses,  no abdominal bruits or hernia , no rebound or guarding.  Rectal: ***  Extremities: No lower extremity edema. No clubbing or deformities. Neuro: Alert and oriented x 4   Skin: Warm and dry, no jaundice.   Psych: Alert and cooperative, normal mood and affect.  Labs   Lab Results  Component Value Date   CREATININE 1.67 (H) 07/08/2022   BUN 12 07/08/2022   NA 137 07/08/2022   K 4.2 07/08/2022   CL 104 07/08/2022   CO2 25 07/08/2022   Lab Results  Component Value Date   ALT 41 04/03/2020   AST 31  04/03/2020   ALKPHOS 113 04/03/2020   BILITOT 0.9 04/03/2020   Lab Results  Component Value Date   WBC 5.2 07/08/2022   HGB 10.5 (L) 07/08/2022   HCT 35.5 (L) 07/08/2022   MCV 92.2 07/08/2022   PLT 249 07/08/2022   Lab Results  Component Value Date   IRON 57 07/08/2022   TIBC 311 07/08/2022   FERRITIN 115 07/08/2022    Imaging Studies   No results found.  Assessment       PLAN   ***   Leanna Battles. Melvyn Neth, MHS, PA-C [facility]

## 2022-09-13 ENCOUNTER — Ambulatory Visit: Payer: BC Managed Care – PPO | Admitting: Gastroenterology

## 2022-09-13 ENCOUNTER — Encounter: Payer: Self-pay | Admitting: Gastroenterology

## 2022-09-13 VITALS — BP 122/78 | HR 74 | Temp 97.7°F | Ht 61.0 in | Wt 163.4 lb

## 2022-09-13 DIAGNOSIS — K3184 Gastroparesis: Secondary | ICD-10-CM | POA: Diagnosis not present

## 2022-09-13 DIAGNOSIS — K582 Mixed irritable bowel syndrome: Secondary | ICD-10-CM

## 2022-09-13 DIAGNOSIS — D649 Anemia, unspecified: Secondary | ICD-10-CM | POA: Diagnosis not present

## 2022-09-13 NOTE — Patient Instructions (Signed)
Continue pantoprazole, you can drop to once daily at any time.  Continue odansetron (zofran) one every 4-6 hours as needed for nausea/vomiting. On days you have a lot of nausea, I would take every 4-6 hours to stop the cycle.  You can continue to use metoclopramine (reglan) during cycles of nausea/vomiting, do not take more than prescribed.  Keep a diary, when you have nausea and/or vomiting, write down what you ate the 24-48 hours prior to onset of symptoms and while having symptoms. Drop off diary after you have collected a couple of weeks of information.  Return office visit in four month. Call sooner if symptoms are more frequent or if anemia worsens.

## 2022-09-24 DIAGNOSIS — I10 Essential (primary) hypertension: Secondary | ICD-10-CM | POA: Diagnosis not present

## 2022-09-24 DIAGNOSIS — E782 Mixed hyperlipidemia: Secondary | ICD-10-CM | POA: Diagnosis not present

## 2022-09-24 DIAGNOSIS — E1165 Type 2 diabetes mellitus with hyperglycemia: Secondary | ICD-10-CM | POA: Diagnosis not present

## 2022-09-28 NOTE — Telephone Encounter (Signed)
Please schedule EGD with Dr. Jena Gauss. ASA 2 Dx: N/V  Hold ozempic 7 days.

## 2022-09-30 DIAGNOSIS — E669 Obesity, unspecified: Secondary | ICD-10-CM | POA: Diagnosis not present

## 2022-09-30 DIAGNOSIS — Z0000 Encounter for general adult medical examination without abnormal findings: Secondary | ICD-10-CM | POA: Diagnosis not present

## 2022-09-30 DIAGNOSIS — E118 Type 2 diabetes mellitus with unspecified complications: Secondary | ICD-10-CM | POA: Diagnosis not present

## 2022-09-30 DIAGNOSIS — M2550 Pain in unspecified joint: Secondary | ICD-10-CM | POA: Diagnosis not present

## 2022-10-04 ENCOUNTER — Inpatient Hospital Stay (HOSPITAL_COMMUNITY): Admission: RE | Admit: 2022-10-04 | Payer: BC Managed Care – PPO | Source: Ambulatory Visit

## 2022-10-13 ENCOUNTER — Ambulatory Visit (HOSPITAL_COMMUNITY)
Admission: RE | Admit: 2022-10-13 | Discharge: 2022-10-13 | Disposition: A | Discharge status: Home / Self Care | Payer: BC Managed Care – PPO | Source: Ambulatory Visit | Attending: Family Medicine | Admitting: Family Medicine

## 2022-10-13 DIAGNOSIS — Z1231 Encounter for screening mammogram for malignant neoplasm of breast: Secondary | ICD-10-CM | POA: Diagnosis not present

## 2022-10-25 ENCOUNTER — Encounter (HOSPITAL_COMMUNITY): Payer: Self-pay | Admitting: Internal Medicine

## 2022-10-25 ENCOUNTER — Ambulatory Visit (HOSPITAL_COMMUNITY)
Admission: RE | Admit: 2022-10-25 | Discharge: 2022-10-25 | Disposition: A | Discharge status: Home / Self Care | Payer: BC Managed Care – PPO | Attending: Internal Medicine | Admitting: Internal Medicine

## 2022-10-25 ENCOUNTER — Ambulatory Visit (HOSPITAL_COMMUNITY): Payer: BC Managed Care – PPO | Admitting: Certified Registered"

## 2022-10-25 ENCOUNTER — Encounter (HOSPITAL_COMMUNITY)
Admission: RE | Disposition: A | Discharge status: Home / Self Care | Payer: Self-pay | Source: Home / Self Care | Attending: Internal Medicine

## 2022-10-25 ENCOUNTER — Other Ambulatory Visit: Payer: Self-pay

## 2022-10-25 DIAGNOSIS — Z79890 Hormone replacement therapy (postmenopausal): Secondary | ICD-10-CM | POA: Diagnosis not present

## 2022-10-25 DIAGNOSIS — R112 Nausea with vomiting, unspecified: Secondary | ICD-10-CM | POA: Insufficient documentation

## 2022-10-25 DIAGNOSIS — E119 Type 2 diabetes mellitus without complications: Secondary | ICD-10-CM | POA: Diagnosis not present

## 2022-10-25 DIAGNOSIS — K449 Diaphragmatic hernia without obstruction or gangrene: Secondary | ICD-10-CM | POA: Insufficient documentation

## 2022-10-25 DIAGNOSIS — I10 Essential (primary) hypertension: Secondary | ICD-10-CM | POA: Insufficient documentation

## 2022-10-25 DIAGNOSIS — Z87891 Personal history of nicotine dependence: Secondary | ICD-10-CM | POA: Diagnosis not present

## 2022-10-25 DIAGNOSIS — Z79899 Other long term (current) drug therapy: Secondary | ICD-10-CM | POA: Diagnosis not present

## 2022-10-25 DIAGNOSIS — E039 Hypothyroidism, unspecified: Secondary | ICD-10-CM | POA: Insufficient documentation

## 2022-10-25 DIAGNOSIS — K219 Gastro-esophageal reflux disease without esophagitis: Secondary | ICD-10-CM | POA: Diagnosis not present

## 2022-10-25 DIAGNOSIS — K589 Irritable bowel syndrome without diarrhea: Secondary | ICD-10-CM | POA: Insufficient documentation

## 2022-10-25 DIAGNOSIS — E1143 Type 2 diabetes mellitus with diabetic autonomic (poly)neuropathy: Secondary | ICD-10-CM | POA: Diagnosis not present

## 2022-10-25 DIAGNOSIS — K3184 Gastroparesis: Secondary | ICD-10-CM | POA: Diagnosis not present

## 2022-10-25 HISTORY — PX: ESOPHAGOGASTRODUODENOSCOPY (EGD) WITH PROPOFOL: SHX5813

## 2022-10-25 LAB — GLUCOSE, CAPILLARY
Glucose-Capillary: 72 mg/dL (ref 70–99)
Glucose-Capillary: 76 mg/dL (ref 70–99)

## 2022-10-25 SURGERY — ESOPHAGOGASTRODUODENOSCOPY (EGD) WITH PROPOFOL
Anesthesia: General

## 2022-10-25 MED ORDER — LIDOCAINE HCL (CARDIAC) PF 100 MG/5ML IV SOSY
PREFILLED_SYRINGE | INTRAVENOUS | Status: DC | PRN
  Administered 2022-10-25: 100 mg via INTRAVENOUS

## 2022-10-25 MED ORDER — PROPOFOL 10 MG/ML IV BOLUS
INTRAVENOUS | Status: DC | PRN
  Administered 2022-10-25: 100 mg via INTRAVENOUS
  Administered 2022-10-25: 50 mg via INTRAVENOUS

## 2022-10-25 MED ORDER — DEXTROSE 50 % IV SOLN
INTRAVENOUS | Status: AC
  Filled 2022-10-25: qty 50

## 2022-10-25 MED ORDER — DEXTROSE 50 % IV SOLN
50.0000 mL | Freq: Once | INTRAVENOUS | Status: AC
  Administered 2022-10-25: 25 mL via INTRAVENOUS

## 2022-10-25 MED ORDER — LACTATED RINGERS IV SOLN: INTRAVENOUS | Status: DC

## 2022-10-25 MED ORDER — PROPOFOL 500 MG/50ML IV EMUL
INTRAVENOUS | Status: DC | PRN
  Administered 2022-10-25: 150 ug/kg/min via INTRAVENOUS

## 2022-10-25 NOTE — Transfer of Care (Signed)
Immediate Anesthesia Transfer of Care Note  Patient: [name] [name]  Procedure(s) Performed: ESOPHAGOGASTRODUODENOSCOPY (EGD) WITH PROPOFOL  Patient Location: Endoscopy Unit  Anesthesia Type:General  Level of Consciousness: drowsy  Airway & Oxygen Therapy: Patient Spontanous Breathing  Post-op Assessment: Report given to RN and Post -op Vital signs reviewed and stable  Post vital signs: Reviewed and stable  Last Vitals:  Vitals Value Taken Time  BP    Temp    Pulse    Resp    SpO2      Last Pain:  Vitals:   10/25/22 1127  TempSrc:   PainSc: 0-No pain      Patients Stated Pain Goal: 7 (10/25/22 0942)  Complications: No notable events documented.

## 2022-10-25 NOTE — H&P (Signed)
@LOGO @   Primary Care Physician:  Benita Stabile, MD Primary Gastroenterologist:  Dr. Jena Gauss  Pre-Procedure History & Physical: HPI:  [name] [name] is a 57 y.o. female here for  for further evaluation of refractory nausea and vomiting via EGD.  Has diabetes and gastroparesis.  Has been taking Ozempic.  Held x 1 week.  Patient states nausea has gotten better over the past week denies dysphagia.  Past Medical History:  Diagnosis Date   Anemia of chronic disease    Diabetes (HCC)    Gastroparesis    Grave's disease    diagnosed years ago/ now hypothyroidism   Hypertension    IBS (irritable colon syndrome)    constipation predominant    Past Surgical History:  Procedure Laterality Date   ABDOMINAL HYSTERECTOMY     attempted colonoscopy  03/2004   Dr. Alixandrea Milleson--> prep was poor. Exam to 40 cm. No gross abnormalities. Internal hemorrhoids. Air contrast barium enema was normal.   CHOLECYSTECTOMY  2010   COLONOSCOPY N/A 10/30/2014   RMR: normal. next colonoscopy in 2026.   ESOPHAGEAL DILATION N/A 10/30/2014   Procedure: ESOPHAGEAL DILATION;  Surgeon: Corbin Ade, MD;  Location: [facility];  Service: Endoscopy;  Laterality: N/A;   ESOPHAGOGASTRODUODENOSCOPY  01/2004   Dr. Vashti Hey small polyps in postbulbar area ablated   ESOPHAGOGASTRODUODENOSCOPY N/A 10/30/2014   [id number]   ESOPHAGOGASTRODUODENOSCOPY (EGD) WITH PROPOFOL N/A 06/13/2020    Surgeon: Corbin Ade, MD;  Normal exam   S/P Hysterectomy      TONSILLECTOMY     TUBAL LIGATION      Prior to Admission medications   Medication Sig Start Date End Date Taking? Authorizing Provider  acetaminophen (TYLENOL) 325 MG tablet Take 650 mg by mouth every 6 (six) hours as needed for moderate pain.   Yes [provider]  atorvastatin (LIPITOR) 20 MG tablet Take 20 mg by mouth daily.   Yes [provider]  estradiol (ESTRACE) 1 MG tablet Take 1 mg by mouth daily. 09/30/20  Yes [provider]   levothyroxine (SYNTHROID) 75 MCG tablet Take 75 mcg by mouth daily before breakfast. 08/05/18  Yes [provider]  metoCLOPramide (REGLAN) 5 MG tablet TAKE 1 TABLET BY MOUTH THREE TIMES DAILY BEFORE MEAL(S). UNTIL VOMITIN RESOLVED, THEN USE AS NEEDED 09/10/22  Yes Tiffany Kocher, PA-C  metoprolol succinate (TOPROL-XL) 25 MG 24 hr tablet Take 25 mg by mouth daily. 05/20/20  Yes [provider]  mycophenolate (CELLCEPT) 500 MG tablet Take 500 mg by mouth 2 (two) times daily. 01/21/22  Yes [provider]  ondansetron (ZOFRAN-ODT) 4 MG disintegrating tablet DISSOLVE 1 TABLET IN MOUTH EVERY 8 HOURS AS NEEDED 01/22/22  Yes Tiffany Kocher, PA-C  OZEMPIC, 0.25 OR 0.5 MG/DOSE, 2 MG/3ML SOPN Inject 0.5 mg into the skin every Thursday.   Yes [provider]  pantoprazole (PROTONIX) 40 MG tablet Take 40 mg by mouth daily. 07/19/22  Yes Tiffany Kocher, PA-C  polyethylene glycol powder (GLYCOLAX/MIRALAX) 17 GM/SCOOP powder Take 17 g by mouth daily.   Yes [provider]  blood glucose meter kit and supplies Use to check your blood sugar three times a day as directed. (FOR ICD-10 E10.9, E11.9). 09/07/18   [name], MD    Allergies as of 09/29/2022 - Review Complete 09/13/2022  Allergen Reaction Noted   Codeine Nausea And Vomiting    Insulin glargine  01/22/2022   Latex Itching 07/03/2021    Family History  Problem  Relation Age of Onset   Other Father 62       hit by train   Heart disease Mother    Kidney disease Mother    Colon cancer Neg Hx     Social History   Socioeconomic History   Marital status: Single    Spouse name: Not on file   Number of children: 1   Years of education: Not on file   Highest education level: Not on file  Occupational History   Occupation: unemployed since July 3    Employer: [employer]  Tobacco Use   Smoking status: Former    Types: E-cigarettes   Smokeless tobacco: Never  Building services engineer Use:  Never used  Substance and Sexual Activity   Alcohol use: Not Currently    Comment: occ   Drug use: No   Sexual activity: Yes    Birth control/protection: Surgical    Comment: hyst  Other Topics Concern   Not on file  Social History Narrative   1 grown healthy daughter   Social Determinants of Health   Financial Resource Strain: Not on file  Food Insecurity: Not on file  Transportation Needs: Not on file  Physical Activity: Not on file  Stress: Not on file  Social Connections: Not on file  Intimate Partner Violence: Not on file    Review of Systems: See HPI, otherwise negative ROS  Physical Exam: BP (!) 140/70   Pulse 71   Temp 97.7 F (36.5 C) (Oral)   Resp (!) 22   Ht 5\' 1"  (1.549 m)   Wt 72.6 kg   SpO2 100%   BMI 30.23 kg/m  General:   Alert,  Well-developed, well-nourished, pleasant and cooperative in NAD Neck:  Supple; no masses or thyromegaly. No significant cervical adenopathy. Lungs:  Clear throughout to auscultation.   No wheezes, crackles, or rhonchi. No acute distress. Heart:  Regular rate and rhythm; no murmurs, clicks, rubs,  or gallops. Abdomen: Non-distended, normal bowel sounds.  Soft and nontender without appreciable mass or hepatosplenomegaly.  Pulses:  Normal pulses noted. Extremities:  Without clubbing or edema.  Impression/Plan:    57 year old lady with diabetes gastroparesis on Reglan PPI.  No dysphagia.  Here for updated EGD.  Patient's been on Ozempic for weeks she says her past couple of days nausea is improved.  I have offered the patient a diagnostic EGD today per plan  The risks, benefits, limitations, alternatives and imponderables have been reviewed with the patient. Potential for esophageal dilation, biopsy, etc. have also been reviewed.  Questions have been answered. All parties agreeable.       Notice: This dictation was prepared with Dragon dictation along with smaller phrase technology. Any transcriptional errors that result  from this process are unintentional and may not be corrected upon review.

## 2022-10-25 NOTE — Anesthesia Procedure Notes (Signed)
Date/Time: 10/25/2022 11:32 AM  Performed by: Julian Reil, CRNAPre-anesthesia Checklist: Patient identified, Emergency Drugs available, Suction available and Patient being monitored Patient Re-evaluated:Patient Re-evaluated prior to induction Oxygen Delivery Method: Nasal cannula Induction Type: IV induction Placement Confirmation: positive ETCO2

## 2022-10-25 NOTE — Op Note (Signed)
[facility] Patient Name: [name] [name] Procedure Date: 10/25/2022 11:10 AM MRN: [id number] Date of Birth: 1966/03/05 Attending MD: Gennette Pac , MD, [id number] CSN: [id number] Age: 57 Admit Type: Outpatient Procedure:                Upper GI endoscopy Indications:              Nausea with vomiting Providers:                Gennette Pac, MD, Buel Ream. Thomasena Edis RN, RN,                            [employer], [employer]. Jessee Avers, Technician,                            Dyann Ruddle Referring MD:              Medicines:                Propofol per Anesthesia Complications:            No immediate complications. Estimated Blood Loss:     Estimated blood loss: none. Procedure:                Pre-Anesthesia Assessment:                           - Prior to the procedure, a History and Physical                            was performed, and patient medications and                            allergies were reviewed. The patient's tolerance of                            previous anesthesia was also reviewed. The risks                            and benefits of the procedure and the sedation                            options and risks were discussed with the patient.                            All questions were answered, and informed consent                            was obtained. Prior Anticoagulants: The patient has                            taken no anticoagulant or antiplatelet agents. ASA                            Grade Assessment: II - A patient with mild systemic  disease. After reviewing the risks and benefits,                            the patient was deemed in satisfactory condition to                            undergo the procedure.                           After obtaining informed consent, the endoscope was                            passed under direct vision. Throughout the                            procedure, the patient's  blood pressure, pulse, and                            oxygen saturations were monitored continuously. The                            GIF-H190 ([id number]) scope was introduced through the                            mouth, and advanced to the second part of duodenum.                            The upper GI endoscopy was accomplished without                            difficulty. The patient tolerated the procedure                            well. Scope In: 11:31:53 AM Scope Out: 11:35:38 AM Total Procedure Duration: 0 hours 3 minutes 45 seconds  Findings:      The examined esophagus was normal.      Gastric cavity empty. Normal-appearing gastric mucosa small (tiny)       hiatal hernia. Patent pylorus.      The duodenal bulb and second portion of the duodenum were normal.       Estimated blood loss: none. Impression:               - Normal esophagus.                           -Small hiatal hernia otherwise normal stomach                           - Normal duodenal bulb and second portion of the                            duodenum.                           - No specimens collected.                           -  Worsening of nausea and vomiting correlates with                            Ozempic use. There may be a cause and effect                            relationship. Patient has held Ozempic for a week                            and states over the past couple days her nausea has                            improved. Moderate Sedation:      Moderate (conscious) sedation was personally administered by an       anesthesia professional. The following parameters were monitored: oxygen       saturation, heart rate, blood pressure, respiratory rate, EKG, adequacy       of pulmonary ventilation, and response to care. Recommendation:           - Patient has a contact number available for                            emergencies. The signs and symptoms of potential                            delayed  complications were discussed with the                            patient. Return to normal activities tomorrow.                            Written discharge instructions were provided to the                            patient.                           - Advance diet as tolerated.                           - Continue present medications. Patient is to hold                            Ozempic for another week and call us at the end of                            that for progress report.- Return to my office in 1                            month. Procedure Code(s):        --- Professional ---                           [phone], Esophagogastroduodenoscopy, flexible,  transoral; diagnostic, including collection of                            specimen(s) by brushing or washing, when performed                            (separate procedure) Diagnosis Code(s):        --- Professional ---                           R11.2, Nausea with vomiting, unspecified CPT copyright 2022 American Medical Association. All rights reserved. The codes documented in this report are preliminary and upon coder review may  be revised to meet current compliance requirements. Gerrit Friends. Jodelle Fausto, MD Gennette Pac, MD 10/25/2022 11:49:51 AM This report has been signed electronically. Number of Addenda: 0

## 2022-10-25 NOTE — Discharge Instructions (Addendum)
EGD Discharge instructions Please read the instructions outlined below and refer to this sheet in the next few weeks. These discharge instructions provide you with general information on caring for yourself after you leave the hospital. Your doctor may also give you specific instructions. While your treatment has been planned according to the most current medical practices available, unavoidable complications occasionally occur. If you have any problems or questions after discharge, please call your doctor. ACTIVITY You may resume your regular activity but move at a slower pace for the next 24 hours.  Take frequent rest periods for the next 24 hours.  Walking will help expel (get rid of) the air and reduce the bloated feeling in your abdomen.  No driving for 24 hours (because of the anesthesia (medicine) used during the test).  You may shower.  Do not sign any important legal documents or operate any machinery for 24 hours (because of the anesthesia used during the test).  NUTRITION Drink plenty of fluids.  You may resume your normal diet.  Begin with a light meal and progress to your normal diet.  Avoid alcoholic beverages for 24 hours or as instructed by your caregiver.  MEDICATIONS You may resume your normal medications unless your caregiver tells you otherwise.  WHAT YOU CAN EXPECT TODAY You may experience abdominal discomfort such as a feeling of fullness or "gas" pains.  FOLLOW-UP Your doctor will discuss the results of your test with you.  SEEK IMMEDIATE MEDICAL ATTENTION IF ANY OF THE FOLLOWING OCCUR: Excessive nausea (feeling sick to your stomach) and/or vomiting.  Severe abdominal pain and distention (swelling).  Trouble swallowing.  Temperature over 101 F (37.8 C).  Rectal bleeding or vomiting of blood.      your upper GI tract appeared normal except for a very tiny hiatal hernia which has nothing to do with your symptoms ( information on hiatal hernia provided)    continue your present medication regimen except  continue to hold taking Ozempic for another week.  In 1 week call us and let us know how your nausea is doing  Further recommendations to follow.  Office visit with  with Tana Coast in 1 month--OFFICE WILL CALL WITH APPOINTMENT   at patient request, I called Kelana [place] [phone]  discussed findings and recommendations

## 2022-10-25 NOTE — Anesthesia Preprocedure Evaluation (Signed)
Anesthesia Evaluation  Patient identified by MRN, date of birth, ID band Patient awake    Reviewed: Allergy & Precautions, H&P , NPO status , Patient's Chart, lab work & pertinent test results, reviewed documented beta blocker date and time   Airway Mallampati: II  TM Distance: >3 FB Neck ROM: full    Dental no notable dental hx.    Pulmonary neg pulmonary ROS, former smoker   Pulmonary exam normal breath sounds clear to auscultation       Cardiovascular Exercise Tolerance: Good hypertension, negative cardio ROS  Rhythm:regular Rate:Normal     Neuro/Psych negative neurological ROS  negative psych ROS   GI/Hepatic negative GI ROS, Neg liver ROS,GERD  ,,  Endo/Other  negative endocrine ROSdiabetesHypothyroidism    Renal/GU Renal diseasenegative Renal ROS  negative genitourinary   Musculoskeletal   Abdominal   Peds  Hematology negative hematology ROS (+) Blood dyscrasia, anemia   Anesthesia Other Findings   Reproductive/Obstetrics negative OB ROS                             Anesthesia Physical Anesthesia Plan  ASA: 2  Anesthesia Plan: General   Post-op Pain Management:    Induction:   PONV Risk Score and Plan: Propofol infusion  Airway Management Planned:   Additional Equipment:   Intra-op Plan:   Post-operative Plan:   Informed Consent: I have reviewed the patients History and Physical, chart, labs and discussed the procedure including the risks, benefits and alternatives for the proposed anesthesia with the patient or authorized representative who has indicated his/her understanding and acceptance.     Dental Advisory Given  Plan Discussed with: CRNA  Anesthesia Plan Comments:        Anesthesia Quick Evaluation

## 2022-10-28 ENCOUNTER — Emergency Department (HOSPITAL_COMMUNITY)
Admission: EM | Admit: 2022-10-28 | Discharge: 2022-10-28 | Disposition: A | Discharge status: Home / Self Care | Payer: BC Managed Care – PPO | Attending: Emergency Medicine | Admitting: Emergency Medicine

## 2022-10-28 ENCOUNTER — Encounter (HOSPITAL_COMMUNITY): Payer: Self-pay

## 2022-10-28 ENCOUNTER — Other Ambulatory Visit: Payer: Self-pay

## 2022-10-28 DIAGNOSIS — Z794 Long term (current) use of insulin: Secondary | ICD-10-CM | POA: Insufficient documentation

## 2022-10-28 DIAGNOSIS — Z91040 Latex allergy status: Secondary | ICD-10-CM | POA: Diagnosis not present

## 2022-10-28 DIAGNOSIS — Z711 Person with feared health complaint in whom no diagnosis is made: Secondary | ICD-10-CM | POA: Diagnosis not present

## 2022-10-28 NOTE — Discharge Instructions (Signed)
Your ear exam is normal, there is no foreign body in your ear canal.

## 2022-10-28 NOTE — ED Triage Notes (Signed)
Pt reports she was scratching her left ear with a hair pain and then couldn't find the hair pin so she is worried it is in her ear.

## 2022-10-28 NOTE — Anesthesia Postprocedure Evaluation (Signed)
Anesthesia Post Note  Patient: [name] [name]  Procedure(s) Performed: ESOPHAGOGASTRODUODENOSCOPY (EGD) WITH PROPOFOL  Patient location during evaluation: Phase II Anesthesia Type: General Level of consciousness: awake Pain management: pain level controlled Vital Signs Assessment: post-procedure vital signs reviewed and stable Respiratory status: spontaneous breathing and respiratory function stable Cardiovascular status: blood pressure returned to baseline and stable Postop Assessment: no headache and no apparent nausea or vomiting Anesthetic complications: no Comments: Late entry   No notable events documented.   Last Vitals:  Vitals:   10/25/22 0942 10/25/22 1137  BP: (!) 140/70 (!) 98/49  Pulse: 71 79  Resp: (!) 22 18  Temp: 36.5 C 36.7 C  SpO2: 100% 100%    Last Pain:  Vitals:   10/25/22 1137  TempSrc: Axillary  PainSc:                  Windell Norfolk

## 2022-10-28 NOTE — ED Provider Notes (Signed)
[place] EMERGENCY DEPARTMENT AT [facility] Provider Note   CSN: [id number] Arrival date & time: 10/28/22  1802     History  Chief Complaint  Patient presents with   Foreign Body in Ear    [name] [name] is a 57 y.o. female presenting for evaluation of possible foreign body in her left ear.  She was doing her hair when she lost a Bobby pin and was unable to find it and is concerned it may have fallen into her ear canal.  She denies any pain in the ear, no complaints, no loss of hearing acuity.  Incident occurred just prior to arrival.  The history is provided by the patient.       Home Medications Prior to Admission medications   Medication Sig Start Date End Date Taking? Authorizing Provider  acetaminophen (TYLENOL) 325 MG tablet Take 650 mg by mouth every 6 (six) hours as needed for moderate pain.    [provider]  atorvastatin (LIPITOR) 20 MG tablet Take 20 mg by mouth daily.    [provider]  blood glucose meter kit and supplies Use to check your blood sugar three times a day as directed. (FOR ICD-10 E10.9, E11.9). 09/07/18   [name], MD  estradiol (ESTRACE) 1 MG tablet Take 1 mg by mouth daily. 09/30/20   [provider]  levothyroxine (SYNTHROID) 75 MCG tablet Take 75 mcg by mouth daily before breakfast. 08/05/18   [provider]  metoCLOPramide (REGLAN) 5 MG tablet TAKE 1 TABLET BY MOUTH THREE TIMES DAILY BEFORE MEAL(S). UNTIL VOMITIN RESOLVED, THEN USE AS NEEDED 09/10/22   Tiffany Kocher, PA-C  metoprolol succinate (TOPROL-XL) 25 MG 24 hr tablet Take 25 mg by mouth daily. 05/20/20   [provider]  mycophenolate (CELLCEPT) 500 MG tablet Take 500 mg by mouth 2 (two) times daily. 01/21/22   [provider]  ondansetron (ZOFRAN-ODT) 4 MG disintegrating tablet DISSOLVE 1 TABLET IN MOUTH EVERY 8 HOURS AS NEEDED 01/22/22   Tiffany Kocher, PA-C  OZEMPIC, 0.25 OR 0.5 MG/DOSE, 2 MG/3ML SOPN Inject 0.5 mg into  the skin every Thursday.    [provider]  pantoprazole (PROTONIX) 40 MG tablet Take 40 mg by mouth daily. 07/19/22   Tiffany Kocher, PA-C  polyethylene glycol powder (GLYCOLAX/MIRALAX) 17 GM/SCOOP powder Take 17 g by mouth daily.    [provider]      Allergies    Codeine, Insulin glargine, and Latex    Review of Systems   Review of Systems  Constitutional: Negative.   HENT: Negative.  Negative for ear pain.   Respiratory: Negative.    All other systems reviewed and are negative.   Physical Exam Updated Vital Signs BP 137/78 (BP Location: Right Arm)   Pulse 76   Temp 98.7 F (37.1 C) (Oral)   Resp 16   Ht 5' (1.524 m)   Wt 70.3 kg   SpO2 99%   BMI 30.27 kg/m  Physical Exam Constitutional:      Appearance: She is well-developed.  HENT:     Head: Normocephalic and atraumatic.     Right Ear: Tympanic membrane and ear canal normal.     Left Ear: Tympanic membrane and ear canal normal.     Ears:     Comments: No foreign body    Mouth/Throat:     Mouth: Mucous membranes are moist.  Eyes:     Conjunctiva/sclera: Conjunctivae normal.  Cardiovascular:  Rate and Rhythm: Normal rate.  Pulmonary:     Effort: Pulmonary effort is normal.  Skin:    General: Skin is warm and dry.  Neurological:     Mental Status: She is alert and oriented to person, place, and time.     ED Results / Procedures / Treatments   Labs (all labs ordered are listed, but only abnormal results are displayed) Labs Reviewed - No data to display  EKG None  Radiology No results found.  Procedures Procedures    Medications Ordered in ED Medications - No data to display  ED Course/ Medical Decision Making/ A&P                             Medical Decision Making          Final Clinical Impression(s) / ED Diagnoses Final diagnoses:  Worried well    Rx / DC Orders ED Discharge Orders     None         Victoriano Lain 10/28/22 1840     Bethann Berkshire, MD 10/29/22 [phone]

## 2022-11-01 ENCOUNTER — Encounter (HOSPITAL_COMMUNITY): Payer: Self-pay | Admitting: Internal Medicine

## 2022-11-01 NOTE — Telephone Encounter (Signed)
Spoke with pt on phone and scheduled f/u with Dr. Jena Gauss.

## 2022-11-02 ENCOUNTER — Other Ambulatory Visit: Payer: Self-pay | Admitting: Gastroenterology

## 2022-11-02 DIAGNOSIS — K3184 Gastroparesis: Secondary | ICD-10-CM

## 2022-11-05 DIAGNOSIS — N1832 Chronic kidney disease, stage 3b: Secondary | ICD-10-CM | POA: Diagnosis not present

## 2022-11-05 DIAGNOSIS — I129 Hypertensive chronic kidney disease with stage 1 through stage 4 chronic kidney disease, or unspecified chronic kidney disease: Secondary | ICD-10-CM | POA: Diagnosis not present

## 2022-11-05 DIAGNOSIS — N269 Renal sclerosis, unspecified: Secondary | ICD-10-CM | POA: Diagnosis not present

## 2022-11-05 DIAGNOSIS — N119 Chronic tubulo-interstitial nephritis, unspecified: Secondary | ICD-10-CM | POA: Diagnosis not present

## 2022-11-16 ENCOUNTER — Ambulatory Visit: Payer: BC Managed Care – PPO | Admitting: Internal Medicine

## 2022-11-30 ENCOUNTER — Encounter: Payer: Self-pay | Admitting: Internal Medicine

## 2022-11-30 ENCOUNTER — Ambulatory Visit: Payer: BC Managed Care – PPO | Admitting: Internal Medicine

## 2022-11-30 VITALS — BP 128/77 | HR 67 | Temp 98.6°F | Ht 61.0 in | Wt 159.8 lb

## 2022-11-30 DIAGNOSIS — R110 Nausea: Secondary | ICD-10-CM | POA: Diagnosis not present

## 2022-11-30 NOTE — Progress Notes (Signed)
Primary Care Physician:  Benita Stabile, MD Primary Gastroenterologist:  Dr. Jena Gauss  Pre-Procedure History & Physical: HPI:  [name] [name] is a 57 y.o. female here for intermittent nausea some vomiting.  Seen earlier this year.  To have an element of gastroparesis.  Patient underwent an EGD which revealed a tiny hiatal hernia otherwise upper GI tract appeared normal.  Retrospectively, patient realize she was taking 2 of her blood pressure medication simultaneously every day and feels that might have been contributing to her nausea.  However, she was also taking Ozempic.  She got her medications straightened out and stopped Ozempic and her symptoms resolved.  She has now started back on Ozempic on an escalating regimen.  Currently taking 0.5 mg weekly.  No symptoms at this time(had a lengthy discussion about the myriad of symptoms GI symptoms 1 can expect potentially with Ozempic).  Negative screening colonoscopy 2016; will be due for average rescreening 2026.   Incidentally, she has a prescription for Reglan at home but has not taken any since her last visit.  Past Medical History:  Diagnosis Date   Anemia of chronic disease    Diabetes (HCC)    Gastroparesis    Grave's disease    diagnosed years ago/ now hypothyroidism   Hypertension    IBS (irritable colon syndrome)    constipation predominant    Past Surgical History:  Procedure Laterality Date   ABDOMINAL HYSTERECTOMY     attempted colonoscopy  03/2004   Dr. --> prep was poor. Exam to 40 cm. No gross abnormalities. Internal hemorrhoids. Air contrast barium enema was normal.   CHOLECYSTECTOMY  2010   COLONOSCOPY N/A 10/30/2014   RMR: normal. next colonoscopy in 2026.   ESOPHAGEAL DILATION N/A 10/30/2014   Procedure: ESOPHAGEAL DILATION;  Surgeon: Corbin Ade, MD;  Location: [facility];  Service: Endoscopy;  Laterality: N/A;   ESOPHAGOGASTRODUODENOSCOPY  01/2004   Dr. Vashti Hey small polyps in postbulbar  area ablated   ESOPHAGOGASTRODUODENOSCOPY N/A 10/30/2014   QIO:NGEXBM   ESOPHAGOGASTRODUODENOSCOPY (EGD) WITH PROPOFOL N/A 06/13/2020    Surgeon: Corbin Ade, MD;  Normal exam   ESOPHAGOGASTRODUODENOSCOPY (EGD) WITH PROPOFOL N/A 10/25/2022   Procedure: ESOPHAGOGASTRODUODENOSCOPY (EGD) WITH PROPOFOL;  Surgeon: Corbin Ade, MD;  Location: [facility];  Service: Endoscopy;  Laterality: N/A;  1115AM, ASA 2   S/P Hysterectomy      TONSILLECTOMY     TUBAL LIGATION      Prior to Admission medications   Medication Sig Start Date End Date Taking? Authorizing Provider  acetaminophen (TYLENOL) 325 MG tablet Take 650 mg by mouth every 6 (six) hours as needed for moderate pain.   Yes [provider]  atorvastatin (LIPITOR) 20 MG tablet Take 20 mg by mouth daily.   Yes [provider]  blood glucose meter kit and supplies Use to check your blood sugar three times a day as directed. (FOR ICD-10 E10.9, E11.9). 09/07/18  Yes [name], MD  estradiol (ESTRACE) 1 MG tablet Take 1 mg by mouth daily. 09/30/20  Yes [provider]  levothyroxine (SYNTHROID) 75 MCG tablet Take 75 mcg by mouth daily before breakfast. 08/05/18  Yes [provider]  metoCLOPramide (REGLAN) 5 MG tablet TAKE 1 TABLET BY MOUTH THREE TIMES DAILY BEFORE MEAL(S). UNTIL VOMITIN RESOLVED, THEN USE AS NEEDED 09/10/22  Yes Tiffany Kocher, PA-C  metoprolol succinate (TOPROL-XL) 25 MG 24 hr tablet Take 25 mg by mouth daily. 05/20/20  Yes [provider]  ondansetron (ZOFRAN-ODT) 4 MG disintegrating tablet DISSOLVE 1 TABLET IN MOUTH EVERY 8 HOURS AS NEEDED 11/03/22  Yes Tiffany Kocher, PA-C  OZEMPIC, 0.25 OR 0.5 MG/DOSE, 2 MG/3ML SOPN Inject 0.5 mg into the skin every Thursday.   Yes [provider]  pantoprazole (PROTONIX) 40 MG tablet Take 40 mg by mouth daily. 07/19/22  Yes Tiffany Kocher, PA-C  polyethylene glycol powder (GLYCOLAX/MIRALAX) 17 GM/SCOOP powder Take 17 g by mouth  daily.   Yes [provider]    Allergies as of 11/30/2022 - Review Complete 11/30/2022  Allergen Reaction Noted   Codeine Nausea And Vomiting    Insulin glargine Itching 01/22/2022   Latex Itching 07/03/2021    Family History  Problem Relation Age of Onset   Other Father 46       hit by train   Heart disease Mother    Kidney disease Mother    Colon cancer Neg Hx     Social History   Socioeconomic History   Marital status: Single    Spouse name: Not on file   Number of children: 1   Years of education: Not on file   Highest education level: Not on file  Occupational History   Occupation: unemployed since July 3    Employer: [employer]  Tobacco Use   Smoking status: Former    Types: E-cigarettes   Smokeless tobacco: Never  Advertising account planner   Vaping status: Never Used  Substance and Sexual Activity   Alcohol use: Not Currently    Comment: occ   Drug use: No   Sexual activity: Yes    Birth control/protection: Surgical    Comment: hyst  Other Topics Concern   Not on file  Social History Narrative   1 grown healthy daughter   Social Determinants of Health   Financial Resource Strain: Not on file  Food Insecurity: Not on file  Transportation Needs: Not on file  Physical Activity: Not on file  Stress: Not on file  Social Connections: Not on file  Intimate Partner Violence: Not on file    Review of Systems: See HPI, otherwise negative ROS  Physical Exam: BP 128/77 (BP Location: Right Arm, Patient Position: Sitting, Cuff Size: Normal)   Pulse 67   Temp 98.6 F (37 C) (Oral)   Ht 5\' 1"  (1.549 m)   Wt 159 lb 12.8 oz (72.5 kg)   SpO2 100%   BMI 30.19 kg/m  General:   Alert,  Well-developed, well-nourished, pleasant and cooperative in NAD  Impression/Plan: Pleasant 57 year old lady with a history of diabetes and working diagnosis of gastroparesis with a history of nausea and some vomiting earlier this year.  EGD essentially negative as  outlined above.  She was taking excessive doses of one of her BP meds and also taking Ozempic temporal to the onset of her nausea symptoms.  She got her medications straightened out and stopped Ozempic and her symptoms have resolved.  She is reapproached Ozempic and is slowly escalating therapy.  As discussed with her, she will really need to watch her oral intake of nutrition as she would be at risk for recurrent GI symptoms related to this medication.  Recommendations:  Continue pantoprazole or Protonix 40 mg daily best taken 30 minutes before breakfast  We have discussed the potential GI side effects of Ozempic.  I would like you to avoid taking Reglan.  If you have to resume taking Reglan for symptoms of nausea, please let me know.  Side effects have been reviewed.  Unless something comes up, we will plan to see you back in the office in 2 years to reassess and set up a screening colonoscopy.  Call me right away if you have any interim problems. Notice: This dictation was prepared with Dragon dictation along with smaller phrase technology. Any transcriptional errors that result from this process are unintentional and may not be corrected upon review.

## 2022-11-30 NOTE — Patient Instructions (Signed)
It was good to see you today!  I am glad your GI symptoms have resolved.  Continue pantoprazole or Protonix 40 mg daily best taken 30 minutes before breakfast  We have discussed the potential GI side effects of Ozempic.  I would like you to avoid taking Reglan.  If you have to resume taking Reglan for symptoms of nausea, please let me know.  Side effects have been reviewed.  Unless something comes up, we will plan to see you back in the office in 2 years to reassess and set up a screening colonoscopy.  Call me right away if you have any interim problems.

## 2023-01-02 ENCOUNTER — Other Ambulatory Visit: Payer: Self-pay | Admitting: Gastroenterology

## 2023-01-02 DIAGNOSIS — K3184 Gastroparesis: Secondary | ICD-10-CM

## 2023-02-02 ENCOUNTER — Other Ambulatory Visit (HOSPITAL_COMMUNITY)
Admission: RE | Admit: 2023-02-02 | Discharge: 2023-02-02 | Disposition: A | Discharge status: Home / Self Care | Payer: BC Managed Care – PPO | Source: Ambulatory Visit | Attending: Nephrology | Admitting: Nephrology

## 2023-02-02 DIAGNOSIS — N119 Chronic tubulo-interstitial nephritis, unspecified: Secondary | ICD-10-CM | POA: Diagnosis not present

## 2023-02-02 DIAGNOSIS — E875 Hyperkalemia: Secondary | ICD-10-CM | POA: Diagnosis not present

## 2023-02-02 DIAGNOSIS — R809 Proteinuria, unspecified: Secondary | ICD-10-CM | POA: Insufficient documentation

## 2023-02-02 DIAGNOSIS — N1832 Chronic kidney disease, stage 3b: Secondary | ICD-10-CM | POA: Diagnosis not present

## 2023-02-02 DIAGNOSIS — N269 Renal sclerosis, unspecified: Secondary | ICD-10-CM | POA: Insufficient documentation

## 2023-02-02 DIAGNOSIS — N189 Chronic kidney disease, unspecified: Secondary | ICD-10-CM | POA: Diagnosis not present

## 2023-02-02 LAB — RENAL FUNCTION PANEL
Albumin: 3.6 g/dL (ref 3.5–5.0)
Anion gap: 8 (ref 5–15)
BUN: 13 mg/dL (ref 6–20)
CO2: 26 mmol/L (ref 22–32)
Calcium: 8.8 mg/dL — ABNORMAL LOW (ref 8.9–10.3)
Chloride: 103 mmol/L (ref 98–111)
Creatinine, Ser: 1.79 mg/dL — ABNORMAL HIGH (ref 0.44–1.00)
GFR, Estimated: 32 mL/min — ABNORMAL LOW (ref >60)
Glucose, Bld: 81 mg/dL (ref 70–99)
Phosphorus: 3 mg/dL (ref 2.5–4.6)
Potassium: 4.5 mmol/L (ref 3.5–5.1)
Sodium: 137 mmol/L (ref 135–145)

## 2023-02-02 LAB — CBC
HCT: 36.3 % (ref 36.0–46.0)
Hemoglobin: 10.9 g/dL — ABNORMAL LOW (ref 12.0–15.0)
MCH: 27.5 pg (ref 26.0–34.0)
MCHC: 30 g/dL (ref 30.0–36.0)
MCV: 91.7 fL (ref 80.0–100.0)
Platelets: 276 10*3/uL (ref 150–400)
RBC: 3.96 MIL/uL (ref 3.87–5.11)
RDW: 13.5 % (ref 11.5–15.5)
WBC: 4.7 10*3/uL (ref 4.0–10.5)
nRBC: 0 % (ref 0.0–0.2)

## 2023-02-02 LAB — PROTEIN / CREATININE RATIO, URINE
Creatinine, Urine: 182 mg/dL
Protein Creatinine Ratio: 0.07 mg/mg{creat} (ref 0.00–0.15)
Total Protein, Urine: 12 mg/dL

## 2023-02-04 LAB — PARATHYROID HORMONE, INTACT (NO CA): PTH: 33 pg/mL (ref 15–65)

## 2023-02-07 DIAGNOSIS — N119 Chronic tubulo-interstitial nephritis, unspecified: Secondary | ICD-10-CM | POA: Diagnosis not present

## 2023-02-07 DIAGNOSIS — N269 Renal sclerosis, unspecified: Secondary | ICD-10-CM | POA: Diagnosis not present

## 2023-02-07 DIAGNOSIS — N1832 Chronic kidney disease, stage 3b: Secondary | ICD-10-CM | POA: Diagnosis not present

## 2023-02-07 DIAGNOSIS — Z6829 Body mass index (BMI) 29.0-29.9, adult: Secondary | ICD-10-CM | POA: Diagnosis not present

## 2023-03-21 ENCOUNTER — Encounter: Payer: Self-pay | Admitting: Internal Medicine

## 2023-03-23 NOTE — Telephone Encounter (Signed)
Pt was made aware and verbalized understanding.  

## 2023-04-01 DIAGNOSIS — E1165 Type 2 diabetes mellitus with hyperglycemia: Secondary | ICD-10-CM | POA: Diagnosis not present

## 2023-04-01 DIAGNOSIS — E782 Mixed hyperlipidemia: Secondary | ICD-10-CM | POA: Diagnosis not present

## 2023-04-08 DIAGNOSIS — E118 Type 2 diabetes mellitus with unspecified complications: Secondary | ICD-10-CM | POA: Diagnosis not present

## 2023-04-08 DIAGNOSIS — E782 Mixed hyperlipidemia: Secondary | ICD-10-CM | POA: Diagnosis not present

## 2023-04-08 DIAGNOSIS — E1122 Type 2 diabetes mellitus with diabetic chronic kidney disease: Secondary | ICD-10-CM | POA: Diagnosis not present

## 2023-04-08 DIAGNOSIS — M2550 Pain in unspecified joint: Secondary | ICD-10-CM | POA: Diagnosis not present

## 2023-04-08 DIAGNOSIS — E663 Overweight: Secondary | ICD-10-CM | POA: Diagnosis not present

## 2023-04-08 DIAGNOSIS — Z0000 Encounter for general adult medical examination without abnormal findings: Secondary | ICD-10-CM | POA: Diagnosis not present

## 2023-04-12 IMAGING — US US RENAL
1 series · 14 of 25 positions shown · non-contrast
Comparison: 02/15/2020

CLINICAL DATA: Chronic renal insufficiency, type 2 diabetes

EXAM:
RENAL / URINARY TRACT ULTRASOUND COMPLETE

[Series 1: us renal · 14 of 62 slices shown]
[im 1/62]
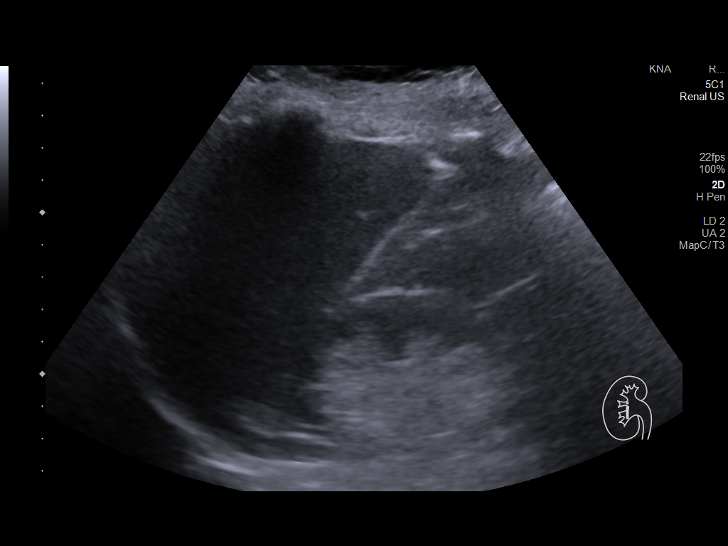
[im 6/62]
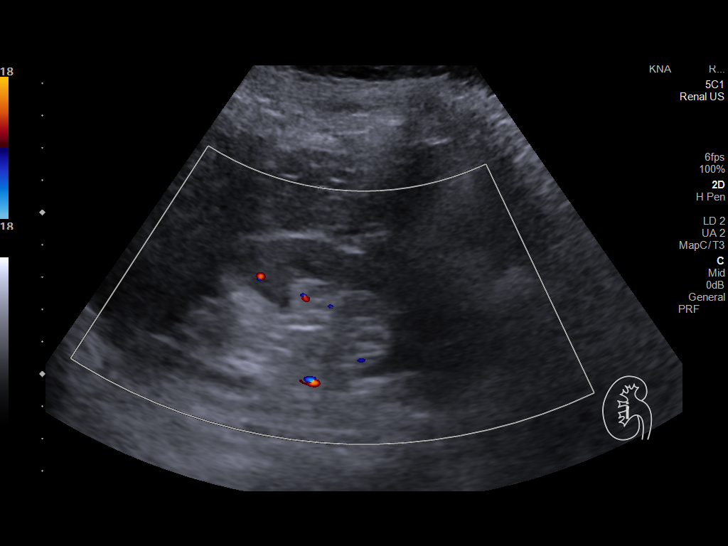
[im 11/62]
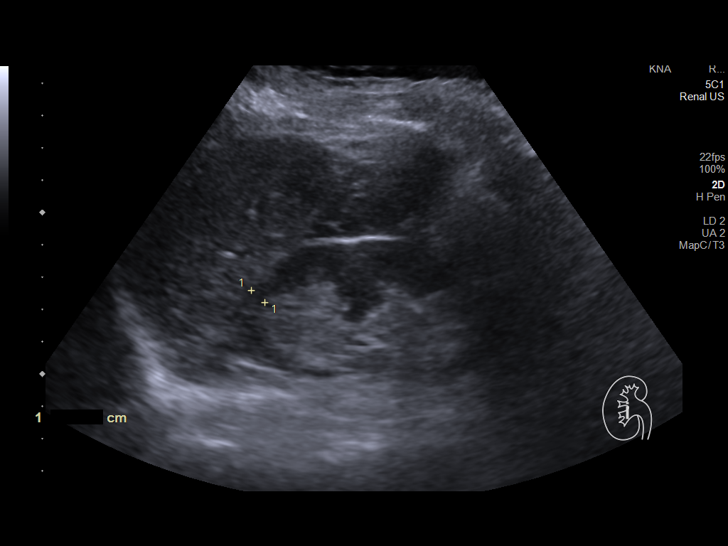
[im 16/62]
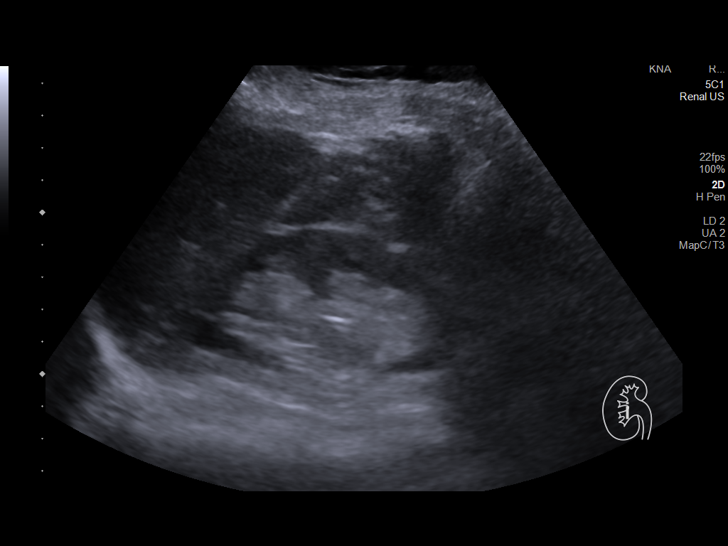
[im 21/62]
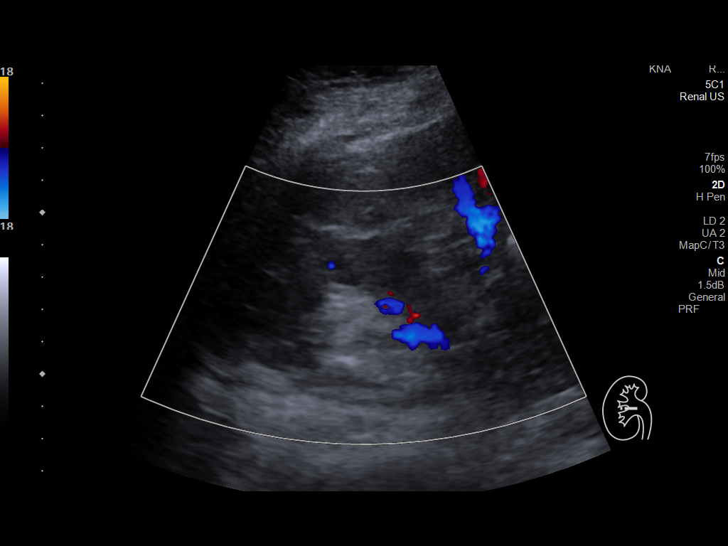
[im 23/62]
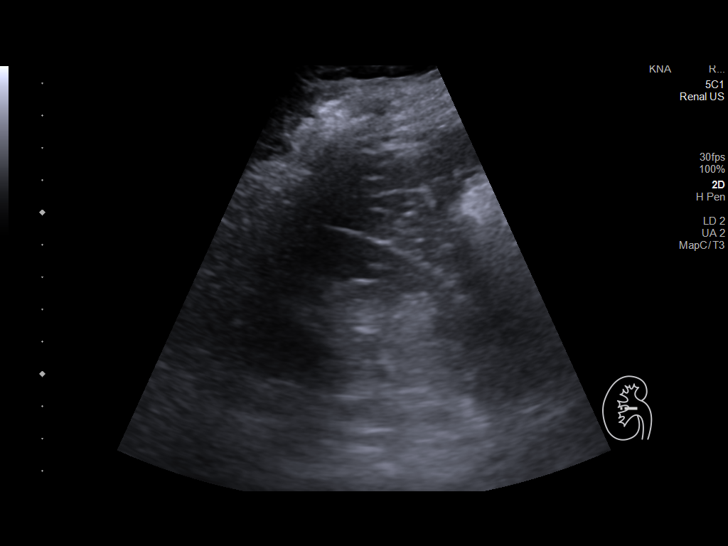
[im 28/62]
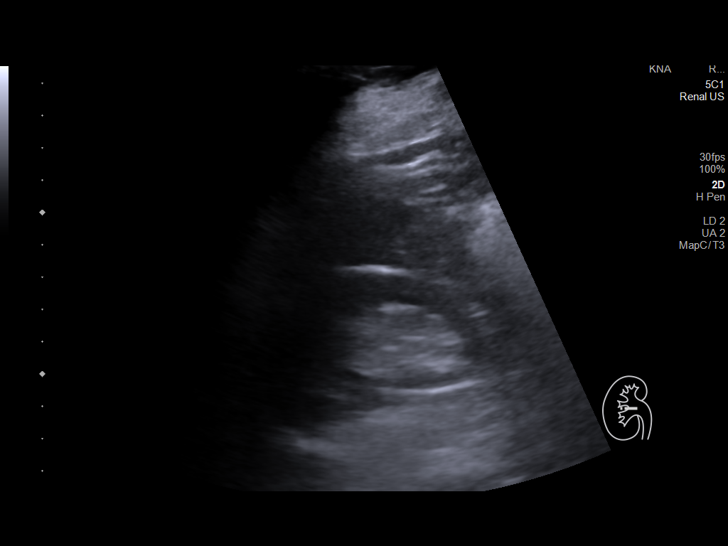
[im 34/62]
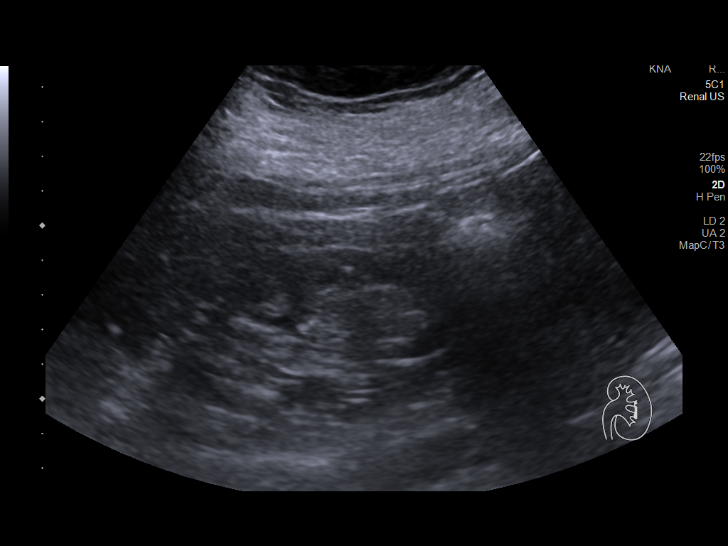
[im 39/62]
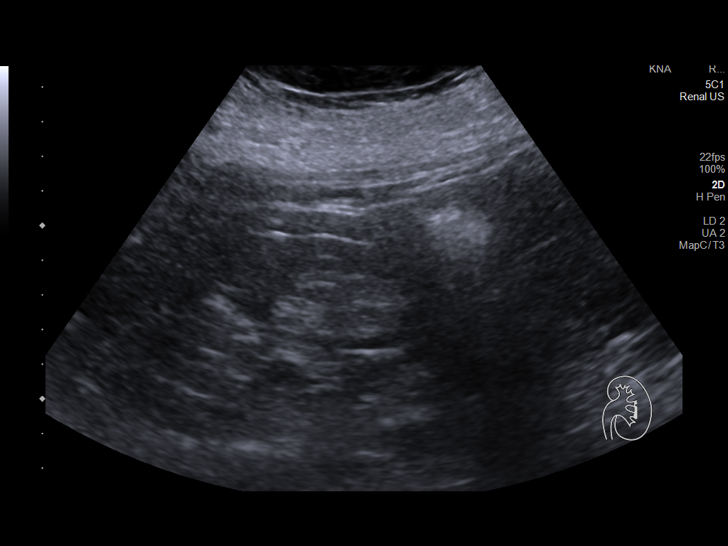
[im 41/62]
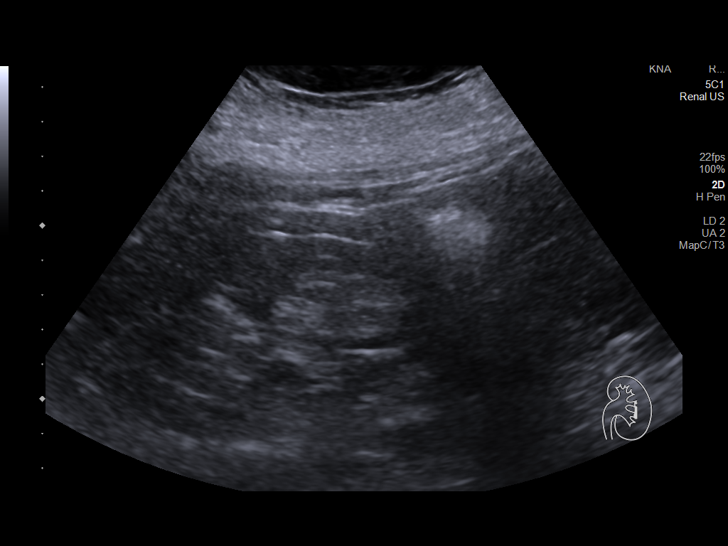
[im 46/62]
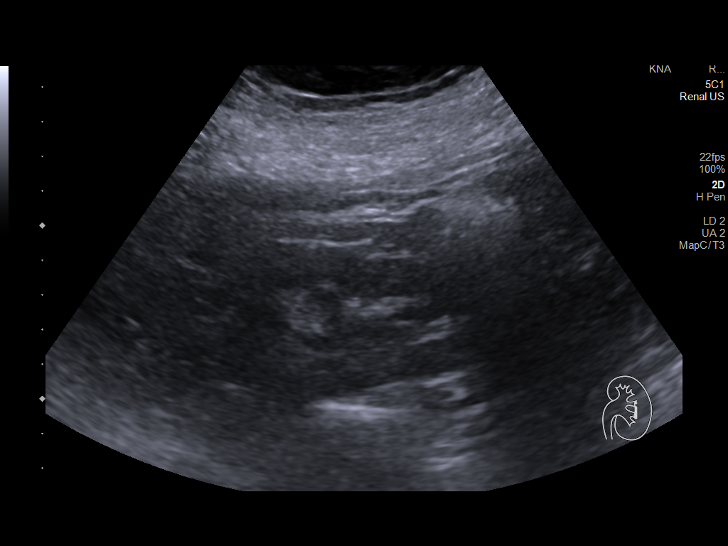
[im 51/62]
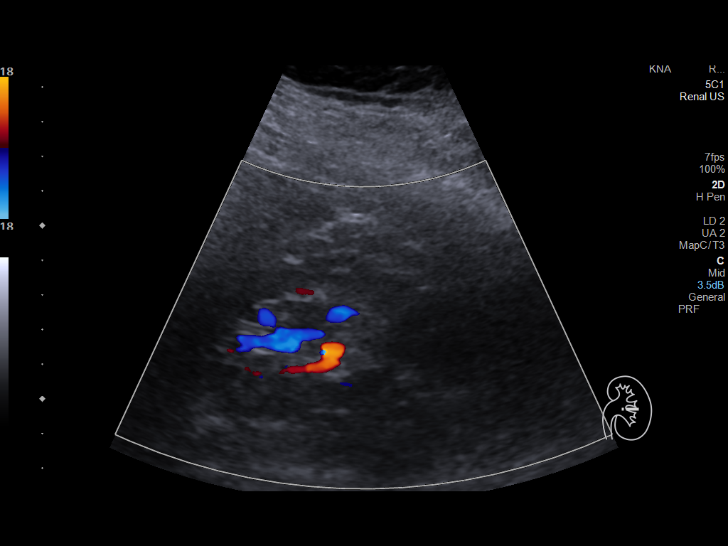
[im 56/62]
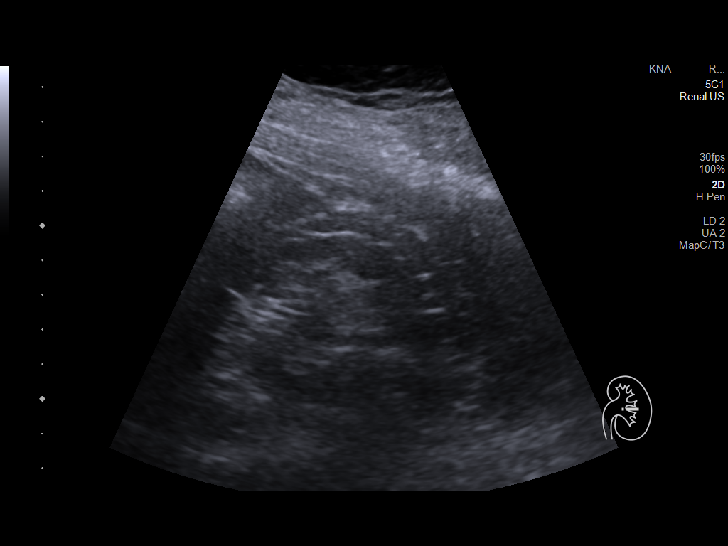
[im 62/62]
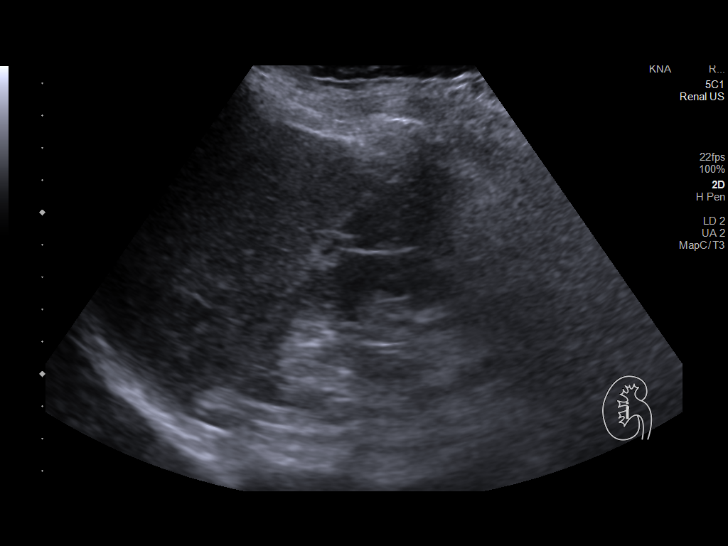

[14 of 25 positions shown; findings below may reference images not displayed]

FINDINGS: Right Kidney:

Renal measurements: 8.9 x 4.3 by 4.6 cm = volume: 92.9 mL. Mild
cortical thinning. Echotexture is normal. No hydronephrosis.

Left Kidney:

Renal measurements: 8.8 x 4.7 by 5.0 cm = volume: 106.6 mL. Thinning
of the renal cortex. Echotexture is normal. No hydronephrosis.

Bladder:

Appears normal for degree of bladder distention.

Other:

None.
IMPRESSION: 1. Bilateral renal cortical thinning, left greater than right.
2. Otherwise unremarkable exam.

## 2023-05-16 DIAGNOSIS — N269 Renal sclerosis, unspecified: Secondary | ICD-10-CM | POA: Diagnosis not present

## 2023-05-16 DIAGNOSIS — N119 Chronic tubulo-interstitial nephritis, unspecified: Secondary | ICD-10-CM | POA: Diagnosis not present

## 2023-05-16 DIAGNOSIS — N1832 Chronic kidney disease, stage 3b: Secondary | ICD-10-CM | POA: Diagnosis not present

## 2023-05-16 DIAGNOSIS — I129 Hypertensive chronic kidney disease with stage 1 through stage 4 chronic kidney disease, or unspecified chronic kidney disease: Secondary | ICD-10-CM | POA: Diagnosis not present

## 2023-05-23 ENCOUNTER — Other Ambulatory Visit (HOSPITAL_COMMUNITY)
Admission: RE | Admit: 2023-05-23 | Discharge: 2023-05-23 | Disposition: A | Discharge status: Home / Self Care | Payer: BC Managed Care – PPO | Source: Ambulatory Visit | Attending: Obstetrics & Gynecology | Admitting: Obstetrics & Gynecology

## 2023-05-23 ENCOUNTER — Encounter: Payer: Self-pay | Admitting: Obstetrics & Gynecology

## 2023-05-23 ENCOUNTER — Ambulatory Visit: Payer: BC Managed Care – PPO | Admitting: Obstetrics & Gynecology

## 2023-05-23 VITALS — BP 137/81 | HR 75 | Ht 60.0 in | Wt 149.0 lb

## 2023-05-23 DIAGNOSIS — Z01411 Encounter for gynecological examination (general) (routine) with abnormal findings: Secondary | ICD-10-CM

## 2023-05-23 DIAGNOSIS — B3731 Acute candidiasis of vulva and vagina: Secondary | ICD-10-CM | POA: Insufficient documentation

## 2023-05-23 DIAGNOSIS — Z113 Encounter for screening for infections with a predominantly sexual mode of transmission: Secondary | ICD-10-CM

## 2023-05-23 DIAGNOSIS — N898 Other specified noninflammatory disorders of vagina: Secondary | ICD-10-CM | POA: Insufficient documentation

## 2023-05-23 NOTE — Progress Notes (Signed)
WELL-WOMAN EXAMINATION Patient name: [name] [name] MRN [id number]  Date of birth: [birth year] Chief Complaint:   Annual Exam  History of Present Illness:   [name] [name] is a 58 y.o. G1P1001 PM, PH female being seen today for a routine well-woman exam.   Currently on HRT- taking estrace daily.  Mostly notes hot flashes about 2 per day and some difficulty at night.  Will wake up and then have difficulty falling back asleep, but this is not bothersome to her.    Vaginal irritation: Notes vaginal soreness that started this past Friday.  No discharge, no odor.  On occasion dyspareunia, using lubricant.  No longer using vaginal estrogen and does not feel as though she currently needs this.  Her biggest concern is making sure that she does not have an infection.  Sexually active with 2 partners, unprotected intercourse    No LMP recorded. Patient has had a hysterectomy.  The current method of family planning is status post hysterectomy.    Last pap NA.  Last mammogram: 09/2022. Last colonoscopy: 2016     05/23/2023    9:51 AM 10/10/2017   10:27 AM 06/18/2015    1:37 PM  Depression screen PHQ 2/9  Decreased Interest 0 0 0  Down, Depressed, Hopeless 0 0 0  PHQ - 2 Score 0 0 0  Altered sleeping 0    Tired, decreased energy 3    Change in appetite 0    Feeling bad or failure about yourself  0    Trouble concentrating 0    Moving slowly or fidgety/restless 0    Suicidal thoughts 0    PHQ-9 Score 3        Review of Systems:   Pertinent items are noted in HPI Denies any headaches, blurred vision, fatigue, shortness of breath, chest pain, abdominal pain, bowel movements, urination, or intercourse unless otherwise stated above.  Pertinent History Reviewed:  Reviewed past medical,surgical, social and family history.  Reviewed problem list, medications and allergies. Physical Assessment:   Vitals:   05/23/23 0950 05/23/23 0957  BP: (!) 147/74 137/81  Pulse: 75   Weight: 149  lb (67.6 kg)   Height: 5' (1.524 m)   Body mass index is 29.1 kg/m.        Physical Examination:   General appearance - well appearing, and in no distress  Mental status - alert, oriented to person, place, and time  Psych:  She has a normal mood and affect  Skin - warm and dry, normal color, no suspicious lesions noted  Chest - effort normal, all lung fields clear to auscultation bilaterally  Heart - normal rate and regular rhythm  Neck:  midline trachea, no thyromegaly or nodules  Breasts - breasts appear normal, no suspicious masses, no skin or nipple changes or  axillary nodes  Abdomen - soft, nontender, nondistended, no masses or organomegaly  Pelvic - VULVA: normal appearing vulva with no masses, tenderness or lesions  VAGINA: clumpy to copious white discharge noted Uterus and cervix surgically absent  ADNEXA: No adnexal masses or tenderness noted.  Extremities:  No swelling or varicosities noted  Chaperone:  pt declined      Assessment & Plan:  1) Well-Woman Exam -pap no longer indicated s/p hysterectomy for benign condition -mammogram and colonoscopy up to date  2) Vaginal irritation -concern for yeast infection -vaginitis panel obtained including STI screening  Orders Placed This Encounter  Procedures   HIV Antibody (routine testing w rflx)  RPR    Meds: No orders of the defined types were placed in this encounter.   Follow-up: Return for as needed 27yr annual.   Myna Hidalgo, DO Attending Obstetrician & Gynecologist, [facility] for [employer], [facility]

## 2023-05-24 ENCOUNTER — Encounter: Payer: Self-pay | Admitting: Obstetrics & Gynecology

## 2023-05-24 ENCOUNTER — Other Ambulatory Visit: Payer: Self-pay | Admitting: Obstetrics & Gynecology

## 2023-05-24 DIAGNOSIS — N898 Other specified noninflammatory disorders of vagina: Secondary | ICD-10-CM

## 2023-05-24 LAB — CERVICOVAGINAL ANCILLARY ONLY
Bacterial Vaginitis (gardnerella): NEGATIVE
Candida Glabrata: NEGATIVE
Candida Vaginitis: POSITIVE — AB
Chlamydia: NEGATIVE
Comment: NEGATIVE
Comment: NEGATIVE
Comment: NEGATIVE
Comment: NEGATIVE
Comment: NEGATIVE
Comment: NORMAL
Neisseria Gonorrhea: NEGATIVE
Trichomonas: NEGATIVE

## 2023-05-24 LAB — HIV ANTIBODY (ROUTINE TESTING W REFLEX): HIV Screen 4th Generation wRfx: NONREACTIVE

## 2023-05-24 LAB — RPR: RPR Ser Ql: NONREACTIVE

## 2023-05-24 MED ORDER — FLUCONAZOLE 150 MG PO TABS: ORAL_TABLET | ORAL | 1 refills | Status: DC

## 2023-05-31 ENCOUNTER — Other Ambulatory Visit (INDEPENDENT_AMBULATORY_CARE_PROVIDER_SITE_OTHER): Payer: BC Managed Care – PPO | Admitting: *Deleted

## 2023-05-31 DIAGNOSIS — R350 Frequency of micturition: Secondary | ICD-10-CM

## 2023-05-31 NOTE — Progress Notes (Signed)
   NURSE VISIT- UTI SYMPTOMS   SUBJECTIVE:  [name] [name] is a 58 y.o. G83P1001 female here for UTI symptoms. She is a GYN patient. She reports urinary frequency.  OBJECTIVE:  There were no vitals taken for this visit.  Appears well, in no apparent distress    ASSESSMENT: GYN patient with UTI symptoms. Unable to dip urine due to color of urine. Pt has taken AZO.  PLAN: Note routed to Delon Lewis, AGNP   Rx sent by provider today: No Urine culture sent Call or return to clinic prn if these symptoms worsen or fail to improve as anticipated. Follow-up: as needed   Clarita Salt  05/31/2023 3:24 PM

## 2023-06-01 LAB — URINALYSIS, ROUTINE W REFLEX MICROSCOPIC
Glucose, UA: NEGATIVE
Ketones, UA: NEGATIVE
Nitrite, UA: POSITIVE — AB
Specific Gravity, UA: 1.017 (ref 1.005–1.030)
Urobilinogen, Ur: 1 mg/dL (ref 0.2–1.0)
pH, UA: 5 (ref 5.0–7.5)

## 2023-06-01 LAB — MICROSCOPIC EXAMINATION
Bacteria, UA: NONE SEEN
Casts: NONE SEEN /[LPF]
WBC, UA: >30 /[HPF] — AB (ref 0–5)

## 2023-06-02 LAB — URINE CULTURE

## 2023-06-03 ENCOUNTER — Other Ambulatory Visit: Payer: Self-pay | Admitting: Adult Health

## 2023-06-03 MED ORDER — AMPICILLIN 500 MG PO CAPS: 500.0000 mg | ORAL_CAPSULE | Freq: Three times a day (TID) | ORAL | 0 refills | Status: DC

## 2023-06-03 NOTE — Progress Notes (Signed)
 Urine culture is + fir group B streptococcus , I have sent in Rx for ampicillin , and push fluids

## 2023-06-09 ENCOUNTER — Other Ambulatory Visit: Payer: Self-pay | Admitting: Internal Medicine

## 2023-06-09 DIAGNOSIS — K3184 Gastroparesis: Secondary | ICD-10-CM

## 2023-06-29 ENCOUNTER — Other Ambulatory Visit: Payer: Self-pay | Admitting: Adult Health

## 2023-06-29 DIAGNOSIS — N898 Other specified noninflammatory disorders of vagina: Secondary | ICD-10-CM

## 2023-06-29 MED ORDER — FLUCONAZOLE 150 MG PO TABS: ORAL_TABLET | ORAL | 1 refills | Status: DC

## 2023-06-29 NOTE — Progress Notes (Signed)
 Refilled diflucan

## 2023-08-03 ENCOUNTER — Other Ambulatory Visit (HOSPITAL_COMMUNITY)
Admission: RE | Admit: 2023-08-03 | Discharge: 2023-08-03 | Disposition: A | Discharge status: Home / Self Care | Source: Ambulatory Visit | Attending: Nephrology | Admitting: Nephrology

## 2023-08-03 DIAGNOSIS — R809 Proteinuria, unspecified: Secondary | ICD-10-CM | POA: Diagnosis not present

## 2023-08-03 DIAGNOSIS — D631 Anemia in chronic kidney disease: Secondary | ICD-10-CM | POA: Insufficient documentation

## 2023-08-03 DIAGNOSIS — D7589 Other specified diseases of blood and blood-forming organs: Secondary | ICD-10-CM | POA: Diagnosis not present

## 2023-08-03 DIAGNOSIS — E211 Secondary hyperparathyroidism, not elsewhere classified: Secondary | ICD-10-CM | POA: Insufficient documentation

## 2023-08-03 DIAGNOSIS — D638 Anemia in other chronic diseases classified elsewhere: Secondary | ICD-10-CM | POA: Insufficient documentation

## 2023-08-03 DIAGNOSIS — D539 Nutritional anemia, unspecified: Secondary | ICD-10-CM | POA: Insufficient documentation

## 2023-08-03 DIAGNOSIS — N189 Chronic kidney disease, unspecified: Secondary | ICD-10-CM | POA: Insufficient documentation

## 2023-08-03 LAB — CBC
HCT: 38.7 % (ref 36.0–46.0)
Hemoglobin: 11.6 g/dL — ABNORMAL LOW (ref 12.0–15.0)
MCH: 28.2 pg (ref 26.0–34.0)
MCHC: 30 g/dL (ref 30.0–36.0)
MCV: 94.2 fL (ref 80.0–100.0)
Platelets: 260 10*3/uL (ref 150–400)
RBC: 4.11 MIL/uL (ref 3.87–5.11)
RDW: 13 % (ref 11.5–15.5)
WBC: 7.2 10*3/uL (ref 4.0–10.5)
nRBC: 0 % (ref 0.0–0.2)

## 2023-08-03 LAB — RENAL FUNCTION PANEL
Albumin: 3.6 g/dL (ref 3.5–5.0)
Anion gap: 8 (ref 5–15)
BUN: 14 mg/dL (ref 6–20)
CO2: 26 mmol/L (ref 22–32)
Calcium: 9 mg/dL (ref 8.9–10.3)
Chloride: 102 mmol/L (ref 98–111)
Creatinine, Ser: 1.93 mg/dL — ABNORMAL HIGH (ref 0.44–1.00)
GFR, Estimated: 30 mL/min — ABNORMAL LOW (ref >60)
Glucose, Bld: 128 mg/dL — ABNORMAL HIGH (ref 70–99)
Phosphorus: 2.6 mg/dL (ref 2.5–4.6)
Potassium: 5 mmol/L (ref 3.5–5.1)
Sodium: 136 mmol/L (ref 135–145)

## 2023-08-03 LAB — PROTEIN / CREATININE RATIO, URINE
Creatinine, Urine: 210 mg/dL
Protein Creatinine Ratio: 0.05 mg/mg{creat} (ref 0.00–0.15)
Total Protein, Urine: 10 mg/dL

## 2023-08-03 LAB — FOLATE: Folate: 8.4 ng/mL (ref >5.9)

## 2023-08-03 LAB — IRON AND TIBC
Iron: 59 ug/dL (ref 28–170)
Saturation Ratios: 18 % (ref 10.4–31.8)
TIBC: 329 ug/dL (ref 250–450)
UIBC: 270 ug/dL

## 2023-08-03 LAB — VITAMIN B12: Vitamin B-12: 265 pg/mL (ref 180–914)

## 2023-08-03 LAB — FERRITIN: Ferritin: 78 ng/mL (ref 11–307)

## 2023-08-04 LAB — PARATHYROID HORMONE, INTACT (NO CA): PTH: 48 pg/mL (ref 15–65)

## 2023-08-13 DIAGNOSIS — N119 Chronic tubulo-interstitial nephritis, unspecified: Secondary | ICD-10-CM | POA: Diagnosis not present

## 2023-08-13 DIAGNOSIS — N269 Renal sclerosis, unspecified: Secondary | ICD-10-CM | POA: Diagnosis not present

## 2023-08-13 DIAGNOSIS — N1832 Chronic kidney disease, stage 3b: Secondary | ICD-10-CM | POA: Diagnosis not present

## 2023-08-13 DIAGNOSIS — I129 Hypertensive chronic kidney disease with stage 1 through stage 4 chronic kidney disease, or unspecified chronic kidney disease: Secondary | ICD-10-CM | POA: Diagnosis not present

## 2023-08-17 ENCOUNTER — Other Ambulatory Visit: Payer: Self-pay | Admitting: Gastroenterology

## 2023-08-17 DIAGNOSIS — K3184 Gastroparesis: Secondary | ICD-10-CM

## 2023-08-22 DIAGNOSIS — R300 Dysuria: Secondary | ICD-10-CM | POA: Diagnosis not present

## 2023-09-30 DIAGNOSIS — E119 Type 2 diabetes mellitus without complications: Secondary | ICD-10-CM | POA: Diagnosis not present

## 2023-09-30 DIAGNOSIS — H43812 Vitreous degeneration, left eye: Secondary | ICD-10-CM | POA: Diagnosis not present

## 2023-10-03 DIAGNOSIS — M792 Neuralgia and neuritis, unspecified: Secondary | ICD-10-CM | POA: Diagnosis not present

## 2023-10-03 DIAGNOSIS — M722 Plantar fascial fibromatosis: Secondary | ICD-10-CM | POA: Diagnosis not present

## 2023-10-07 DIAGNOSIS — Z0000 Encounter for general adult medical examination without abnormal findings: Secondary | ICD-10-CM | POA: Diagnosis not present

## 2023-10-07 DIAGNOSIS — E039 Hypothyroidism, unspecified: Secondary | ICD-10-CM | POA: Diagnosis not present

## 2023-10-14 DIAGNOSIS — E1122 Type 2 diabetes mellitus with diabetic chronic kidney disease: Secondary | ICD-10-CM | POA: Diagnosis not present

## 2023-10-14 DIAGNOSIS — Z23 Encounter for immunization: Secondary | ICD-10-CM | POA: Diagnosis not present

## 2023-10-14 DIAGNOSIS — I10 Essential (primary) hypertension: Secondary | ICD-10-CM | POA: Diagnosis not present

## 2023-10-14 DIAGNOSIS — N184 Chronic kidney disease, stage 4 (severe): Secondary | ICD-10-CM | POA: Diagnosis not present

## 2023-10-14 DIAGNOSIS — E118 Type 2 diabetes mellitus with unspecified complications: Secondary | ICD-10-CM | POA: Diagnosis not present

## 2023-10-14 DIAGNOSIS — I129 Hypertensive chronic kidney disease with stage 1 through stage 4 chronic kidney disease, or unspecified chronic kidney disease: Secondary | ICD-10-CM | POA: Diagnosis not present

## 2023-10-14 DIAGNOSIS — E782 Mixed hyperlipidemia: Secondary | ICD-10-CM | POA: Diagnosis not present

## 2023-10-17 ENCOUNTER — Other Ambulatory Visit (HOSPITAL_COMMUNITY): Payer: Self-pay | Admitting: Nurse Practitioner

## 2023-10-17 DIAGNOSIS — Z1231 Encounter for screening mammogram for malignant neoplasm of breast: Secondary | ICD-10-CM

## 2023-10-31 ENCOUNTER — Other Ambulatory Visit: Payer: Self-pay | Admitting: Internal Medicine

## 2023-10-31 DIAGNOSIS — K3184 Gastroparesis: Secondary | ICD-10-CM

## 2023-11-14 ENCOUNTER — Encounter (HOSPITAL_COMMUNITY): Payer: Self-pay

## 2023-11-14 ENCOUNTER — Ambulatory Visit (HOSPITAL_COMMUNITY)
Admission: RE | Admit: 2023-11-14 | Discharge: 2023-11-14 | Disposition: A | Discharge status: Home / Self Care | Source: Ambulatory Visit | Attending: Nurse Practitioner | Admitting: Nurse Practitioner

## 2023-11-14 DIAGNOSIS — Z1231 Encounter for screening mammogram for malignant neoplasm of breast: Secondary | ICD-10-CM | POA: Insufficient documentation

## 2023-11-16 DIAGNOSIS — I129 Hypertensive chronic kidney disease with stage 1 through stage 4 chronic kidney disease, or unspecified chronic kidney disease: Secondary | ICD-10-CM | POA: Diagnosis not present

## 2023-11-16 DIAGNOSIS — N119 Chronic tubulo-interstitial nephritis, unspecified: Secondary | ICD-10-CM | POA: Diagnosis not present

## 2023-11-16 DIAGNOSIS — N269 Renal sclerosis, unspecified: Secondary | ICD-10-CM | POA: Diagnosis not present

## 2023-11-16 DIAGNOSIS — N1832 Chronic kidney disease, stage 3b: Secondary | ICD-10-CM | POA: Diagnosis not present

## 2023-12-16 DIAGNOSIS — N760 Acute vaginitis: Secondary | ICD-10-CM | POA: Diagnosis not present

## 2023-12-16 DIAGNOSIS — N343 Urethral syndrome, unspecified: Secondary | ICD-10-CM | POA: Diagnosis not present

## 2024-01-05 ENCOUNTER — Other Ambulatory Visit (HOSPITAL_COMMUNITY)
Admission: RE | Admit: 2024-01-05 | Discharge: 2024-01-05 | Disposition: A | Discharge status: Home / Self Care | Source: Ambulatory Visit | Attending: Nephrology | Admitting: Nephrology

## 2024-01-05 DIAGNOSIS — E211 Secondary hyperparathyroidism, not elsewhere classified: Secondary | ICD-10-CM | POA: Insufficient documentation

## 2024-01-05 DIAGNOSIS — R809 Proteinuria, unspecified: Secondary | ICD-10-CM | POA: Insufficient documentation

## 2024-01-05 DIAGNOSIS — D631 Anemia in chronic kidney disease: Secondary | ICD-10-CM | POA: Insufficient documentation

## 2024-01-05 DIAGNOSIS — N189 Chronic kidney disease, unspecified: Secondary | ICD-10-CM | POA: Diagnosis not present

## 2024-01-05 LAB — RENAL FUNCTION PANEL
Albumin: 3.4 g/dL — ABNORMAL LOW (ref 3.5–5.0)
Anion gap: 11 (ref 5–15)
BUN: 13 mg/dL (ref 6–20)
CO2: 26 mmol/L (ref 22–32)
Calcium: 8.8 mg/dL — ABNORMAL LOW (ref 8.9–10.3)
Chloride: 103 mmol/L (ref 98–111)
Creatinine, Ser: 1.74 mg/dL — ABNORMAL HIGH (ref 0.44–1.00)
GFR, Estimated: 33 mL/min — ABNORMAL LOW (ref >60)
Glucose, Bld: 84 mg/dL (ref 70–99)
Phosphorus: 3.4 mg/dL (ref 2.5–4.6)
Potassium: 4.2 mmol/L (ref 3.5–5.1)
Sodium: 140 mmol/L (ref 135–145)

## 2024-01-05 LAB — CBC
HCT: 36.2 % (ref 36.0–46.0)
Hemoglobin: 11 g/dL — ABNORMAL LOW (ref 12.0–15.0)
MCH: 28.3 pg (ref 26.0–34.0)
MCHC: 30.4 g/dL (ref 30.0–36.0)
MCV: 93.1 fL (ref 80.0–100.0)
Platelets: 228 K/uL (ref 150–400)
RBC: 3.89 MIL/uL (ref 3.87–5.11)
RDW: 13.1 % (ref 11.5–15.5)
WBC: 4.6 K/uL (ref 4.0–10.5)
nRBC: 0 % (ref 0.0–0.2)

## 2024-01-05 LAB — PROTEIN / CREATININE RATIO, URINE
Creatinine, Urine: 145 mg/dL
Protein Creatinine Ratio: 0.08 mg/mg{creat} (ref 0.00–0.15)
Total Protein, Urine: 11 mg/dL

## 2024-01-06 LAB — PARATHYROID HORMONE, INTACT (NO CA): PTH: 47 pg/mL (ref 15–65)

## 2024-01-10 ENCOUNTER — Encounter: Payer: Self-pay | Admitting: Internal Medicine

## 2024-01-12 ENCOUNTER — Other Ambulatory Visit: Payer: Self-pay | Admitting: Internal Medicine

## 2024-01-12 DIAGNOSIS — K3184 Gastroparesis: Secondary | ICD-10-CM

## 2024-01-13 ENCOUNTER — Encounter: Payer: Self-pay | Admitting: Internal Medicine

## 2024-01-13 DIAGNOSIS — I129 Hypertensive chronic kidney disease with stage 1 through stage 4 chronic kidney disease, or unspecified chronic kidney disease: Secondary | ICD-10-CM | POA: Diagnosis not present

## 2024-01-13 DIAGNOSIS — N269 Renal sclerosis, unspecified: Secondary | ICD-10-CM | POA: Diagnosis not present

## 2024-01-13 DIAGNOSIS — N1832 Chronic kidney disease, stage 3b: Secondary | ICD-10-CM | POA: Diagnosis not present

## 2024-01-13 DIAGNOSIS — N119 Chronic tubulo-interstitial nephritis, unspecified: Secondary | ICD-10-CM | POA: Diagnosis not present

## 2024-01-24 ENCOUNTER — Encounter: Payer: Self-pay | Admitting: Internal Medicine

## 2024-01-24 ENCOUNTER — Ambulatory Visit (INDEPENDENT_AMBULATORY_CARE_PROVIDER_SITE_OTHER): Admitting: Internal Medicine

## 2024-01-24 VITALS — BP 139/74 | HR 67 | Temp 97.0°F | Ht 60.0 in | Wt 146.0 lb

## 2024-01-24 DIAGNOSIS — K3184 Gastroparesis: Secondary | ICD-10-CM | POA: Diagnosis not present

## 2024-01-24 DIAGNOSIS — K5909 Other constipation: Secondary | ICD-10-CM | POA: Diagnosis not present

## 2024-01-24 DIAGNOSIS — K219 Gastro-esophageal reflux disease without esophagitis: Secondary | ICD-10-CM | POA: Diagnosis not present

## 2024-01-24 MED ORDER — PRUCALOPRIDE SUCCINATE 2 MG PO TABS: 2.0000 mg | ORAL_TABLET | Freq: Every day | ORAL | 11 refills | Status: AC

## 2024-01-24 NOTE — Patient Instructions (Signed)
 It was good to see you again today!  As discussed, lets try Motegrity 2 mg once daily to treat both constipation and nausea.  Hopefully, you will not need ondansetron  and MiraLAX  as much on this new medication.  New prescription Motegrity 2 mg tablets dispense 30 with 11 refills  Continue Protonix  40 mg daily.  Office visit in 3 months.  We will plan to set up a average risk screening colonoscopy in 2026

## 2024-01-24 NOTE — Progress Notes (Signed)
 Gastroenterology Progress Note    Primary Care Physician:  Shona Norleen PEDLAR, MD Primary Gastroenterologist:  Dr. Shaaron  Pre-Procedure History & Physical: HPI:  [name] [name] is a 58 y.o. female here for follow-up of gastroparesis and constipation must take MiraLAX  every day to facilitate bowel function 1-2 bowel movements daily she does not go she does take MiraLAX  also intermittent nausea on Ozempic.  Does not take Reglan .  Reflux well-controlled on Protonix .  Hemoglobin A1c is reportedly in the 5 range.  Due for average risk screening colonoscopy 2026.  Past Medical History:  Diagnosis Date   Anemia of chronic disease    Diabetes (HCC)    Gastroparesis    Grave's disease    diagnosed years ago/ now hypothyroidism   Hypertension    IBS (irritable colon syndrome)    constipation predominant    Past Surgical History:  Procedure Laterality Date   ABDOMINAL HYSTERECTOMY     attempted colonoscopy  03/2004   Dr. Ellinor Test--> prep was poor. Exam to 40 cm. No gross abnormalities. Internal hemorrhoids. Air contrast barium enema was normal.   CATARACT EXTRACTION     CHOLECYSTECTOMY  2010   COLONOSCOPY N/A 10/30/2014   RMR: normal. next colonoscopy in 2026.   ESOPHAGEAL DILATION N/A 10/30/2014   Procedure: ESOPHAGEAL DILATION;  Surgeon: Lamar CHRISTELLA Shaaron, MD;  Location: [facility];  Service: Endoscopy;  Laterality: N/A;   ESOPHAGOGASTRODUODENOSCOPY  01/2004   Dr. Loretto small polyps in postbulbar area ablated   ESOPHAGOGASTRODUODENOSCOPY N/A 10/30/2014   MFM:[id number]   ESOPHAGOGASTRODUODENOSCOPY (EGD) WITH PROPOFOL  N/A 06/13/2020    Surgeon: Shaaron Lamar CHRISTELLA, MD;  Normal exam   ESOPHAGOGASTRODUODENOSCOPY (EGD) WITH PROPOFOL  N/A 10/25/2022   Procedure: ESOPHAGOGASTRODUODENOSCOPY (EGD) WITH PROPOFOL ;  Surgeon: Shaaron Lamar CHRISTELLA, MD;  Location: [facility];  Service: Endoscopy;  Laterality: N/A;  1115AM, ASA 2   PLANTAR FASCIA SURGERY Right    S/P Hysterectomy      TONSILLECTOMY      TUBAL LIGATION      Prior to Admission medications   Medication Sig Start Date End Date Taking? Authorizing Provider  acetaminophen  (TYLENOL ) 325 MG tablet Take 650 mg by mouth every 6 (six) hours as needed for moderate pain.   Yes [provider]  atorvastatin  (LIPITOR) 20 MG tablet Take 20 mg by mouth daily.   Yes [provider]  blood glucose meter kit and supplies Use to check your blood sugar three times a day as directed. (FOR ICD-10 E10.9, E11.9). 09/07/18  Yes Ricky Fines, MD  cetirizine (ZYRTEC) 10 MG tablet Take 10 mg by mouth daily.   Yes [provider]  estradiol  (ESTRACE ) 1 MG tablet Take 1 mg by mouth daily. 09/30/20  Yes [provider]  levothyroxine  (SYNTHROID ) 75 MCG tablet Take 75 mcg by mouth daily before breakfast. 08/05/18  Yes [provider]  metoprolol  succinate (TOPROL -XL) 25 MG 24 hr tablet Take 25 mg by mouth daily. 05/20/20  Yes [provider]  ondansetron  (ZOFRAN -ODT) 4 MG disintegrating tablet DISSOLVE 1 TABLET IN MOUTH EVERY 8 HOURS AS NEEDED 01/16/24  Yes Cha Gomillion, Lamar CHRISTELLA, MD  OZEMPIC, 0.25 OR 0.5 MG/DOSE, 2 MG/3ML SOPN Inject 0.5 mg into the skin every Thursday.   Yes [provider]  pantoprazole  (PROTONIX ) 40 MG tablet Take 40 mg by mouth daily. 07/19/22  Yes Ezzard Sonny RAMAN, PA-C  polyethylene glycol powder (GLYCOLAX /MIRALAX ) 17 GM/SCOOP powder Take 17 g by mouth daily.   Yes [provider]  valsartan (DIOVAN) 40 MG tablet Take 40 mg by mouth daily.   Yes [provider]    Allergies as of 01/24/2024 - Review Complete 01/24/2024  Allergen Reaction Noted   Codeine Nausea And Vomiting    Insulin  glargine Itching 01/22/2022   Latex Itching 07/03/2021    Family History  Problem Relation Age of Onset   Other Father 9       hit by train   Heart disease Mother    Kidney disease Mother    Diabetes Maternal Aunt    Colon cancer Neg Hx     Social History   Socioeconomic  History   Marital status: Single    Spouse name: Not on file   Number of children: 1   Years of education: Not on file   Highest education level: Not on file  Occupational History   Occupation: unemployed since July 3    Employer: [employer]  Tobacco Use   Smoking status: Former    Types: E-cigarettes   Smokeless tobacco: Never  Vaping Use   Vaping status: Never Used  Substance and Sexual Activity   Alcohol use: Not Currently    Comment: occ   Drug use: No   Sexual activity: Not Currently    Birth control/protection: Surgical    Comment: hyst  Other Topics Concern   Not on file  Social History Narrative   1 grown healthy daughter   Social Drivers of Corporate investment banker Strain: Low Risk  (05/23/2023)   Overall Financial Resource Strain (CARDIA)    Difficulty of Paying Living Expenses: Not very hard  Food Insecurity: No Food Insecurity (05/23/2023)   Hunger Vital Sign    Worried About Running Out of Food in the Last Year: Never true    Ran Out of Food in the Last Year: Never true  Transportation Needs: No Transportation Needs (05/23/2023)   PRAPARE - Administrator, Civil Service (Medical): No    Lack of Transportation (Non-Medical): No  Physical Activity: Inactive (05/23/2023)   Exercise Vital Sign    Days of Exercise per Week: 0 days    Minutes of Exercise per Session: 0 min  Stress: No Stress Concern Present (05/23/2023)   [employer] of Occupational Health - Occupational Stress Questionnaire    Feeling of Stress : Not at all  Social Connections: Moderately Isolated (05/23/2023)   Social Connection and Isolation Panel    Frequency of Communication with Friends and Family: More than three times a week    Frequency of Social Gatherings with Friends and Family: Once a week    Attends Religious Services: Never    Database administrator or Organizations: No    Attends Banker Meetings: Never    Marital Status: Married   Catering manager Violence: Not At Risk (05/23/2023)   Humiliation, Afraid, Rape, and Kick questionnaire    Fear of Current or Ex-Partner: No    Emotionally Abused: No    Physically Abused: No    Sexually Abused: No    Review of Systems   See HPI, otherwise negative ROS  Physical Exam: BP 139/74 (BP Location: Right Arm, Patient Position: Sitting, Cuff Size: Normal)   Pulse 67   Temp (!) 97 F (36.1 C) (Oral)   Ht 5' (1.524 m)   Wt 146 lb (66.2 kg)   BMI 28.51 kg/m  General:   Alert,  Well-developed, well-nourished, pleasant and cooperative in NAD Neck:  Supple; no masses  or thyromegaly. No significant cervical adenopathy. Lungs:  Clear throughout to auscultation.   No wheezes, crackles, or rhonchi. No acute distress. Heart:  Regular rate and rhythm; no murmurs, clicks, rubs,  or gallops. Abdomen: Non-distended, normal bowel sounds.  Soft and nontender without appreciable mass or hepatosplenomegaly.    Impression/Plan:   58 year old lady with fairly well-controlled diabetes on Ozempic with chronic nausea in the setting of gastroparesis also chronic constipation.  No alarm symptoms at this time GERD well-controlled with Protonix .  No history of QT prolongation.  She may benefit from going on Motegrity for the not only efficacy and constipation but also promotion of gastric emptying.  Recommendations:  Lets try Motegrity 2 mg once daily to treat both constipation and nausea.  Hopefully, you will not need ondansetron  and MiraLAX  as much on this new medication.  New prescription Motegrity 2 mg tablets dispense 30 with 11 refills  Continue Protonix  40 mg daily.  Office visit in 3 months.  We will plan to set up a average risk screening colonoscopy in 2026  Notice: This dictation was prepared with Dragon dictation along with smaller phrase technology. Any transcriptional errors that result from this process are unintentional and may not be corrected upon review.

## 2024-02-17 ENCOUNTER — Encounter: Payer: Self-pay | Admitting: *Deleted

## 2024-02-17 NOTE — Progress Notes (Signed)
 SAMATHA ANSPACH                                          MRN: [id number]   02/17/2024   The [facility] Quality Team Specialist reviewed this patient medical record for the purposes of chart review for care gap closure. The following were reviewed: chart review for care gap closure-glycemic status assessment.    [facility] Quality Team

## 2024-03-15 DIAGNOSIS — L299 Pruritus, unspecified: Secondary | ICD-10-CM | POA: Diagnosis not present

## 2024-03-15 DIAGNOSIS — B3731 Acute candidiasis of vulva and vagina: Secondary | ICD-10-CM | POA: Diagnosis not present

## 2024-04-05 DIAGNOSIS — E118 Type 2 diabetes mellitus with unspecified complications: Secondary | ICD-10-CM | POA: Diagnosis not present

## 2024-04-13 ENCOUNTER — Other Ambulatory Visit: Payer: Self-pay | Admitting: Internal Medicine

## 2024-04-13 DIAGNOSIS — K3184 Gastroparesis: Secondary | ICD-10-CM

## 2024-05-07 ENCOUNTER — Other Ambulatory Visit (HOSPITAL_COMMUNITY)
Admission: RE | Admit: 2024-05-07 | Discharge: 2024-05-07 | Disposition: A | Discharge status: Home / Self Care | Source: Ambulatory Visit | Attending: Nephrology | Admitting: Nephrology

## 2024-05-07 DIAGNOSIS — E211 Secondary hyperparathyroidism, not elsewhere classified: Secondary | ICD-10-CM | POA: Insufficient documentation

## 2024-05-07 DIAGNOSIS — D631 Anemia in chronic kidney disease: Secondary | ICD-10-CM | POA: Diagnosis not present

## 2024-05-07 DIAGNOSIS — N189 Chronic kidney disease, unspecified: Secondary | ICD-10-CM | POA: Insufficient documentation

## 2024-05-07 DIAGNOSIS — R809 Proteinuria, unspecified: Secondary | ICD-10-CM | POA: Diagnosis not present

## 2024-05-07 DIAGNOSIS — E559 Vitamin D deficiency, unspecified: Secondary | ICD-10-CM | POA: Insufficient documentation

## 2024-05-07 DIAGNOSIS — I129 Hypertensive chronic kidney disease with stage 1 through stage 4 chronic kidney disease, or unspecified chronic kidney disease: Secondary | ICD-10-CM | POA: Diagnosis present

## 2024-05-07 DIAGNOSIS — E1122 Type 2 diabetes mellitus with diabetic chronic kidney disease: Secondary | ICD-10-CM | POA: Diagnosis present

## 2024-05-07 LAB — RENAL FUNCTION PANEL
Albumin: 4.3 g/dL (ref 3.5–5.0)
Anion gap: 11 (ref 5–15)
BUN: 16 mg/dL (ref 6–20)
CO2: 24 mmol/L (ref 22–32)
Calcium: 9.4 mg/dL (ref 8.9–10.3)
Chloride: 106 mmol/L (ref 98–111)
Creatinine, Ser: 1.8 mg/dL — ABNORMAL HIGH (ref 0.44–1.00)
GFR, Estimated: 32 mL/min — ABNORMAL LOW
Glucose, Bld: 74 mg/dL (ref 70–99)
Phosphorus: 3.4 mg/dL (ref 2.5–4.6)
Potassium: 4.8 mmol/L (ref 3.5–5.1)
Sodium: 141 mmol/L (ref 135–145)

## 2024-05-07 LAB — CBC
HCT: 35.5 % — ABNORMAL LOW (ref 36.0–46.0)
Hemoglobin: 10.8 g/dL — ABNORMAL LOW (ref 12.0–15.0)
MCH: 28.4 pg (ref 26.0–34.0)
MCHC: 30.4 g/dL (ref 30.0–36.0)
MCV: 93.4 fL (ref 80.0–100.0)
Platelets: 266 K/uL (ref 150–400)
RBC: 3.8 MIL/uL — ABNORMAL LOW (ref 3.87–5.11)
RDW: 13.3 % (ref 11.5–15.5)
WBC: 5.6 K/uL (ref 4.0–10.5)
nRBC: 0 % (ref 0.0–0.2)

## 2024-05-07 LAB — VITAMIN D 25 HYDROXY (VIT D DEFICIENCY, FRACTURES): Vit D, 25-Hydroxy: 26.5 ng/mL — ABNORMAL LOW (ref 30–100)

## 2024-05-08 LAB — MICROALBUMIN / CREATININE URINE RATIO
Creatinine, Urine: 210.6 mg/dL
Microalb Creat Ratio: 4 mg/g{creat} (ref 0–29)
Microalb, Ur: 8 ug/mL — ABNORMAL HIGH

## 2024-05-11 ENCOUNTER — Other Ambulatory Visit: Payer: Self-pay | Admitting: Internal Medicine

## 2024-05-11 DIAGNOSIS — K3184 Gastroparesis: Secondary | ICD-10-CM
